# Patient Record
Sex: Female | Born: 1935 | Race: White | Hispanic: No | Smoking: Never smoker
Health system: Southern US, Community
[De-identification: ages and names within clinical notes are randomized; demographics above are authoritative.]

## PROBLEM LIST (undated history)

## (undated) DIAGNOSIS — H269 Unspecified cataract: Secondary | ICD-10-CM

## (undated) DIAGNOSIS — I10 Essential (primary) hypertension: Secondary | ICD-10-CM

## (undated) DIAGNOSIS — T7840XA Allergy, unspecified, initial encounter: Secondary | ICD-10-CM

## (undated) DIAGNOSIS — I2510 Atherosclerotic heart disease of native coronary artery without angina pectoris: Secondary | ICD-10-CM

## (undated) DIAGNOSIS — M810 Age-related osteoporosis without current pathological fracture: Secondary | ICD-10-CM

## (undated) DIAGNOSIS — R011 Cardiac murmur, unspecified: Secondary | ICD-10-CM

## (undated) DIAGNOSIS — C801 Malignant (primary) neoplasm, unspecified: Secondary | ICD-10-CM

## (undated) DIAGNOSIS — M1990 Unspecified osteoarthritis, unspecified site: Secondary | ICD-10-CM

## (undated) DIAGNOSIS — I219 Acute myocardial infarction, unspecified: Secondary | ICD-10-CM

## (undated) HISTORY — DX: Unspecified cataract: H26.9

## (undated) HISTORY — PX: SMALL INTESTINE SURGERY: SHX150

## (undated) HISTORY — DX: Cardiac murmur, unspecified: R01.1

## (undated) HISTORY — DX: Allergy, unspecified, initial encounter: T78.40XA

## (undated) HISTORY — DX: Acute myocardial infarction, unspecified: I21.9

## (undated) HISTORY — PX: FRACTURE SURGERY: SHX138

## (undated) HISTORY — DX: Malignant (primary) neoplasm, unspecified: C80.1

## (undated) HISTORY — DX: Unspecified osteoarthritis, unspecified site: M19.90

## (undated) HISTORY — DX: Age-related osteoporosis without current pathological fracture: M81.0

## (undated) HISTORY — PX: COLON SURGERY: SHX602

---

## 1997-07-02 HISTORY — PX: ANKLE FRACTURE SURGERY: SHX122

## 2010-12-19 DIAGNOSIS — N814 Uterovaginal prolapse, unspecified: Secondary | ICD-10-CM | POA: Insufficient documentation

## 2010-12-19 DIAGNOSIS — D126 Benign neoplasm of colon, unspecified: Secondary | ICD-10-CM | POA: Insufficient documentation

## 2010-12-19 DIAGNOSIS — M159 Polyosteoarthritis, unspecified: Secondary | ICD-10-CM | POA: Insufficient documentation

## 2013-02-04 DIAGNOSIS — M810 Age-related osteoporosis without current pathological fracture: Secondary | ICD-10-CM | POA: Insufficient documentation

## 2017-10-13 ENCOUNTER — Encounter: Payer: Self-pay | Admitting: Emergency Medicine

## 2017-10-13 ENCOUNTER — Other Ambulatory Visit: Payer: Self-pay

## 2017-10-13 ENCOUNTER — Emergency Department
Admission: EM | Admit: 2017-10-13 | Discharge: 2017-10-13 | Disposition: A | Discharge status: Home / Self Care | Payer: Medicare Other | Attending: Emergency Medicine | Admitting: Emergency Medicine

## 2017-10-13 ENCOUNTER — Emergency Department: Payer: Medicare Other

## 2017-10-13 DIAGNOSIS — W208XXA Other cause of strike by thrown, projected or falling object, initial encounter: Secondary | ICD-10-CM | POA: Insufficient documentation

## 2017-10-13 DIAGNOSIS — Y92513 Shop (commercial) as the place of occurrence of the external cause: Secondary | ICD-10-CM | POA: Diagnosis not present

## 2017-10-13 DIAGNOSIS — S81812A Laceration without foreign body, left lower leg, initial encounter: Secondary | ICD-10-CM | POA: Diagnosis not present

## 2017-10-13 DIAGNOSIS — Y9389 Activity, other specified: Secondary | ICD-10-CM | POA: Diagnosis not present

## 2017-10-13 DIAGNOSIS — Z23 Encounter for immunization: Secondary | ICD-10-CM | POA: Insufficient documentation

## 2017-10-13 DIAGNOSIS — Y999 Unspecified external cause status: Secondary | ICD-10-CM | POA: Insufficient documentation

## 2017-10-13 MED ORDER — LIDOCAINE HCL (PF) 1 % IJ SOLN
INTRAMUSCULAR | Status: AC
  Administered 2017-10-13: 5 mL
  Filled 2017-10-13: qty 10

## 2017-10-13 MED ORDER — TETANUS-DIPHTH-ACELL PERTUSSIS 5-2.5-18.5 LF-MCG/0.5 IM SUSP
0.5000 mL | Freq: Once | INTRAMUSCULAR | Status: AC
  Administered 2017-10-13: 0.5 mL via INTRAMUSCULAR
  Filled 2017-10-13: qty 0.5

## 2017-10-13 NOTE — ED Notes (Signed)
CMS intact to left distal extremity, pt has hx of left ankle break with hardware implanted for repair, pt reports left ankle is typically swollen s/p fracture repair

## 2017-10-13 NOTE — ED Triage Notes (Signed)
Patient presents to Emergency Department via [facility] from [employer] with complaints of laceration to left lower medial aspect of shin.  Per pt: as pt was retrieving a printer from a upper shelf the box fell onto pt causing the laceration, pt denies fall, strike to head or LOC.  Pt reports taking dailey low dose ASA.  Pt is alert and oriented x 4, denies pain and NAD.

## 2017-10-13 NOTE — Discharge Instructions (Signed)
Do not get the sutured area wet for 24 hours. After 24 hours, shower/bathe as usual and pat the area dry. Change the bandage 2 times per day and apply antibiotic ointment. Leave open to air when at no risk of getting the area dirty, but cover at night before bed.   

## 2017-10-13 NOTE — ED Notes (Signed)
Pt with left shin bandaged and has 4x4s with ACE wrap applied, bleeding controlled

## 2017-10-13 NOTE — ED Provider Notes (Signed)
[facility] Emergency Department Provider Note  ____________________________________________  Time seen: Approximately 10:02 PM  I have reviewed the triage vital signs and the nursing notes.   HISTORY  Chief Complaint Extremity Laceration   HPI Alexis Perez is a 84 y.o. female who presents to the emergency department for treatment and evaluation of a laceration to the left pretibial area that she sustained while attempting to get a printer off the shelf at [employer]. She states it slipped out of her hand and slid down her leg, causing the laceration. She has a history of ankle fracture with ORIF and ankle is more swollen than usual after the injury. She is able to bear weight.   History reviewed. No pertinent past medical history.  There are no active problems to display for this patient.   Past Surgical History:  Procedure Laterality Date  . ANKLE FRACTURE SURGERY Left 1999    Prior to Admission medications   Not on File    Allergies Fentanyl and Sodium pentobarbital [pentobarbital]  History reviewed. No pertinent family history.  Social History Social History   Tobacco Use  . Smoking status: Never Smoker  . Smokeless tobacco: Never Used  Substance Use Topics  . Alcohol use: Never    Frequency: Never  . Drug use: Never    Review of Systems  Constitutional: Negative for fever. Respiratory: Negative for cough or shortness of breath.  Musculoskeletal: Negative for myalgias Skin: Positive for laceration. Neurological: Negative for numbness or paresthesias. ____________________________________________   PHYSICAL EXAM:  VITAL SIGNS: ED Triage Vitals  Enc Vitals Group     BP 10/13/17 1950 (!) 173/87     Pulse Rate 10/13/17 1950 81     Resp 10/13/17 1950 18     Temp 10/13/17 1950 (!) 97.5 F (36.4 C)     Temp Source 10/13/17 1950 Oral     SpO2 10/13/17 1950 99 %     Weight 10/13/17 1951 134 lb (60.8 kg)     Height 10/13/17  1951 5\' 2"  (1.575 m)     Head Circumference --      Peak Flow --      Pain Score 10/13/17 1951 0     Pain Loc --      Pain Edu? --      Excl. in GC? --      Constitutional: Well appearing. Eyes: Conjunctivae are clear without discharge or drainage. Nose: No rhinorrhea noted. Mouth/Throat: Airway is patent.  Neck: No stridor. Unrestricted range of motion observed.  Cardiovascular: Capillary refill is <3 seconds.  Respiratory: Respirations are even and unlabored.. Musculoskeletal: Unrestricted range of motion observed. Neurologic: Awake, alert, and oriented x 4.  Skin:  8cm laceration to the pretibial area of the left lower leg.  ____________________________________________   LABS (all labs ordered are listed, but only abnormal results are displayed)  Labs Reviewed - No data to display ____________________________________________  EKG  Not indicated. ____________________________________________  RADIOLOGY  Image of the left ankle shows postsurgical changes of the distal fibula, otherwise no acute bony abnormality.  There is some soft tissue swelling as well per radiology. ____________________________________________   PROCEDURES  .Marland Kitchen.Laceration Repair Date/Time: 10/13/2017 10:07 PM Performed by: Chinita Pesterriplett, Vera Furniss B, FNP Authorized by: Chinita Pesterriplett, Macari Zalesky B, FNP   Consent:    Consent obtained:  Verbal   Consent given by:  Patient   Risks discussed:  Infection, pain, retained foreign body, poor cosmetic result and poor wound healing Anesthesia (see MAR for exact dosages):  Anesthesia method:  Local infiltration   Local anesthetic:  Lidocaine 1% w/o epi Laceration details:    Location:  Leg   Leg location:  L lower leg   Length (cm):  8 Repair type:    Repair type:  Complex Pre-procedure details:    Preparation:  Patient was prepped and draped in usual sterile fashion Exploration:    Hemostasis achieved with:  Direct pressure   Wound exploration: entire depth of  wound probed and visualized     Contaminated: no   Treatment:    Area cleansed with:  Saline and Betadine   Amount of cleaning:  Extensive   Irrigation solution:  Sterile saline   Irrigation volume:    Irrigation method:  Syringe   Visualized foreign bodies/material removed: no     Debridement:  Minimal Subcutaneous repair:    Suture size:  4-0   Suture technique:  Simple interrupted   Number of sutures:  6 Skin repair:    Repair method:  Sutures   Suture size:  4-0   Suture technique:  Simple interrupted   Number of sutures:  14 Approximation:    Approximation:  Close Post-procedure details:    Dressing:  Sterile dressing and bulky dressing   Patient tolerance of procedure:  Tolerated well, no immediate complications Comments:     Light compression applied to the sutured area via [place]   ____________________________________________   INITIAL IMPRESSION / ASSESSMENT AND PLAN / ED COURSE  Alexis Perez is a 84 y.o. female who presents to the emergency department for treatment and evaluation after sustaining a laceration to the left pretibial area just prior to arrival.  Images of the left ankle were negative for acute bony abnormality.  Multilayer repair performed with good cosmetic results. No active bleeding post procedure, however area bleeds easily with movement or pressure. A light compression dressing applied. Granddaughter and patient were given specific wound care instructions. Sutures to be removed in about 12 days by primary care provider. She was advised to return to the ER for symptoms of concern if unable to see her PCP.  Medications  Tdap (BOOSTRIX) injection 0.5 mL (has no administration in time range)  lidocaine (PF) (XYLOCAINE) 1 % injection (has no administration in time range)     Pertinent labs & imaging results that were available during my care of the patient were reviewed by me and considered in my medical decision making (see chart for  details). ____________________________________________   FINAL CLINICAL IMPRESSION(S) / ED DIAGNOSES  Final diagnoses:  Laceration of left lower extremity, initial encounter    ED Discharge Orders    None       Note:  This document was prepared using Dragon voice recognition software and may include unintentional dictation errors.    Chinita Pester, FNP 10/13/17 2213    Arnaldo Natal, MD 10/13/17 [phone]

## 2018-08-21 ENCOUNTER — Encounter: Payer: Self-pay | Admitting: Emergency Medicine

## 2018-08-21 ENCOUNTER — Other Ambulatory Visit: Payer: Self-pay

## 2018-08-21 ENCOUNTER — Ambulatory Visit
Admission: EM | Admit: 2018-08-21 | Discharge: 2018-08-21 | Disposition: A | Discharge status: Home / Self Care | Payer: Medicare Other | Attending: Emergency Medicine | Admitting: Emergency Medicine

## 2018-08-21 DIAGNOSIS — R319 Hematuria, unspecified: Secondary | ICD-10-CM

## 2018-08-21 DIAGNOSIS — I10 Essential (primary) hypertension: Secondary | ICD-10-CM | POA: Diagnosis not present

## 2018-08-21 DIAGNOSIS — N390 Urinary tract infection, site not specified: Secondary | ICD-10-CM | POA: Diagnosis not present

## 2018-08-21 HISTORY — DX: Essential (primary) hypertension: I10

## 2018-08-21 LAB — URINALYSIS, COMPLETE (UACMP) WITH MICROSCOPIC
GLUCOSE, UA: NEGATIVE mg/dL
Ketones, ur: NEGATIVE mg/dL
Nitrite: POSITIVE — AB
PH: 6 (ref 5.0–8.0)
Protein, ur: >300 mg/dL — AB
SPECIFIC GRAVITY, URINE: 1.03 (ref 1.005–1.030)
Squamous Epithelial / LPF: NONE SEEN (ref 0–5)
WBC, UA: >50 WBC/hpf (ref 0–5)

## 2018-08-21 MED ORDER — CIPROFLOXACIN HCL 500 MG PO TABS: 500.0000 mg | ORAL_TABLET | Freq: Two times a day (BID) | ORAL | 0 refills | Status: AC

## 2018-08-21 NOTE — ED Provider Notes (Signed)
[facility] ____________________________________________  Time seen: Approximately 11:07 AM  I have reviewed the triage vital signs and the nursing notes.   HISTORY  Chief Complaint Urinary Frequency and Urinary Urgency   HPI Alexis Perez is a 85 y.o. female presenting for evaluation of urinary frequency and urinary urgency present for the last several days.  Patient reports that she does not often get urinary tract infections, but states that she did have one in January that was treated.  Reports that she was treated with Keflex 3 times a day for 7 days, and states that her symptoms did resolve, but returned.  Denies known trigger.  Denies any changes.  Continues to eat and drink well.  Denies any incontinence.  Normal bowel habits.  Denies accompanying abdominal pain, back pain, burning with urination or vaginal complaints.  No recent fevers.  Denies chest pain or shortness of breath.  Reports otherwise doing well.  Denies other recent sickness.  Denies aggravating or alleviating factors.  Denies renal insufficiency.  Atha Starks, MD: PCP   Past Medical History:  Diagnosis Date  . Hypertension     There are no active problems to display for this patient.   Past Surgical History:  Procedure Laterality Date  . ANKLE FRACTURE SURGERY Left 1999     No current facility-administered medications for this encounter.   Current Outpatient Medications:  .  amLODipine (NORVASC) 5 MG tablet, Take by mouth., Disp: , Rfl:  .  atenolol (TENORMIN) 100 MG tablet, TAKE 1 TABLET (100 MG TOTAL) BY MOUTH DAILY, Disp: , Rfl:  .  diclofenac sodium (VOLTAREN) 1 % GEL, Apply topically., Disp: , Rfl:  .  losartan (COZAAR) 100 MG tablet, Take by mouth., Disp: , Rfl:  .  Calcium-Vitamin D-Vitamin K 500-100-40 MG-UNT-MCG CHEW, Chew by mouth., Disp: , Rfl:  .  ciprofloxacin (CIPRO) 500 MG tablet, Take 1 tablet (500 mg total) by mouth 2 (two) times daily for 7 days., Disp: 14 tablet, Rfl:  0 .  meloxicam (MOBIC) 7.5 MG tablet, Take by mouth., Disp: , Rfl:   Allergies Fentanyl and Sodium pentobarbital [pentobarbital]  History reviewed. No pertinent family history.  Social History Social History   Tobacco Use  . Smoking status: Never Smoker  . Smokeless tobacco: Never Used  Substance Use Topics  . Alcohol use: Never    Frequency: Never  . Drug use: Never    Review of Systems Constitutional: No fever Cardiovascular: Denies chest pain. Respiratory: Denies shortness of breath. Gastrointestinal: No abdominal pain.  No nausea, no vomiting.  No diarrhea.  Genitourinary: positive for dysuria. Musculoskeletal: Negative for back pain. Skin: Negative for rash.   ____________________________________________   PHYSICAL EXAM:  VITAL SIGNS: ED Triage Vitals  Enc Vitals Group     BP 08/21/18 1027 (!) 144/75     Pulse Rate 08/21/18 1027 63     Resp 08/21/18 1027 18     Temp 08/21/18 1027 98.5 F (36.9 C)     Temp Source 08/21/18 1027 Oral     SpO2 08/21/18 1027 99 %     Weight 08/21/18 1025 135 lb (61.2 kg)     Height 08/21/18 1025 5\' 2"  (1.575 m)     Head Circumference --      Peak Flow --      Pain Score 08/21/18 1024 0     Pain Loc --      Pain Edu? --      Excl. in GC? --  Constitutional: Alert and oriented. Well appearing and in no acute distress. ENT      Head: Normocephalic and atraumatic. Cardiovascular: Normal rate, regular rhythm. Grossly normal heart sounds.  Good peripheral circulation. Respiratory: Normal respiratory effort without tachypnea nor retractions. Breath sounds are clear and equal bilaterally. No wheezes, rales, rhonchi. Gastrointestinal: Soft and nontender. No CVA tenderness. Musculoskeletal:No midline cervical, thoracic or lumbar tenderness to palpation. Neurologic:  Normal speech and language. Speech is normal. No gait instability.  Skin:  Skin is warm, dry and intact. No rash noted. Psychiatric: Mood and affect are normal.  Speech and behavior are normal. Patient exhibits appropriate insight and judgment   ___________________________________________   LABS (all labs ordered are listed, but only abnormal results are displayed)  Labs Reviewed  URINALYSIS, COMPLETE (UACMP) WITH MICROSCOPIC - Abnormal; Notable for the following components:      Result Value   APPearance CLOUDY (*)    Hgb urine dipstick LARGE (*)    Bilirubin Urine SMALL (*)    Protein, ur >300 (*)    Nitrite POSITIVE (*)    Leukocytes,Ua LARGE (*)    Bacteria, UA MANY (*)    All other components within normal limits  URINE CULTURE    Via care everywhere most recent labs in September 2019 reviewed with a creatinine of 0.57.  Creatinine clearance calculated at 61.   PROCEDURES Procedures    INITIAL IMPRESSION / ASSESSMENT AND PLAN / ED COURSE  Pertinent labs & imaging results that were available during my care of the patient were reviewed by me and considered in my medical decision making (see chart for details).  Well-appearing patient.  No acute distress.  Urinalysis reviewed, positive UTI.  Patient recently treated approximately 1 month ago with Keflex.  Creatinine clearance calculated and 61.  Will treat patient with oral Cipro twice daily x7 days.  We will also await urine culture.  Recommend for follow-up with primary care in 7 to 10 days for recheck of urine to ensure clearance as well as discussed sooner evaluation for no improvement or worsening concerns.  Fluids, supportive care. Discussed indication, risks and benefits of medications with patient.  Discussed follow up with Primary care physician this week. Discussed follow up and return parameters including no resolution or any worsening concerns. Patient verbalized understanding and agreed to plan.   ____________________________________________   FINAL CLINICAL IMPRESSION(S) / ED DIAGNOSES  Final diagnoses:  Urinary tract infection with hematuria, site unspecified      ED Discharge Orders         Ordered    ciprofloxacin (CIPRO) 500 MG tablet  2 times daily     08/21/18 1144           Note: This dictation was prepared with Dragon dictation along with smaller phrase technology. Any transcriptional errors that result from this process are unintentional.     Renford Dills, NP 08/21/18 1221

## 2018-08-21 NOTE — Discharge Instructions (Addendum)
Take medication as prescribed. Rest. Drink plenty of fluids.   Follow up with your primary care physician in one week. Sooner if not better.    Return to Urgent care for new or worsening concerns.

## 2018-08-21 NOTE — ED Triage Notes (Signed)
Patient c/o urinary frequency and urgency that started yesterday.  

## 2018-08-23 LAB — URINE CULTURE: Culture: >=100000 — AB

## 2018-08-25 ENCOUNTER — Telehealth (HOSPITAL_COMMUNITY): Payer: Self-pay | Admitting: Emergency Medicine

## 2018-08-25 NOTE — Telephone Encounter (Signed)
Urine culture was positive for Escherichia coli and was given cipro  at urgent care visit. Pt contacted and made aware, educated on completing antibiotic and to follow up if symptoms are persistent. Attempted call an

## 2021-06-24 DIAGNOSIS — E785 Hyperlipidemia, unspecified: Secondary | ICD-10-CM | POA: Insufficient documentation

## 2021-09-01 ENCOUNTER — Encounter: Payer: Medicare PPO | Attending: Internal Medicine | Admitting: *Deleted

## 2021-09-01 ENCOUNTER — Other Ambulatory Visit: Payer: Self-pay

## 2021-09-01 ENCOUNTER — Encounter: Payer: Self-pay | Admitting: *Deleted

## 2021-09-01 DIAGNOSIS — I214 Non-ST elevation (NSTEMI) myocardial infarction: Secondary | ICD-10-CM | POA: Insufficient documentation

## 2021-09-01 NOTE — Progress Notes (Signed)
Virtual orientation call completed today. shehas an appointment on Date: 09/11/2021  for EP eval and gym Orientation.  Documentation of diagnosis can be found in [facility]  Date: 08/09/2021 and 06/23/2021 .  ? ?

## 2021-09-11 ENCOUNTER — Other Ambulatory Visit: Payer: Self-pay

## 2021-09-11 VITALS — Ht 62.0 in | Wt 133.6 lb

## 2021-09-11 DIAGNOSIS — I214 Non-ST elevation (NSTEMI) myocardial infarction: Secondary | ICD-10-CM | POA: Diagnosis present

## 2021-09-11 NOTE — Patient Instructions (Addendum)
Patient Instructions ? ?Patient Details  ?Name: Alexis Perez ?MRN: [id number] ?Date of Birth: 10/16/1923 ?Referring Provider:  Christain SacramentoMoskowitz, Adam J, MD ? ?Below are your personal goals for exercise, nutrition, and risk factors. Our goal is to help you stay on track towards obtaining and maintaining these goals. We will be discussing your progress on these goals with you throughout the program. ? ?Initial Exercise Prescription: ? Initial Exercise Prescription - 09/11/21 1600   ? ?  ? Date of Initial Exercise RX and Referring Provider  ? Date 09/11/21   ? Referring Provider Cecile HearingMoskowitz   ?  ? Oxygen  ? Maintain Oxygen Saturation 88% or higher   ?  ? Treadmill  ? MPH 0.8   ? Grade 0   ? Minutes 15   ? METs 1.4   ?  ? REL-XR  ? Level 1   ? Speed 50   ? Minutes 15   ? METs 1.5   ?  ? T5 Nustep  ? Level 1   ? SPM 80   ? Minutes 15   ? METs 1.5   ?  ? Track  ? Laps 10   ? Minutes 15   ? METs 1.5   ?  ? Prescription Details  ? Frequency (times per week) 3   ? Duration Progress to 30 minutes of continuous aerobic without signs/symptoms of physical distress   ?  ? Intensity  ? THRR 40-80% of Max Heartrate 101-117   ? Ratings of Perceived Exertion 11-13   ? Perceived Dyspnea 0-4   ?  ? Resistance Training  ? Training Prescription Yes   ? Weight 3 lb   ? Reps 10-15   ? ?  ?  ? ?  ? ? ?Exercise Goals: ?Frequency: Be able to perform aerobic exercise two to three times per week in program working toward 2-5 days per week of home exercise. ? ?Intensity: Work with a perceived exertion of 11 (fairly light) - 15 (hard) while following your exercise prescription.  We will make changes to your prescription with you as you progress through the program. ?  ?Duration: Be able to do 30 to 45 minutes of continuous aerobic exercise in addition to a 5 minute warm-up and a 5 minute cool-down routine. ?  ?Nutrition Goals: ?Your personal nutrition goals will be established when you do your nutrition analysis with the dietician. ? ?The following are  general nutrition guidelines to follow: ?Cholesterol < 200mg /day ?Sodium < 1500mg /day ?Fiber: Women over 50 yrs - 21 grams per day ? ?Personal Goals: ? Personal Goals and Risk Factors at Admission - 09/11/21 1632   ? ?  ? Core Components/Risk Factors/Patient Goals on Admission  ?  Weight Management Yes   ? Intervention Weight Management: Develop a combined nutrition and exercise program designed to reach desired caloric intake, while maintaining appropriate intake of nutrient and fiber, sodium and fats, and appropriate energy expenditure required for the weight goal.;Weight Management: Provide education and appropriate resources to help participant work on and attain dietary goals.   ? Admit Weight 133 lb (60.3 kg)   ? Goal Weight: Long Term 128 lb (58.1 kg)   ? Expected Outcomes Short Term: Continue to assess and modify interventions until short term weight is achieved;Long Term: Adherence to nutrition and physical activity/exercise program aimed toward attainment of established weight goal;Weight Maintenance: Understanding of the daily nutrition guidelines, which includes 25-35% calories from fat, 7% or less cal from saturated fats, less than 200mg  cholesterol,  less than 1.5gm of sodium, & 5 or more servings of fruits and vegetables daily   ? Hypertension Yes   ? Intervention Provide education on lifestyle modifcations including regular physical activity/exercise, weight management, moderate sodium restriction and increased consumption of fresh fruit, vegetables, and low fat dairy, alcohol moderation, and smoking cessation.;Monitor prescription use compliance.   ? Expected Outcomes Short Term: Continued assessment and intervention until BP is < 140/69mm HG in hypertensive participants. < 130/80mm HG in hypertensive participants with diabetes, heart failure or chronic kidney disease.;Long Term: Maintenance of blood pressure at goal levels.   ? Lipids Yes   ? Intervention Provide education and support for  participant on nutrition & aerobic/resistive exercise along with prescribed medications to achieve LDL 70mg , HDL >40mg .   ? Expected Outcomes Short Term: Participant states understanding of desired cholesterol values and is compliant with medications prescribed. Participant is following exercise prescription and nutrition guidelines.;Long Term: Cholesterol controlled with medications as prescribed, with individualized exercise RX and with personalized nutrition plan. Value goals: LDL < 70mg , HDL > 40 mg.   ? ?  ?  ? ?  ? ? ?Tobacco Use Initial Evaluation: ?Social History  ? ?Tobacco Use  ?Smoking Status Never  ?Smokeless Tobacco Never  ? ? ?Exercise Goals and Review: ? Exercise Goals   ? ? Row Name 09/11/21 1634  ?  ?  ?  ?  ?  ? Exercise Goals  ? Increase Physical Activity Yes      ? Intervention Provide advice, education, support and counseling about physical activity/exercise needs.;Develop an individualized exercise prescription for aerobic and resistive training based on initial evaluation findings, risk stratification, comorbidities and participant's personal goals.      ? Expected Outcomes Short Term: Attend rehab on a regular basis to increase amount of physical activity.;Long Term: Add in home exercise to make exercise part of routine and to increase amount of physical activity.;Long Term: Exercising regularly at least 3-5 days a week.      ? Increase Strength and Stamina Yes      ? Intervention Provide advice, education, support and counseling about physical activity/exercise needs.;Develop an individualized exercise prescription for aerobic and resistive training based on initial evaluation findings, risk stratification, comorbidities and participant's personal goals.      ? Expected Outcomes Short Term: Increase workloads from initial exercise prescription for resistance, speed, and METs.;Short Term: Perform resistance training exercises routinely during rehab and add in resistance training at home;Long  Term: Improve cardiorespiratory fitness, muscular endurance and strength as measured by increased METs and functional capacity (09/13/21)      ? Able to understand and use rate of perceived exertion (RPE) scale Yes      ? Intervention Provide education and explanation on how to use RPE scale      ? Expected Outcomes Short Term: Able to use RPE daily in rehab to express subjective intensity level;Long Term:  Able to use RPE to guide intensity level when exercising independently      ? Able to understand and use Dyspnea scale Yes      ? Intervention Provide education and explanation on how to use Dyspnea scale      ? Expected Outcomes Short Term: Able to use Dyspnea scale daily in rehab to express subjective sense of shortness of breath during exertion;Long Term: Able to use Dyspnea scale to guide intensity level when exercising independently      ? Knowledge and understanding of Target Heart Rate Range (THRR) Yes      ?  Intervention Provide education and explanation of THRR including how the numbers were predicted and where they are located for reference      ? Expected Outcomes Short Term: Able to state/look up THRR;Short Term: Able to use daily as guideline for intensity in rehab;Long Term: Able to use THRR to govern intensity when exercising independently      ? Able to check pulse independently Yes      ? Intervention Provide education and demonstration on how to check pulse in carotid and radial arteries.;Review the importance of being able to check your own pulse for safety during independent exercise      ? Expected Outcomes Short Term: Able to explain why pulse checking is important during independent exercise;Long Term: Able to check pulse independently and accurately      ? Understanding of Exercise Prescription Yes      ? Intervention Provide education, explanation, and written materials on patient's individual exercise prescription      ? Expected Outcomes Short Term: Able to explain program exercise  prescription;Long Term: Able to explain home exercise prescription to exercise independently      ? ?  ?  ? ?  ? ? ?Copy of goals given to participant. ?

## 2021-09-11 NOTE — Progress Notes (Signed)
Cardiac Individual Treatment Plan  Patient Details  Name: Alexis Perez MRN: [id number] Date of Birth: 1923/09/30 Referring Provider:   Flowsheet Row Cardiac Rehab from 09/11/2021 in [facility] Cardiac and Pulmonary Rehab  Referring Provider [name]       Initial Encounter Date:  Flowsheet Row Cardiac Rehab from 09/11/2021 in [facility] Cardiac and Pulmonary Rehab  Date 09/11/21       Visit Diagnosis: NSTEMI (non-ST elevation myocardial infarction) [facility])  Patient's Home Medications on Admission:  Current Outpatient Medications:    amLODipine (NORVASC) 10 MG tablet, Take by mouth., Disp: , Rfl:    aspirin EC 81 MG tablet, Take 81 mg by mouth daily. Swallow whole., Disp: , Rfl:    Calcium-Vitamin D-Vitamin K 500-100-40 MG-UNT-MCG CHEW, Chew by mouth., Disp: , Rfl:    Cholecalciferol 50 MCG (2000 UT) CAPS, Take by mouth., Disp: , Rfl:    clopidogrel (PLAVIX) 75 MG tablet, Take by mouth., Disp: , Rfl:    diclofenac Sodium (VOLTAREN) 1 % GEL, Apply topically., Disp: , Rfl:    ezetimibe (ZETIA) 10 MG tablet, Take by mouth., Disp: , Rfl:    isosorbide mononitrate (IMDUR) 30 MG 24 hr tablet, Take by mouth., Disp: , Rfl:    losartan (COZAAR) 100 MG tablet, Take by mouth. (Patient not taking: Reported on 09/01/2021), Disp: , Rfl:    meloxicam (MOBIC) 7.5 MG tablet, Take by mouth. (Patient not taking: Reported on 09/01/2021), Disp: , Rfl:    metoprolol succinate (TOPROL-XL) 50 MG 24 hr tablet, Take by mouth., Disp: , Rfl:    Multiple Vitamins-Minerals (PRESERVISION AREDS 2) CAPS, daily., Disp: , Rfl:    nitroGLYCERIN (NITROSTAT) 0.4 MG SL tablet, Place under the tongue., Disp: , Rfl:    Polyethyl Glycol-Propyl Glycol 0.4-0.3 % SOLN, Administer 1 drop to both eyes in the morning., Disp: , Rfl:   Past Medical History: Past Medical History:  Diagnosis Date   Hypertension     Tobacco Use: Social History   Tobacco Use  Smoking Status Never  Smokeless Tobacco Never    Labs: Recent Review Flowsheet  Data   There is no flowsheet data to display.      Exercise Target Goals: Exercise Program Goal: Individual exercise prescription set using results from initial 6 min walk test and THRR while considering  patients activity barriers and safety.   Exercise Prescription Goal: Initial exercise prescription builds to 30-45 minutes a day of aerobic activity, 2-3 days per week.  Home exercise guidelines will be given to patient during program as part of exercise prescription that the participant will acknowledge.   Education: Aerobic Exercise: - Group verbal and visual presentation on the components of exercise prescription. Introduces F.I.T.T principle from ACSM for exercise prescriptions.  Reviews F.I.T.T. principles of aerobic exercise including progression. Written material given at graduation.   Education: Resistance Exercise: - Group verbal and visual presentation on the components of exercise prescription. Introduces F.I.T.T principle from ACSM for exercise prescriptions  Reviews F.I.T.T. principles of resistance exercise including progression. Written material given at graduation.    Education: Exercise & Equipment Safety: - Individual verbal instruction and demonstration of equipment use and safety with use of the equipment. Flowsheet Row Cardiac Rehab from 09/11/2021 in [facility] Cardiac and Pulmonary Rehab  Date 09/11/21  Educator AS  Instruction Review Code 1- Verbalizes Understanding       Education: Exercise Physiology & General Exercise Guidelines: - Group verbal and written instruction with models to review the exercise physiology of the cardiovascular system and  associated critical values. Provides general exercise guidelines with specific guidelines to those with heart or lung disease.    Education: Flexibility, Balance, Mind/Body Relaxation: - Group verbal and visual presentation with interactive activity on the components of exercise prescription. Introduces F.I.T.T  principle from ACSM for exercise prescriptions. Reviews F.I.T.T. principles of flexibility and balance exercise training including progression. Also discusses the mind body connection.  Reviews various relaxation techniques to help reduce and manage stress (i.e. Deep breathing, progressive muscle relaxation, and visualization). Balance handout provided to take home. Written material given at graduation.   Activity Barriers & Risk Stratification:  Activity Barriers & Cardiac Risk Stratification - 09/01/21 1014       Activity Barriers & Cardiac Risk Stratification   Activity Barriers Assistive Device;Arthritis;Chest Pain/Angina    Cardiac Risk Stratification Moderate             6 Minute Walk:  6 Minute Walk     Row Name 09/11/21 1624         6 Minute Walk   Phase Initial     Distance 445 feet     Walk Time 5.75 minutes     # of Rest Breaks 1     MPH 0.9     METS 1.5     RPE 11     Perceived Dyspnea  0     VO2 Peak 0.1     Symptoms No     Resting HR 85 bpm     Resting BP 110/56     Resting Oxygen Saturation  99 %     Exercise Oxygen Saturation  during 6 min walk 99 %     Max Ex. HR 95 bpm     Max Ex. BP 128/64     2 Minute Post BP 112/64              Oxygen Initial Assessment:   Oxygen Re-Evaluation:   Oxygen Discharge (Final Oxygen Re-Evaluation):   Initial Exercise Prescription:  Initial Exercise Prescription - 09/11/21 1600       Date of Initial Exercise RX and Referring Provider   Date 09/11/21    Referring Provider Moskowitz      Oxygen   Maintain Oxygen Saturation 88% or higher      Treadmill   MPH 0.8    Grade 0    Minutes 15    METs 1.4      REL-XR   Level 1    Speed 50    Minutes 15    METs 1.5      T5 Nustep   Level 1    SPM 80    Minutes 15    METs 1.5      Track   Laps 10    Minutes 15    METs 1.5      Prescription Details   Frequency (times per week) 3    Duration Progress to 30 minutes of continuous aerobic  without signs/symptoms of physical distress      Intensity   THRR 40-80% of Max Heartrate 101-117    Ratings of Perceived Exertion 11-13    Perceived Dyspnea 0-4      Resistance Training   Training Prescription Yes    Weight 3 lb    Reps 10-15             Perform Capillary Blood Glucose checks as needed.  Exercise Prescription Changes:   Exercise Prescription Changes     Row Name 09/11/21 1600  Response to Exercise   Blood Pressure (Admit) 110/56       Blood Pressure (Exercise) 128/64       Blood Pressure (Exit) 112/64       Heart Rate (Admit) 85 bpm       Heart Rate (Exercise) 95 bpm       Heart Rate (Exit) 89 bpm       Oxygen Saturation (Admit) 99 %       Oxygen Saturation (Exercise) 99 %       Rating of Perceived Exertion (Exercise) 11       Perceived Dyspnea (Exercise) 0       Symptoms none                Exercise Comments:   Exercise Goals and Review:   Exercise Goals     Row Name 09/11/21 1634             Exercise Goals   Increase Physical Activity Yes       Intervention Provide advice, education, support and counseling about physical activity/exercise needs.;Develop an individualized exercise prescription for aerobic and resistive training based on initial evaluation findings, risk stratification, comorbidities and participant's personal goals.       Expected Outcomes Short Term: Attend rehab on a regular basis to increase amount of physical activity.;Long Term: Add in home exercise to make exercise part of routine and to increase amount of physical activity.;Long Term: Exercising regularly at least 3-5 days a week.       Increase Strength and Stamina Yes       Intervention Provide advice, education, support and counseling about physical activity/exercise needs.;Develop an individualized exercise prescription for aerobic and resistive training based on initial evaluation findings, risk stratification, comorbidities and participant's  personal goals.       Expected Outcomes Short Term: Increase workloads from initial exercise prescription for resistance, speed, and METs.;Short Term: Perform resistance training exercises routinely during rehab and add in resistance training at home;Long Term: Improve cardiorespiratory fitness, muscular endurance and strength as measured by increased METs and functional capacity ( )       Able to understand and use rate of perceived exertion (RPE) scale Yes       Intervention Provide education and explanation on how to use RPE scale       Expected Outcomes Short Term: Able to use RPE daily in rehab to express subjective intensity level;Long Term:  Able to use RPE to guide intensity level when exercising independently       Able to understand and use Dyspnea scale Yes       Intervention Provide education and explanation on how to use Dyspnea scale       Expected Outcomes Short Term: Able to use Dyspnea scale daily in rehab to express subjective sense of shortness of breath during exertion;Long Term: Able to use Dyspnea scale to guide intensity level when exercising independently       Knowledge and understanding of Target Heart Rate Range (THRR) Yes       Intervention Provide education and explanation of THRR including how the numbers were predicted and where they are located for reference       Expected Outcomes Short Term: Able to state/look up THRR;Short Term: Able to use daily as guideline for intensity in rehab;Long Term: Able to use THRR to govern intensity when exercising independently       Able to check pulse independently Yes       Intervention Provide  education and demonstration on how to check pulse in carotid and radial arteries.;Review the importance of being able to check your own pulse for safety during independent exercise       Expected Outcomes Short Term: Able to explain why pulse checking is important during independent exercise;Long Term: Able to check pulse independently and  accurately       Understanding of Exercise Prescription Yes       Intervention Provide education, explanation, and written materials on patient's individual exercise prescription       Expected Outcomes Short Term: Able to explain program exercise prescription;Long Term: Able to explain home exercise prescription to exercise independently                Exercise Goals Re-Evaluation :   Discharge Exercise Prescription (Final Exercise Prescription Changes):  Exercise Prescription Changes - 09/11/21 1600       Response to Exercise   Blood Pressure (Admit) 110/56    Blood Pressure (Exercise) 128/64    Blood Pressure (Exit) 112/64    Heart Rate (Admit) 85 bpm    Heart Rate (Exercise) 95 bpm    Heart Rate (Exit) 89 bpm    Oxygen Saturation (Admit) 99 %    Oxygen Saturation (Exercise) 99 %    Rating of Perceived Exertion (Exercise) 11    Perceived Dyspnea (Exercise) 0    Symptoms none             Nutrition:  Target Goals: Understanding of nutrition guidelines, daily intake of sodium 1500mg , cholesterol 200mg , calories 30% from fat and 7% or less from saturated fats, daily to have 5 or more servings of fruits and vegetables.  Education: All About Nutrition: -Group instruction provided by verbal, written material, interactive activities, discussions, models, and posters to present general guidelines for heart healthy nutrition including fat, fiber, MyPlate, the role of sodium in heart healthy nutrition, utilization of the nutrition label, and utilization of this knowledge for meal planning. Follow up email sent as well. Written material given at graduation.   Biometrics:  Pre Biometrics - 09/11/21 1632       Pre Biometrics   Height  (1.575 m)    Weight 133 lb 9.6 oz (60.6 kg)    BMI (Calculated) 24.43    Single Leg Stand 0 seconds              Nutrition Therapy Plan and Nutrition Goals:   Nutrition Assessments:  MEDIFICTS Score Key: ?70 Need to make  dietary changes  40-70 Heart Healthy Diet ? 40 Therapeutic Level Cholesterol Diet  Flowsheet Row Cardiac Rehab from 09/11/2021 in [facility] Cardiac and Pulmonary Rehab  Picture Your Plate Total Score on Admission 82      Picture Your Plate Scores: <16 Unhealthy dietary pattern with much room for improvement. 41-50 Dietary pattern unlikely to meet recommendations for good health and room for improvement. 51-60 More healthful dietary pattern, with some room for improvement.  >60 Healthy dietary pattern, although there may be some specific behaviors that could be improved.    Nutrition Goals Re-Evaluation:   Nutrition Goals Discharge (Final Nutrition Goals Re-Evaluation):   Psychosocial: Target Goals: Acknowledge presence or absence of significant depression and/or stress, maximize coping skills, provide positive support system. Participant is able to verbalize types and ability to use techniques and skills needed for reducing stress and depression.   Education: Stress, Anxiety, and Depression - Group verbal and visual presentation to define topics covered.  Reviews how body is impacted  by stress, anxiety, and depression.  Also discusses healthy ways to reduce stress and to treat/manage anxiety and depression.  Written material given at graduation.   Education: Sleep Hygiene -Provides group verbal and written instruction about how sleep can affect your health.  Define sleep hygiene, discuss sleep cycles and impact of sleep habits. Review good sleep hygiene tips.    Initial Review & Psychosocial Screening:  Initial Psych Review & Screening - 09/01/21 1015       Initial Review   Current issues with None Identified      Family Dynamics   Good Support System? Yes   6 daughters and a son, 3 daughters and a grandson lives with her.     Barriers   Psychosocial barriers to participate in program There are no identifiable barriers or psychosocial needs.;The patient should benefit from  training in stress management and relaxation.      Screening Interventions   Interventions Encouraged to exercise    Expected Outcomes Short Term goal: Utilizing psychosocial counselor, staff and physician to assist with identification of specific Stressors or current issues interfering with healing process. Setting desired goal for each stressor or current issue identified.;Long Term Goal: Stressors or current issues are controlled or eliminated.;Short Term goal: Identification and review with participant of any Quality of Life or Depression concerns found by scoring the questionnaire.;Long Term goal: The participant improves quality of Life and PHQ9 Scores as seen by post scores and/or verbalization of changes             Quality of Life Scores:   Quality of Life - 09/11/21 1622       Quality of Life   Select Quality of Life      Quality of Life Scores   Health/Function Pre 22.23 %    Socioeconomic Pre 24.69 %    Psych/Spiritual Pre 28.21 %    Family Pre 22.9 %    GLOBAL Pre 24.09 %            Scores of 19 and below usually indicate a poorer quality of life in these areas.  A difference of  2-3 points is a clinically meaningful difference.  A difference of 2-3 points in the total score of the Quality of Life Index has been associated with significant improvement in overall quality of life, self-image, physical symptoms, and general health in studies assessing change in quality of life.  PHQ-9: Recent Review Flowsheet Data     Depression screen [facility] 2/9 09/11/2021   Decreased Interest 0   Down, Depressed, Hopeless 0   PHQ - 2 Score 0   Altered sleeping 0   Tired, decreased energy 2   Change in appetite 0   Feeling bad or failure about yourself  0   Trouble concentrating 0   Moving slowly or fidgety/restless 0   Suicidal thoughts 0   PHQ-9 Score 2   Difficult doing work/chores Somewhat difficult      Interpretation of Total Score  Total Score Depression Severity:   1-4 = Minimal depression, 5-9 = Mild depression, 10-14 = Moderate depression, 15-19 = Moderately severe depression, 20-27 = Severe depression   Psychosocial Evaluation and Intervention:  Psychosocial Evaluation - 09/01/21 1023       Psychosocial Evaluation & Interventions   Interventions Encouraged to exercise with the program and follow exercise prescription    Comments Alexis Perez has no barriers to attending the program. She lives with 3 daughters and a grandson. She has 3 more daughters  and a son that are nearby.  They are all there for her as needed. She is looking forward to attending the program.  She has no depression,stress nor anxiety concerns.    Expected Outcomes STG Alexis Perez will attend akk scheduled sessions and progress with her exercise without anginal symptoms.   LTG Alexis Perez will continue with the progression after disccharge    Continue Psychosocial Services  Follow up required by staff             Psychosocial Re-Evaluation:   Psychosocial Discharge (Final Psychosocial Re-Evaluation):   Vocational Rehabilitation: Provide vocational rehab assistance to qualifying candidates.   Vocational Rehab Evaluation & Intervention:   Education: Education Goals: Education classes will be provided on a variety of topics geared toward better understanding of heart health and risk factor modification. Participant will state understanding/return demonstration of topics presented as noted by education test scores.  Learning Barriers/Preferences:   General Cardiac Education Topics:  AED/CPR: - Group verbal and written instruction with the use of models to demonstrate the basic use of the AED with the basic ABC's of resuscitation.   Anatomy and Cardiac Procedures: - Group verbal and visual presentation and models provide information about basic cardiac anatomy and function. Reviews the testing methods done to diagnose heart disease and the outcomes of the test results. Describes the  treatment choices: Medical Management, Angioplasty, or Coronary Bypass Surgery for treating various heart conditions including Myocardial Infarction, Angina, Valve Disease, and Cardiac Arrhythmias.  Written material given at graduation.   Medication Safety: - Group verbal and visual instruction to review commonly prescribed medications for heart and lung disease. Reviews the medication, class of the drug, and side effects. Includes the steps to properly store meds and maintain the prescription regimen.  Written material given at graduation.   Intimacy: - Group verbal instruction through game format to discuss how heart and lung disease can affect sexual intimacy. Written material given at graduation..   Know Your Numbers and Heart Failure: - Group verbal and visual instruction to discuss disease risk factors for cardiac and pulmonary disease and treatment options.  Reviews associated critical values for Overweight/Obesity, Hypertension, Cholesterol, and Diabetes.  Discusses basics of heart failure: signs/symptoms and treatments.  Introduces Heart Failure Zone chart for action plan for heart failure.  Written material given at graduation. Flowsheet Row Cardiac Rehab from 09/11/2021 in [facility] Cardiac and Pulmonary Rehab  Education need identified 09/11/21       Infection Prevention: - Provides verbal and written material to individual with discussion of infection control including proper hand washing and proper equipment cleaning during exercise session. Flowsheet Row Cardiac Rehab from 09/11/2021 in [facility] Cardiac and Pulmonary Rehab  Date 09/11/21  Educator AS  Instruction Review Code 1- Verbalizes Understanding       Falls Prevention: - Provides verbal and written material to individual with discussion of falls prevention and safety. Flowsheet Row Cardiac Rehab from 09/11/2021 in [facility] Cardiac and Pulmonary Rehab  Date 09/11/21  Educator AS  Instruction Review Code 1- Verbalizes  Understanding       Other: -Provides group and verbal instruction on various topics (see comments)   Knowledge Questionnaire Score:  Knowledge Questionnaire Score - 09/11/21 1622       Knowledge Questionnaire Score   Pre Score 23/26             Core Components/Risk Factors/Patient Goals at Admission:  Personal Goals and Risk Factors at Admission - 09/11/21 1632       Core  Components/Risk Factors/Patient Goals on Admission    Weight Management Yes    Intervention Weight Management: Develop a combined nutrition and exercise program designed to reach desired caloric intake, while maintaining appropriate intake of nutrient and fiber, sodium and fats, and appropriate energy expenditure required for the weight goal.;Weight Management: Provide education and appropriate resources to help participant work on and attain dietary goals.    Admit Weight 133 lb (60.3 kg)    Goal Weight: Long Term 128 lb (58.1 kg)    Expected Outcomes Short Term: Continue to assess and modify interventions until short term weight is achieved;Long Term: Adherence to nutrition and physical activity/exercise program aimed toward attainment of established weight goal;Weight Maintenance: Understanding of the daily nutrition guidelines, which includes 25-35% calories from fat, 7% or less cal from saturated fats, less than 200mg  cholesterol, less than 1.5gm of sodium, & 5 or more servings of fruits and vegetables daily    Hypertension Yes    Intervention Provide education on lifestyle modifcations including regular physical activity/exercise, weight management, moderate sodium restriction and increased consumption of fresh fruit, vegetables, and low fat dairy, alcohol moderation, and smoking cessation.;Monitor prescription use compliance.    Expected Outcomes Short Term: Continued assessment and intervention until BP is < 140/76mm HG in hypertensive participants. < 130/77mm HG in hypertensive participants with diabetes,  heart failure or chronic kidney disease.;Long Term: Maintenance of blood pressure at goal levels.    Lipids Yes    Intervention Provide education and support for participant on nutrition & aerobic/resistive exercise along with prescribed medications to achieve LDL 70mg , HDL >40mg .    Expected Outcomes Short Term: Participant states understanding of desired cholesterol values and is compliant with medications prescribed. Participant is following exercise prescription and nutrition guidelines.;Long Term: Cholesterol controlled with medications as prescribed, with individualized exercise RX and with personalized nutrition plan. Value goals: LDL < 70mg , HDL > 40 mg.             Education:Diabetes - Individual verbal and written instruction to review signs/symptoms of diabetes, desired ranges of glucose level fasting, after meals and with exercise. Acknowledge that pre and post exercise glucose checks will be done for 3 sessions at entry of program.   Core Components/Risk Factors/Patient Goals Review:    Core Components/Risk Factors/Patient Goals at Discharge (Final Review):    ITP Comments:  ITP Comments     Row Name 09/01/21 1032           ITP Comments Virtual orientation call completed today. shehas an appointment on Date: 09/11/2021  for EP eval and gym Orientation.  Documentation of diagnosis can be found in [facility]  Date: 08/09/2021 and 06/23/2021 .                Comments: initial ITP

## 2021-09-13 ENCOUNTER — Other Ambulatory Visit: Payer: Self-pay

## 2021-09-13 DIAGNOSIS — I214 Non-ST elevation (NSTEMI) myocardial infarction: Secondary | ICD-10-CM | POA: Diagnosis not present

## 2021-09-13 NOTE — Progress Notes (Signed)
Daily Session Note ? ?Patient Details  ?Name: Alexis Perez ?MRN: [id number] ?Date of Birth: 1923-07-10 ?Referring Provider:   ?Flowsheet Row Cardiac Rehab from 09/11/2021 in [facility] Cardiac and Pulmonary Rehab  ?Referring Provider Alva Garnet  ? ?  ? ? ?Encounter Date: 09/13/2021 ? ?Check In: ? Session Check In - 09/13/21 1028   ? ?  ? Check-In  ? Supervising physician immediately available to respond to emergencies See telemetry face sheet for immediately available ER MD   ? Location [facility]-Cardiac & Pulmonary Rehab   ? Staff Present Birdie Sons, MPA, RN;Jessica Luan Pulling, MA, RCEP, CCRP, CCET;Joseph [place], [place]   ? Virtual Visit No   ? Medication changes reported     No   ? Fall or balance concerns reported    No   ? Warm-up and Cool-down Performed on first and last piece of equipment   ? Resistance Training Performed Yes   ? VAD Patient? No   ? PAD/SET Patient? No   ?  ? Pain Assessment  ? Currently in Pain? No/denies   ? ?  ?  ? ?  ? ? ? ? ? ?Social History  ? ?Tobacco Use  ?Smoking Status Never  ?Smokeless Tobacco Never  ? ? ?Goals Met:  ?Independence with exercise equipment ?Exercise tolerated well ?No report of concerns or symptoms today ?Strength training completed today ? ?Goals Unmet:  ?Not Applicable ? ?Comments: First full day of exercise!  Patient was oriented to gym and equipment including functions, settings, policies, and procedures.  Patient's individual exercise prescription and treatment plan were reviewed.  All starting workloads were established based on the results of the 6 minute walk test done at initial orientation visit.  The plan for exercise progression was also introduced and progression will be customized based on patient's performance and goals. ? ? ? ?Dr. Emily Filbert is Medical Director for [place].  ?Dr. Ottie Glazier is Medical Director for [facility] Pulmonary Rehabilitation. ?

## 2021-09-15 ENCOUNTER — Encounter: Payer: Medicare PPO | Admitting: *Deleted

## 2021-09-15 ENCOUNTER — Other Ambulatory Visit: Payer: Self-pay

## 2021-09-15 DIAGNOSIS — I214 Non-ST elevation (NSTEMI) myocardial infarction: Secondary | ICD-10-CM

## 2021-09-15 NOTE — Progress Notes (Signed)
Daily Session Note ? ?Patient Details  ?Name: Alexis Perez ?MRN: [id number] ?Date of Birth: 1923/12/17 ?Referring Provider:   ?Flowsheet Row Cardiac Rehab from 09/11/2021 in [facility] Cardiac and Pulmonary Rehab  ?Referring Provider Alva Garnet  ? ?  ? ? ?Encounter Date: 09/15/2021 ? ?Check In: ? Session Check In - 09/15/21 1102   ? ?  ? Check-In  ? Supervising physician immediately available to respond to emergencies See telemetry face sheet for immediately available ER MD   ? Location [facility]-Cardiac & Pulmonary Rehab   ? Staff Present Renita Papa, RN BSN;Joseph [place], RCP,RRT,BSRT;Jessica [place], [place], [place], [place], CCET   ? Virtual Visit No   ? Medication changes reported     No   ? Warm-up and Cool-down Performed on first and last piece of equipment   ? Resistance Training Performed Yes   ? VAD Patient? No   ? PAD/SET Patient? No   ?  ? Pain Assessment  ? Currently in Pain? No/denies   ? ?  ?  ? ?  ? ? ? ? ? ?Social History  ? ?Tobacco Use  ?Smoking Status Never  ?Smokeless Tobacco Never  ? ? ?Goals Met:  ?Independence with exercise equipment ?Exercise tolerated well ?No report of concerns or symptoms today ?Strength training completed today ? ?Goals Unmet:  ?Not Applicable ? ?Comments: Pt able to follow exercise prescription today without complaint.  Will continue to monitor for progression. ? ? ? ?Dr. Emily Filbert is Medical Director for [place].  ?Dr. Ottie Glazier is Medical Director for [facility] Pulmonary Rehabilitation. ?

## 2021-09-19 ENCOUNTER — Other Ambulatory Visit: Payer: Self-pay

## 2021-09-19 DIAGNOSIS — I214 Non-ST elevation (NSTEMI) myocardial infarction: Secondary | ICD-10-CM

## 2021-09-19 NOTE — Progress Notes (Signed)
Daily Session Note ? ?Patient Details  ?Name: Alexis Perez ?MRN: [id number] ?Date of Birth: [birth year] ?Referring Provider:   ?Flowsheet Row Cardiac Rehab from 09/11/2021 in [facility] Cardiac and Pulmonary Rehab  ?Referring Provider Alva Garnet  ? ?  ? ? ?Encounter Date: 09/19/2021 ? ?Check In: ? Session Check In - 09/19/21 1054   ? ?  ? Check-In  ? Supervising physician immediately available to respond to emergencies See telemetry face sheet for immediately available ER MD   ? Location [facility]-Cardiac & Pulmonary Rehab   ? Staff Present Birdie Sons, MPA, RN;Melissa [place], RDN, LDN;Jessica [place], MA, RCEP, CCRP, CCET;Amanda Sommer, BA, ACSM CEP, Exercise Physiologist   ? Virtual Visit No   ? Medication changes reported     No   ? Fall or balance concerns reported    No   ? Warm-up and Cool-down Performed on first and last piece of equipment   ? Resistance Training Performed Yes   ? VAD Patient? No   ? PAD/SET Patient? No   ?  ? Pain Assessment  ? Currently in Pain? No/denies   ? ?  ?  ? ?  ? ? ? ? ? ?Social History  ? ?Tobacco Use  ?Smoking Status Never  ?Smokeless Tobacco Never  ? ? ?Goals Met:  ?Independence with exercise equipment ?Exercise tolerated well ?No report of concerns or symptoms today ?Strength training completed today ? ?Goals Unmet:  ?Not Applicable ? ?Comments: Pt able to follow exercise prescription today without complaint.  Will continue to monitor for progression. ? ? ? ?Dr. Emily Filbert is Medical Director for [place].  ?Dr. Ottie Glazier is Medical Director for [facility] Pulmonary Rehabilitation. ?

## 2021-09-20 ENCOUNTER — Encounter: Payer: Self-pay | Admitting: *Deleted

## 2021-09-20 DIAGNOSIS — I214 Non-ST elevation (NSTEMI) myocardial infarction: Secondary | ICD-10-CM

## 2021-09-20 NOTE — Progress Notes (Signed)
Cardiac Individual Treatment Plan ? ?Patient Details  ?Name: Alexis Perez ?MRN: [id number] ?Date of Birth: 09/14/1923 ?Referring Provider:   ?Flowsheet Row [facility]  ?Referring Provider Cecile HearingMoskowitz  ? ?  ? ? ?Initial Encounter Date:  ?Flowsheet [facility]  ?Date 09/11/21  ? ?  ? ? ?Visit Diagnosis: NSTEMI (non-ST elevation myocardial infarction) (HCC) ? ?Patient's Home Medications on Admission: ? ?Current Outpatient Medications:  ?  amLODipine (NORVASC) 10 MG tablet, Take by mouth., Disp: , Rfl:  ?  aspirin EC 81 MG tablet, Take 81 mg by mouth daily. Swallow whole., Disp: , Rfl:  ?  Calcium-Vitamin D-Vitamin K 500-100-40 MG-UNT-MCG CHEW, Chew by mouth., Disp: , Rfl:  ?  Cholecalciferol 50 MCG (2000 UT) CAPS, Take by mouth., Disp: , Rfl:  ?  clopidogrel (PLAVIX) 75 MG tablet, Take by mouth., Disp: , Rfl:  ?  diclofenac Sodium (VOLTAREN) 1 % GEL, Apply topically., Disp: , Rfl:  ?  ezetimibe (ZETIA) 10 MG tablet, Take by mouth., Disp: , Rfl:  ?  isosorbide mononitrate (IMDUR) 30 MG 24 hr tablet, Take by mouth., Disp: , Rfl:  ?  losartan (COZAAR) 100 MG tablet, Take by mouth. (Patient not taking: Reported on 09/01/2021), Disp: , Rfl:  ?  meloxicam (MOBIC) 7.5 MG tablet, Take by mouth. (Patient not taking: Reported on 09/01/2021), Disp: , Rfl:  ?  metoprolol succinate (TOPROL-XL) 50 MG 24 hr tablet, Take by mouth., Disp: , Rfl:  ?  Multiple Vitamins-Minerals (PRESERVISION AREDS 2) CAPS, daily., Disp: , Rfl:  ?  nitroGLYCERIN (NITROSTAT) 0.4 MG SL tablet, Place under the tongue., Disp: , Rfl:  ?  Polyethyl Glycol-Propyl Glycol 0.4-0.3 % SOLN, Administer 1 drop to both eyes in the morning., Disp: , Rfl:  ? ?Past Medical History: ?Past Medical History:  ?Diagnosis Date  ? Hypertension   ? ? ?Tobacco Use: ?Social History  ? ?Tobacco Use  ?Smoking Status Never  ?Smokeless Tobacco Never  ? ? ?Labs: ?Review Flowsheet   ? ?     ? View : No data to display.  ?  ?  ?  ?  ?  ? ? ? ?Exercise Target Goals: ?Exercise Program Goal: ?Individual exercise prescription set using results from initial 6 min walk test and THRR while considering  patient?s activity barriers and safety.  ? ?Exercise Prescription Goal: ?Initial exercise prescription builds to 30-45 minutes a day of aerobic activity, 2-3 days per week.  Home exercise guidelines will be given to patient during program as part of exercise prescription that the participant will acknowledge. ? ? ?Education: Aerobic Exercise: ?- Group verbal and visual presentation on the components of exercise prescription. Introduces F.I.T.T principle from ACSM for exercise prescriptions.  Reviews F.I.T.T. principles of aerobic exercise including progression. Written material given at graduation. ? ? ?Education: Resistance Exercise: ?- Group verbal and visual presentation on the components of exercise prescription. Introduces F.I.T.T principle from ACSM for exercise prescriptions  Reviews F.I.T.T. principles of resistance exercise including progression. Written material given at graduation. ? ?  ?Education: Exercise & Equipment Safety: ?- Individual verbal instruction and demonstration of equipment use and safety with use of the equipment. ?Flow[facility]  ?Date 09/11/21  ?Educator AS  ?Instruction Review Code 1- Verbalizes Understanding  ? ?  ? ? ?Education: Exercise Physiology & General Exercise Guidelines: ?- Group verbal and written instruction  with models to review the exercise physiology of the cardiovascular system and associated critical values. Provides general exercise guidelines with specific guidelines to those with heart or lung disease.  ? ? ?Education: Flexibility, Balance, Mind/Body Relaxation: ?- Group verbal and visual presentation with interactive activity on the components of exercise prescription. Introduces F.I.T.T  principle from ACSM for exercise prescriptions. Reviews F.I.T.T. principles of flexibility and balance exercise training including progression. Also discusses the mind body connection.  Reviews various relaxation techniques to help reduce and manage stress (i.e. Deep breathing, progressive muscle relaxation, and visualization). Balance handout provided to take home. Written material given at graduation. ? ? ?Activity Barriers & Risk Stratification: ? Activity Barriers & Cardiac Risk Stratification - 09/01/21 1014   ? ?  ? Activity Barriers & Cardiac Risk Stratification  ? Activity Barriers Assistive Device;Arthritis;Chest Pain/Angina   ? Cardiac Risk Stratification Moderate   ? ?  ?  ? ?  ? ? ?6 Minute Walk: ? 6 Minute Walk   ? ? Row Name 09/11/21 1624  ?  ?  ?  ? 6 Minute Walk  ? Phase Initial    ? Distance 445 feet    ? Walk Time 5.75 minutes    ? # of Rest Breaks 1    ? MPH 0.9    ? METS 1.5    ? RPE 11    ? Perceived Dyspnea  0    ? VO2 Peak 0.1    ? Symptoms No    ? Resting HR 85 bpm    ? Resting BP 110/56    ? Resting Oxygen Saturation  99 %    ? Exercise Oxygen Saturation  during 6 min walk 99 %    ? Max Ex. HR 95 bpm    ? Max Ex. BP 128/64    ? 2 Minute Post BP 112/64    ? ?  ?  ? ?  ? ? ?Oxygen Initial Assessment: ? ? ?Oxygen Re-Evaluation: ? ? ?Oxygen Discharge (Final Oxygen Re-Evaluation): ? ? ?Initial Exercise Prescription: ? Initial Exercise Prescription - 09/11/21 1600   ? ?  ? Date of Initial Exercise RX and Referring Provider  ? Date 09/11/21   ? Referring Provider [name]   ?  ? Oxygen  ? Maintain Oxygen Saturation 88% or higher   ?  ? Treadmill  ? MPH 0.8   ? Grade 0   ? Minutes 15   ? METs 1.4   ?  ? REL-XR  ? Level 1   ? Speed 50   ? Minutes 15   ? METs 1.5   ?  ? T5 Nustep  ? Level 1   ? SPM 80   ? Minutes 15   ? METs 1.5   ?  ? Track  ? Laps 10   ? Minutes 15   ? METs 1.5   ?  ? Prescription Details  ? Frequency (times per week) 3   ? Duration Progress to 30 minutes of continuous aerobic  without signs/symptoms of physical distress   ?  ? Intensity  ? THRR 40-80% of Max Heartrate 101-117   ? Ratings of Perceived Exertion 11-13   ? Perceived Dyspnea 0-4   ?  ? Resistance Training  ? Training Prescription Yes   ? Weight 3 lb   ? Reps 10-15   ? ?  ?  ? ?  ? ? ?Perform Capillary Blood Glucose checks as needed. ? ?Exercise Prescription Changes: ? ?  Exercise Prescription Changes   ? ? Row Name 09/11/21 1600  ?  ?  ?  ?  ?  ? Response to Exercise  ? Blood Pressure (Admit) 110/56      ? Blood Pressure (Exercise) 128/64      ? Blood Pressure (Exit) 112/64      ? Heart Rate (Admit) 85 bpm      ? Heart Rate (Exercise) 95 bpm      ? Heart Rate (Exit) 89 bpm      ? Oxygen Saturation (Admit) 99 %      ? Oxygen Saturation (Exercise) 99 %      ? Rating of Perceived Exertion (Exercise) 11      ? Perceived Dyspnea (Exercise) 0      ? Symptoms none      ? ?  ?  ? ?  ? ? ?Exercise Comments: ? ? Exercise Comments   ? ? Row Name 09/13/21 1028  ?  ?  ?  ?  ? Exercise Comments First full day of exercise!  Patient was oriented to gym and equipment including functions, settings, policies, and procedures.  Patient's individual exercise prescription and treatment plan were reviewed.  All starting workloads were established based on the results of the 6 minute walk test done at initial orientation visit.  The plan for exercise progression was also introduced and progression will be customized based on patient's performance and goals.      ? ?  ?  ? ?  ? ? ?Exercise Goals and Review: ? ? Exercise Goals   ? ? Row Name 09/11/21 1634  ?  ?  ?  ?  ?  ? Exercise Goals  ? Increase Physical Activity Yes      ? Intervention Provide advice, education, support and counseling about physical activity/exercise needs.;Develop an individualized exercise prescription for aerobic and resistive training based on initial evaluation findings, risk stratification, comorbidities and participant's personal goals.      ? Expected Outcomes Short Term:  Attend rehab on a regular basis to increase amount of physical activity.;Long Term: Add in home exercise to make exercise part of routine and to increase amount of physical activity.;Long Term: Exercising regularl

## 2021-09-21 ENCOUNTER — Other Ambulatory Visit: Payer: Self-pay

## 2021-09-21 DIAGNOSIS — I214 Non-ST elevation (NSTEMI) myocardial infarction: Secondary | ICD-10-CM | POA: Diagnosis not present

## 2021-09-21 NOTE — Progress Notes (Signed)
Daily Session Note ? ?Patient Details  ?Name: Alexis Perez ?MRN: [id number] ?Date of Birth: [birth year] ?Referring Provider:   ?Flowsheet Row Cardiac Rehab from 09/11/2021 in [facility] Cardiac and Pulmonary Rehab  ?Referring Provider Alva Garnet  ? ?  ? ? ?Encounter Date: 09/21/2021 ? ?Check In: ? Session Check In - 09/21/21 1040   ? ?  ? Check-In  ? Supervising physician immediately available to respond to emergencies See telemetry face sheet for immediately available ER MD   ? Location [facility]-Cardiac & Pulmonary Rehab   ? Staff Present Birdie Sons, MPA, RN;Melissa [place], RDN, LDN;Meredith Sherryll Burger, RN Margurite Auerbach, MS, ASCM CEP, Exercise Physiologist   ? Virtual Visit No   ? Medication changes reported     No   ? Fall or balance concerns reported    No   ? Warm-up and Cool-down Performed on first and last piece of equipment   ? Resistance Training Performed Yes   ? VAD Patient? No   ? PAD/SET Patient? No   ?  ? Pain Assessment  ? Currently in Pain? No/denies   ? ?  ?  ? ?  ? ? ? ? ? ?Social History  ? ?Tobacco Use  ?Smoking Status Never  ?Smokeless Tobacco Never  ? ? ?Goals Met:  ?Independence with exercise equipment ?Exercise tolerated well ?No report of concerns or symptoms today ?Strength training completed today ? ?Goals Unmet:  ?Not Applicable ? ?Comments: Pt able to follow exercise prescription today without complaint.  Will continue to monitor for progression. ? ? ? ?Dr. Emily Filbert is Medical Director for [place].  ?Dr. Ottie Glazier is Medical Director for [facility] Pulmonary Rehabilitation. ?

## 2021-09-22 ENCOUNTER — Other Ambulatory Visit: Payer: Self-pay

## 2021-09-22 ENCOUNTER — Encounter: Payer: Medicare PPO | Admitting: *Deleted

## 2021-09-22 DIAGNOSIS — I214 Non-ST elevation (NSTEMI) myocardial infarction: Secondary | ICD-10-CM | POA: Diagnosis not present

## 2021-09-22 NOTE — Progress Notes (Signed)
Daily Session Note ? ?Patient Details  ?Name: Alexis Perez ?MRN: [id number] ?Date of Birth: 1924-02-13 ?Referring Provider:   ?Flowsheet Row Cardiac Rehab from 09/11/2021 in [facility] Cardiac and Pulmonary Rehab  ?Referring Provider Alva Garnet  ? ?  ? ? ?Encounter Date: 09/22/2021 ? ?Check In: ? Session Check In - 09/22/21 1103   ? ?  ? Check-In  ? Supervising physician immediately available to respond to emergencies See telemetry face sheet for immediately available ER MD   ? Location [facility]-Cardiac & Pulmonary Rehab   ? Staff Present Renita Papa, RN BSN;Joseph [place], RCP,RRT,BSRT;Jessica [place], [place], [place], [place], CCET   ? Virtual Visit No   ? Medication changes reported     No   ? Fall or balance concerns reported    No   ? Warm-up and Cool-down Performed on first and last piece of equipment   ? Resistance Training Performed Yes   ? VAD Patient? No   ? PAD/SET Patient? No   ?  ? Pain Assessment  ? Currently in Pain? No/denies   ? ?  ?  ? ?  ? ? ? ? ? ?Social History  ? ?Tobacco Use  ?Smoking Status Never  ?Smokeless Tobacco Never  ? ? ?Goals Met:  ?Independence with exercise equipment ?Exercise tolerated well ?No report of concerns or symptoms today ?Strength training completed today ? ?Goals Unmet:  ?Not Applicable ? ?Comments: Pt able to follow exercise prescription today without complaint.  Will continue to monitor for progression. ? ? ? ?Dr. Emily Filbert is Medical Director for [place].  ?Dr. Ottie Glazier is Medical Director for [facility] Pulmonary Rehabilitation. ?

## 2021-09-26 ENCOUNTER — Other Ambulatory Visit: Payer: Self-pay

## 2021-09-26 DIAGNOSIS — I214 Non-ST elevation (NSTEMI) myocardial infarction: Secondary | ICD-10-CM

## 2021-09-26 NOTE — Progress Notes (Signed)
Daily Session Note ? ?Patient Details  ?Name: Alexis Perez ?MRN: [id number] ?Date of Birth: 1923-09-04 ?Referring Provider:   ?Flowsheet Row Cardiac Rehab from 09/11/2021 in [facility] Cardiac and Pulmonary Rehab  ?Referring Provider Alva Garnet  ? ?  ? ? ?Encounter Date: 09/26/2021 ? ?Check In: ? Session Check In - 09/26/21 1110   ? ?  ? Check-In  ? Supervising physician immediately available to respond to emergencies See telemetry face sheet for immediately available ER MD   ? Location [facility]-Cardiac & Pulmonary Rehab   ? Staff Present Birdie Sons, MPA, RN;Jessica [place], MA, RCEP, CCRP, CCET;Amanda Sommer, BA, ACSM CEP, Exercise Physiologist;Melissa [place], RDN, LDN   ? Virtual Visit No   ? Medication changes reported     No   ? Fall or balance concerns reported    No   ? Warm-up and Cool-down Performed on first and last piece of equipment   ? Resistance Training Performed Yes   ? VAD Patient? No   ? PAD/SET Patient? No   ?  ? Pain Assessment  ? Currently in Pain? No/denies   ? ?  ?  ? ?  ? ? ? ? ? ?Social History  ? ?Tobacco Use  ?Smoking Status Never  ?Smokeless Tobacco Never  ? ? ?Goals Met:  ?Independence with exercise equipment ?Exercise tolerated well ?No report of concerns or symptoms today ?Strength training completed today ? ?Goals Unmet:  ?Not Applicable ? ?Comments: Pt able to follow exercise prescription today without complaint.  Will continue to monitor for progression. ? ? ? ?Dr. Emily Filbert is Medical Director for [place].  ?Dr. Ottie Glazier is Medical Director for [facility] Pulmonary Rehabilitation. ?

## 2021-09-28 DIAGNOSIS — I214 Non-ST elevation (NSTEMI) myocardial infarction: Secondary | ICD-10-CM | POA: Diagnosis not present

## 2021-09-28 NOTE — Progress Notes (Signed)
Daily Session Note ? ?Patient Details  ?Name: Alexis Perez ?MRN: [id number] ?Date of Birth: 07-23-1923 ?Referring Provider:   ?Flowsheet Row Cardiac Rehab from 09/11/2021 in [facility] Cardiac and Pulmonary Rehab  ?Referring Provider Alva Garnet  ? ?  ? ? ?Encounter Date: 09/28/2021 ? ?Check In: ? Session Check In - 09/28/21 1111   ? ?  ? Check-In  ? Supervising physician immediately available to respond to emergencies See telemetry face sheet for immediately available ER MD   ? Location [facility]-Cardiac & Pulmonary Rehab   ? Staff Present Birdie Sons, MPA, RN;Joseph [place], RCP,RRT,BSRT;Meredith Sherryll Burger, RN BSN;Melissa [place], RDN, LDN   ? Virtual Visit No   ? Medication changes reported     No   ? Fall or balance concerns reported    No   ? Warm-up and Cool-down Performed on first and last piece of equipment   ? Resistance Training Performed Yes   ? VAD Patient? No   ? PAD/SET Patient? No   ?  ? Pain Assessment  ? Currently in Pain? No/denies   ? ?  ?  ? ?  ? ? ? ? ? ?Social History  ? ?Tobacco Use  ?Smoking Status Never  ?Smokeless Tobacco Never  ? ? ?Goals Met:  ?Independence with exercise equipment ?Exercise tolerated well ?No report of concerns or symptoms today ?Strength training completed today ? ?Goals Unmet:  ?Not Applicable ? ?Comments: Pt able to follow exercise prescription today without complaint.  Will continue to monitor for progression. ? ? ? ?Dr. Emily Filbert is Medical Director for [place].  ?Dr. Ottie Glazier is Medical Director for [facility] Pulmonary Rehabilitation. ?

## 2021-09-29 ENCOUNTER — Encounter: Payer: Medicare PPO | Admitting: *Deleted

## 2021-09-29 DIAGNOSIS — I214 Non-ST elevation (NSTEMI) myocardial infarction: Secondary | ICD-10-CM

## 2021-09-29 NOTE — Progress Notes (Signed)
Daily Session Note ? ?Patient Details  ?Name: Alexis Perez ?MRN: [id number] ?Date of Birth: 06/18/1924 ?Referring Provider:   ?Flowsheet Row Cardiac Rehab from 09/11/2021 in [facility] Cardiac and Pulmonary Rehab  ?Referring Provider Alva Garnet  ? ?  ? ? ?Encounter Date: 09/29/2021 ? ?Check In: ? Session Check In - 09/29/21 1108   ? ?  ? Check-In  ? Supervising physician immediately available to respond to emergencies See telemetry face sheet for immediately available ER MD   ? Location [facility]-Cardiac & Pulmonary Rehab   ? Staff Present Renita Papa, RN BSN;Joseph [place], RCP,RRT,BSRT;Melissa [place], [place], LDN   ? Virtual Visit No   ? Medication changes reported     No   ? Fall or balance concerns reported    No   ? Warm-up and Cool-down Performed on first and last piece of equipment   ? Resistance Training Performed Yes   ? VAD Patient? No   ? PAD/SET Patient? No   ?  ? Pain Assessment  ? Currently in Pain? No/denies   ? ?  ?  ? ?  ? ? ? ? ? ?Social History  ? ?Tobacco Use  ?Smoking Status Never  ?Smokeless Tobacco Never  ? ? ?Goals Met:  ?Independence with exercise equipment ?Exercise tolerated well ?No report of concerns or symptoms today ?Strength training completed today ? ?Goals Unmet:  ?Not Applicable ? ?Comments: Pt able to follow exercise prescription today without complaint.  Will continue to monitor for progression. ? ? ? ?Dr. Emily Filbert is Medical Director for [place].  ?Dr. Ottie Glazier is Medical Director for [facility] Pulmonary Rehabilitation. ?

## 2021-10-03 ENCOUNTER — Telehealth: Payer: Self-pay

## 2021-10-03 ENCOUNTER — Encounter: Payer: Self-pay | Admitting: Emergency Medicine

## 2021-10-03 ENCOUNTER — Emergency Department: Payer: Medicare PPO

## 2021-10-03 ENCOUNTER — Inpatient Hospital Stay
Admission: EM | Admit: 2021-10-03 | Discharge: 2021-10-05 | DRG: 282 | Disposition: A | Discharge status: Other Acute Inpatient Hospital | Payer: Medicare PPO | Attending: Internal Medicine | Admitting: Internal Medicine

## 2021-10-03 ENCOUNTER — Other Ambulatory Visit: Payer: Self-pay

## 2021-10-03 DIAGNOSIS — I214 Non-ST elevation (NSTEMI) myocardial infarction: Principal | ICD-10-CM | POA: Diagnosis present

## 2021-10-03 DIAGNOSIS — I440 Atrioventricular block, first degree: Secondary | ICD-10-CM | POA: Diagnosis present

## 2021-10-03 DIAGNOSIS — Z880 Allergy status to penicillin: Secondary | ICD-10-CM | POA: Diagnosis not present

## 2021-10-03 DIAGNOSIS — I25118 Atherosclerotic heart disease of native coronary artery with other forms of angina pectoris: Secondary | ICD-10-CM | POA: Diagnosis present

## 2021-10-03 DIAGNOSIS — Z8249 Family history of ischemic heart disease and other diseases of the circulatory system: Secondary | ICD-10-CM

## 2021-10-03 DIAGNOSIS — M4185 Other forms of scoliosis, thoracolumbar region: Secondary | ICD-10-CM | POA: Diagnosis present

## 2021-10-03 DIAGNOSIS — I2510 Atherosclerotic heart disease of native coronary artery without angina pectoris: Secondary | ICD-10-CM

## 2021-10-03 DIAGNOSIS — Z79899 Other long term (current) drug therapy: Secondary | ICD-10-CM | POA: Diagnosis not present

## 2021-10-03 DIAGNOSIS — I10 Essential (primary) hypertension: Secondary | ICD-10-CM | POA: Diagnosis present

## 2021-10-03 DIAGNOSIS — Z823 Family history of stroke: Secondary | ICD-10-CM

## 2021-10-03 DIAGNOSIS — Z791 Long term (current) use of non-steroidal anti-inflammatories (NSAID): Secondary | ICD-10-CM

## 2021-10-03 DIAGNOSIS — R8281 Pyuria: Secondary | ICD-10-CM

## 2021-10-03 DIAGNOSIS — I259 Chronic ischemic heart disease, unspecified: Secondary | ICD-10-CM | POA: Diagnosis not present

## 2021-10-03 DIAGNOSIS — Z955 Presence of coronary angioplasty implant and graft: Secondary | ICD-10-CM

## 2021-10-03 DIAGNOSIS — N3000 Acute cystitis without hematuria: Secondary | ICD-10-CM | POA: Diagnosis not present

## 2021-10-03 DIAGNOSIS — E785 Hyperlipidemia, unspecified: Secondary | ICD-10-CM | POA: Diagnosis present

## 2021-10-03 DIAGNOSIS — I25110 Atherosclerotic heart disease of native coronary artery with unstable angina pectoris: Secondary | ICD-10-CM | POA: Diagnosis not present

## 2021-10-03 DIAGNOSIS — I252 Old myocardial infarction: Secondary | ICD-10-CM

## 2021-10-03 DIAGNOSIS — E876 Hypokalemia: Secondary | ICD-10-CM | POA: Diagnosis present

## 2021-10-03 DIAGNOSIS — Z833 Family history of diabetes mellitus: Secondary | ICD-10-CM | POA: Diagnosis not present

## 2021-10-03 DIAGNOSIS — Z881 Allergy status to other antibiotic agents status: Secondary | ICD-10-CM

## 2021-10-03 DIAGNOSIS — Z7902 Long term (current) use of antithrombotics/antiplatelets: Secondary | ICD-10-CM | POA: Diagnosis not present

## 2021-10-03 DIAGNOSIS — Z882 Allergy status to sulfonamides status: Secondary | ICD-10-CM | POA: Diagnosis not present

## 2021-10-03 DIAGNOSIS — Z66 Do not resuscitate: Secondary | ICD-10-CM | POA: Diagnosis present

## 2021-10-03 DIAGNOSIS — Z888 Allergy status to other drugs, medicaments and biological substances status: Secondary | ICD-10-CM | POA: Diagnosis not present

## 2021-10-03 DIAGNOSIS — Z7982 Long term (current) use of aspirin: Secondary | ICD-10-CM | POA: Diagnosis not present

## 2021-10-03 HISTORY — DX: Pyuria: R82.81

## 2021-10-03 LAB — CBC WITH DIFFERENTIAL/PLATELET
Abs Immature Granulocytes: 0.05 10*3/uL (ref 0.00–0.07)
Basophils Absolute: 0 10*3/uL (ref 0.0–0.1)
Basophils Relative: 0 %
Eosinophils Absolute: 0.2 10*3/uL (ref 0.0–0.5)
Eosinophils Relative: 1 %
HCT: 41.1 % (ref 36.0–46.0)
Hemoglobin: 13.3 g/dL (ref 12.0–15.0)
Immature Granulocytes: 1 %
Lymphocytes Relative: 5 %
Lymphs Abs: 0.5 10*3/uL — ABNORMAL LOW (ref 0.7–4.0)
MCH: 30.6 pg (ref 26.0–34.0)
MCHC: 32.4 g/dL (ref 30.0–36.0)
MCV: 94.5 fL (ref 80.0–100.0)
Monocytes Absolute: 0.5 10*3/uL (ref 0.1–1.0)
Monocytes Relative: 5 %
Neutro Abs: 9.6 10*3/uL — ABNORMAL HIGH (ref 1.7–7.7)
Neutrophils Relative %: 88 %
Platelets: 226 10*3/uL (ref 150–400)
RBC: 4.35 MIL/uL (ref 3.87–5.11)
RDW: 13.5 % (ref 11.5–15.5)
WBC: 10.9 10*3/uL — ABNORMAL HIGH (ref 4.0–10.5)
nRBC: 0 % (ref 0.0–0.2)

## 2021-10-03 LAB — COMPREHENSIVE METABOLIC PANEL
ALT: 20 U/L (ref 0–44)
AST: 25 U/L (ref 15–41)
Albumin: 4.4 g/dL (ref 3.5–5.0)
Alkaline Phosphatase: 83 U/L (ref 38–126)
Anion gap: 12 (ref 5–15)
BUN: 22 mg/dL (ref 8–23)
CO2: 25 mmol/L (ref 22–32)
Calcium: 9.5 mg/dL (ref 8.9–10.3)
Chloride: 97 mmol/L — ABNORMAL LOW (ref 98–111)
Creatinine, Ser: 0.67 mg/dL (ref 0.44–1.00)
GFR, Estimated: >60 mL/min (ref >60)
Glucose, Bld: 157 mg/dL — ABNORMAL HIGH (ref 70–99)
Potassium: 4 mmol/L (ref 3.5–5.1)
Sodium: 134 mmol/L — ABNORMAL LOW (ref 135–145)
Total Bilirubin: 0.9 mg/dL (ref 0.3–1.2)
Total Protein: 8.2 g/dL — ABNORMAL HIGH (ref 6.5–8.1)

## 2021-10-03 LAB — TROPONIN I (HIGH SENSITIVITY)
Troponin I (High Sensitivity): 24 ng/L — ABNORMAL HIGH (ref <18)
Troponin I (High Sensitivity): 1380 ng/L (ref <18)
Troponin I (High Sensitivity): 5681 ng/L (ref <18)
Troponin I (High Sensitivity): 5560 ng/L (ref <18)

## 2021-10-03 LAB — URINALYSIS, ROUTINE W REFLEX MICROSCOPIC
Bacteria, UA: NONE SEEN
Bilirubin Urine: NEGATIVE
Glucose, UA: NEGATIVE mg/dL
Hgb urine dipstick: NEGATIVE
Ketones, ur: 20 mg/dL — AB
Nitrite: NEGATIVE
Protein, ur: 30 mg/dL — AB
Specific Gravity, Urine: 1.014 (ref 1.005–1.030)
pH: 5 (ref 5.0–8.0)

## 2021-10-03 LAB — LACTIC ACID, PLASMA: Lactic Acid, Venous: 1.1 mmol/L (ref 0.5–1.9)

## 2021-10-03 LAB — HEPARIN LEVEL (UNFRACTIONATED)
Heparin Unfractionated: 0.15 IU/mL — ABNORMAL LOW (ref 0.30–0.70)
Heparin Unfractionated: 0.39 IU/mL (ref 0.30–0.70)

## 2021-10-03 LAB — LIPASE, BLOOD: Lipase: 27 U/L (ref 11–51)

## 2021-10-03 LAB — PROTIME-INR
INR: 1 (ref 0.8–1.2)
Prothrombin Time: 13.5 seconds (ref 11.4–15.2)

## 2021-10-03 LAB — APTT: aPTT: 31 seconds (ref 24–36)

## 2021-10-03 MED ORDER — MAGNESIUM HYDROXIDE 400 MG/5ML PO SUSP
30.0000 mL | Freq: Every day | ORAL | Status: DC | PRN
Start: 2021-10-03 — End: 2021-10-05

## 2021-10-03 MED ORDER — SODIUM CHLORIDE 0.9 % IV SOLN: INTRAVENOUS | Status: DC

## 2021-10-03 MED ORDER — HEPARIN BOLUS VIA INFUSION
3500.0000 [IU] | Freq: Once | INTRAVENOUS | Status: AC
  Administered 2021-10-03: 3500 [IU] via INTRAVENOUS
  Filled 2021-10-03: qty 3500

## 2021-10-03 MED ORDER — NITROGLYCERIN 0.4 MG SL SUBL
0.4000 mg | SUBLINGUAL_TABLET | SUBLINGUAL | Status: DC | PRN
  Administered 2021-10-04 (×3): 0.4 mg via SUBLINGUAL
  Filled 2021-10-03 (×2): qty 1

## 2021-10-03 MED ORDER — CLOPIDOGREL BISULFATE 75 MG PO TABS
75.0000 mg | ORAL_TABLET | Freq: Every day | ORAL | Status: DC
  Administered 2021-10-03 – 2021-10-05 (×3): 75 mg via ORAL
  Filled 2021-10-03 (×3): qty 1

## 2021-10-03 MED ORDER — ALPRAZOLAM 0.25 MG PO TABS
0.2500 mg | ORAL_TABLET | Freq: Two times a day (BID) | ORAL | Status: DC | PRN
Start: 2021-10-03 — End: 2021-10-05

## 2021-10-03 MED ORDER — SODIUM CHLORIDE 0.9 % IV SOLN
1.0000 g | INTRAVENOUS | Status: DC
  Administered 2021-10-04 – 2021-10-05 (×2): 1 g via INTRAVENOUS
  Filled 2021-10-03 (×2): qty 10

## 2021-10-03 MED ORDER — ASPIRIN 81 MG PO CHEW
324.0000 mg | CHEWABLE_TABLET | Freq: Once | ORAL | Status: AC
  Administered 2021-10-03: 324 mg via ORAL
  Filled 2021-10-03: qty 4

## 2021-10-03 MED ORDER — POLYVINYL ALCOHOL 1.4 % OP SOLN
1.0000 [drp] | Freq: Every day | OPHTHALMIC | Status: DC
  Filled 2021-10-03: qty 15

## 2021-10-03 MED ORDER — METOPROLOL SUCCINATE ER 50 MG PO TB24
50.0000 mg | ORAL_TABLET | Freq: Every day | ORAL | Status: DC
  Administered 2021-10-03 – 2021-10-04 (×2): 50 mg via ORAL
  Filled 2021-10-03 (×2): qty 1

## 2021-10-03 MED ORDER — OCUVITE-LUTEIN PO CAPS
ORAL_CAPSULE | Freq: Every day | ORAL | Status: DC
  Administered 2021-10-04 – 2021-10-05 (×2): 1 via ORAL
  Filled 2021-10-03 (×3): qty 2

## 2021-10-03 MED ORDER — NITROGLYCERIN 0.4 MG SL SUBL: 0.4000 mg | SUBLINGUAL_TABLET | SUBLINGUAL | Status: DC | PRN

## 2021-10-03 MED ORDER — HEPARIN BOLUS VIA INFUSION
1700.0000 [IU] | Freq: Once | INTRAVENOUS | Status: AC
  Administered 2021-10-03: 1700 [IU] via INTRAVENOUS
  Filled 2021-10-03: qty 1700

## 2021-10-03 MED ORDER — NITROGLYCERIN 2 % TD OINT
0.5000 [in_us] | TOPICAL_OINTMENT | Freq: Once | TRANSDERMAL | Status: AC
  Administered 2021-10-03: 0.5 [in_us] via TOPICAL
  Filled 2021-10-03: qty 1

## 2021-10-03 MED ORDER — ISOSORBIDE MONONITRATE ER 30 MG PO TB24
30.0000 mg | ORAL_TABLET | Freq: Every day | ORAL | Status: DC
  Administered 2021-10-03 – 2021-10-05 (×3): 30 mg via ORAL
  Filled 2021-10-03 (×3): qty 1

## 2021-10-03 MED ORDER — ASPIRIN EC 81 MG PO TBEC
81.0000 mg | DELAYED_RELEASE_TABLET | Freq: Every day | ORAL | Status: DC
  Administered 2021-10-04 – 2021-10-05 (×2): 81 mg via ORAL
  Filled 2021-10-03 (×2): qty 1

## 2021-10-03 MED ORDER — ONDANSETRON HCL 4 MG/2ML IJ SOLN: 4.0000 mg | Freq: Four times a day (QID) | INTRAMUSCULAR | Status: DC | PRN

## 2021-10-03 MED ORDER — VITAMIN D 25 MCG (1000 UNIT) PO TABS
2000.0000 [IU] | ORAL_TABLET | Freq: Every day | ORAL | Status: DC
  Administered 2021-10-03 – 2021-10-04 (×2): 2000 [IU] via ORAL
  Filled 2021-10-03 (×2): qty 2

## 2021-10-03 MED ORDER — MORPHINE SULFATE (PF) 2 MG/ML IV SOLN: 2.0000 mg | INTRAVENOUS | Status: DC | PRN

## 2021-10-03 MED ORDER — HEPARIN (PORCINE) 25000 UT/250ML-% IV SOLN
950.0000 [IU]/h | INTRAVENOUS | Status: DC
  Administered 2021-10-03: 750 [IU]/h via INTRAVENOUS
  Administered 2021-10-04 – 2021-10-05 (×2): 950 [IU]/h via INTRAVENOUS
  Filled 2021-10-03 (×3): qty 250

## 2021-10-03 MED ORDER — HEPARIN SODIUM (PORCINE) 5000 UNIT/ML IJ SOLN
60.0000 [IU]/kg | Freq: Once | INTRAMUSCULAR | Status: DC
Start: 2021-10-03 — End: 2021-10-03

## 2021-10-03 MED ORDER — SODIUM CHLORIDE 0.9 % IV SOLN
1.0000 g | Freq: Once | INTRAVENOUS | Status: AC
  Administered 2021-10-03: 1 g via INTRAVENOUS
  Filled 2021-10-03: qty 10

## 2021-10-03 MED ORDER — ASPIRIN 81 MG PO CHEW: 324.0000 mg | CHEWABLE_TABLET | ORAL | Status: DC

## 2021-10-03 MED ORDER — ACETAMINOPHEN 325 MG PO TABS: 650.0000 mg | ORAL_TABLET | ORAL | Status: DC | PRN

## 2021-10-03 MED ORDER — ATORVASTATIN CALCIUM 80 MG PO TABS
80.0000 mg | ORAL_TABLET | Freq: Every day | ORAL | Status: DC
  Administered 2021-10-03: 80 mg via ORAL
  Filled 2021-10-03: qty 1
  Filled 2021-10-03: qty 4

## 2021-10-03 MED ORDER — AMLODIPINE BESYLATE 10 MG PO TABS
10.0000 mg | ORAL_TABLET | Freq: Every day | ORAL | Status: DC
  Administered 2021-10-03 – 2021-10-05 (×3): 10 mg via ORAL
  Filled 2021-10-03: qty 1
  Filled 2021-10-03: qty 2
  Filled 2021-10-03: qty 1

## 2021-10-03 MED ORDER — TRAZODONE HCL 50 MG PO TABS
25.0000 mg | ORAL_TABLET | Freq: Every evening | ORAL | Status: DC | PRN
Start: 2021-10-03 — End: 2021-10-05

## 2021-10-03 MED ORDER — ASPIRIN EC 81 MG PO TBEC: 81.0000 mg | DELAYED_RELEASE_TABLET | Freq: Every day | ORAL | Status: DC

## 2021-10-03 MED ORDER — ASPIRIN 300 MG RE SUPP: 300.0000 mg | RECTAL | Status: DC

## 2021-10-03 MED ORDER — HEPARIN (PORCINE) 25000 UT/250ML-% IV SOLN: 14.0000 [IU]/kg/h | INTRAVENOUS | Status: DC

## 2021-10-03 NOTE — Progress Notes (Signed)
ANTICOAGULATION CONSULT NOTE ? ?Pharmacy Consult for heparin infusion ?Indication: ACS/STEMI ? ?Allergies  ?Allergen Reactions  ? Fentanyl Nausea And Vomiting  ? Sodium Pentobarbital [Pentobarbital] Anaphylaxis  ?  When pt was sedated for surgery  ? Hydrochlorothiazide Other (See Comments)  ?  hyponatremia  ? Ciprofloxacin   ?  Blurry vision, sleepy and make pt sick  ? Codeine Other (See Comments)  ?  Other reaction(s): UNKNOWN  ? Penicillin G Rash  ? Sulfa Antibiotics Rash  ? ? ?Patient Measurements: ?Height: 5' (152.4 cm) ?Weight: 59.9 kg (132 lb) ?IBW/kg (Calculated) : 45.5 ?Heparin Dosing Weight: 57.8 kg ? ?Vital Signs: ?Temp: 97.8 ?F (36.6 ?C) (04/04 0025) ?Temp Source: Oral (04/04 0025) ?BP: 142/68 (04/04 0330) ?Pulse Rate: 77 (04/04 0330) ? ?Labs: ?Recent Labs  ?  10/03/21 ?0028 10/03/21 ?0256  ?HGB 13.3  --   ?HCT 41.1  --   ?PLT 226  --   ?CREATININE 0.67  --   ?TROPONINIHS 24* 1,380*  ? ? ?Estimated Creatinine Clearance: 34.1 mL/min (by C-G formula based on SCr of 0.67 mg/dL). ? ? ?Medical History: ?Past Medical History:  ?Diagnosis Date  ? Hypertension   ? ? ?Assessment: ?Pt is 88 yo female presenting to ED w/ L-sided CP, found with elevated troponin I, trending up. ? ?Goal of Therapy:  ?Heparin level 0.3-0.7 units/ml ?Monitor platelets by anticoagulation protocol: Yes ?  ?Plan:  ?Bolus 3500 units x 1 ?Start heparin infusion at 750 units/hr ?Check HL in 8 hrs after start of infusion ?CBC daily while on heparin ? ?Otelia Sergeant, PharmD, MBA ?10/03/2021 ?4:05 AM ? ? ? ?

## 2021-10-03 NOTE — Assessment & Plan Note (Addendum)
Urine culture had insignificant growth, UTI ruled out.  Antibiotics discontinued. ?

## 2021-10-03 NOTE — Consult Note (Signed)
? ?[facility] Cardiology Consultation Note  ?Patient ID: Alexis Perez, MRN: [id number], DOB/AGE: 06-Dec-1923 88 y.o. ?Admit date: 10/03/2021   Date of Consult: 10/03/2021 ?Primary Physician: Glean Salvo, MD ?Primary Cardiologist: [facility] cardiology ? ?Chief Complaint:  ?Chief Complaint  ?Patient presents with  ? Chest Pain  ? ?Reason for Consult:  Chest pain ? ?HPI: 88 y.o. female with known coronary artery disease hypertension hyperlipidemia on appropriate previous myocardial medications who has had a recent cardiac catheterization due to significant progression of anginal symptoms.  During this cardiac catheterization the patient had a 100% occlusion of her right coronary artery with good collaterals from the left and septal.  There was diffuse coronary atherosclerosis of her left main into her left anterior descending artery likely ischemic in nature.  She has been placed on appropriate medications for any anginal symptoms but had some significant symptoms of chest discomfort nausea weakness and shortness of breath throughout last night for over an hour or 2.  She was seen in the emergency room at that time with an EKG showing normal sinus rhythm with first-degree AV block and anterolateral ST depressions consistent myocardial ischemia.  The patient since has had an elevation of troponin 1380/5681/5560 consistent with a non-ST elevation myocardial infarction.  Patient has been placed on heparin and nitrates and has had resolution of her chest pain at this time.  We have had long discussions of the possibility of other treatment including medication management as well as further concerns of intervention if necessary.  Due to her advanced age the patient will continue medication management at this time ? ?Past Medical History:  ?Diagnosis Date  ? Hypertension   ?   ? ?Surgical History:  ?Past Surgical History:  ?Procedure Laterality Date  ? ANKLE FRACTURE SURGERY Left 1999  ?  ? ?Home Meds: ?Prior to Admission  medications   ?Medication Sig Start Date End Date Taking? Authorizing Provider  ?amLODipine (NORVASC) 10 MG tablet Take by mouth. 06/25/21  Yes [provider]  ?aspirin EC 81 MG tablet Take 81 mg by mouth daily. Swallow whole.   Yes [provider]  ?Calcium-Vitamin D-Vitamin K [id number] MG-UNT-MCG CHEW Chew by mouth.   Yes [provider]  ?Cholecalciferol 50 MCG (2000 UT) CAPS Take by mouth.   Yes [provider]  ?clopidogrel (PLAVIX) 75 MG tablet Take by mouth. 07/22/21  Yes [provider]  ?ezetimibe (ZETIA) 10 MG tablet Take by mouth. 07/22/21  Yes [provider]  ?isosorbide mononitrate (IMDUR) 30 MG 24 hr tablet Take by mouth. 08/20/21  Yes [provider]  ?metoprolol succinate (TOPROL-XL) 50 MG 24 hr tablet Take by mouth. 08/09/21  Yes [provider]  ?Multiple Vitamins-Minerals (PRESERVISION AREDS 2) CAPS daily. 03/19/19  Yes [provider]  ?nitrofurantoin, macrocrystal-monohydrate, (MACROBID) 100 MG capsule Take 100 mg by mouth 2 (two) times daily. 10/01/21  Yes [provider]  ?nitroGLYCERIN (NITROSTAT) 0.4 MG SL tablet Place under the tongue. 07/07/21  Yes [provider]  ?Polyethyl Glycol-Propyl Glycol 0.4-0.3 % SOLN Administer 1 drop to both eyes in the morning.   Yes [provider]  ?diclofenac Sodium (VOLTAREN) 1 % GEL Apply topically. 04/17/21 04/17/22  [provider]  ?meloxicam (MOBIC) 7.5 MG tablet Take by mouth. ?Patient not taking: Reported on 09/01/2021    [provider]  ?olmesartan (BENICAR) 40 MG tablet Take 1 tablet by mouth daily. ?Patient not taking: Reported on 10/03/2021 09/21/21   [provider]  ? ? ?Inpatient  Medications:  ? amLODipine  10 mg Oral Daily  ? [START ON 10/04/2021] aspirin EC  81 mg Oral Daily  ? atorvastatin  80 mg Oral Daily  ? cholecalciferol  2,000 Units Oral Daily  ? clopidogrel  75 mg Oral Daily  ? heparin  1,700 Units Intravenous  Once  ? isosorbide mononitrate  30 mg Oral Daily  ? metoprolol succinate  50 mg Oral Daily  ? multivitamin-lutein   Oral Daily  ? polyvinyl alcohol  1 drop Both Eyes Daily  ? ? heparin 750 Units/hr (10/03/21 0505)  ? ? ?Allergies:  ?Allergies  ?Allergen Reactions  ? Fentanyl Nausea And Vomiting  ? Sodium Pentobarbital [Pentobarbital] Anaphylaxis  ?  When pt was sedated for surgery  ? Hydrochlorothiazide Other (See Comments)  ?  hyponatremia  ? Ciprofloxacin   ?  Blurry vision, sleepy and make pt sick  ? Codeine Other (See Comments)  ?  Other reaction(s): UNKNOWN  ? Penicillin G Rash  ? Sulfa Antibiotics Rash  ? ? ?Social History  ? ?Socioeconomic History  ? Marital status: Widowed  ?  Spouse name: Not on file  ? Number of children: Not on file  ? Years of education: Not on file  ? Highest education level: Not on file  ?Occupational History  ? Not on file  ?Tobacco Use  ? Smoking status: Never  ? Smokeless tobacco: Never  ?Vaping Use  ? Vaping Use: Never used  ?Substance and Sexual Activity  ? Alcohol use: Never  ? Drug use: Never  ? Sexual activity: Not on file  ?Other Topics Concern  ? Not on file  ?Social History Narrative  ? Not on file  ? ?Social Determinants of Health  ? ?Financial Resource Strain: Not on file  ?Food Insecurity: Not on file  ?Transportation Needs: Not on file  ?Physical Activity: Not on file  ?Stress: Not on file  ?Social Connections: Not on file  ?Intimate Partner Violence: Not on file  ?  ? ?No family history on file.  ? ?Review of Systems ?Positive for chest pain shortness of breath ?Negative for: ?General:  chills, fever, night sweats or weight changes.  ?Cardiovascular: PND orthopnea syncope dizziness  ?Dermatological skin lesions rashes ?Respiratory: Cough congestion ?Urologic: Frequent urination urination at night and hematuria ?Abdominal: negative for nausea, vomiting, diarrhea, bright red blood per rectum, melena, or hematemesis ?Neurologic: negative for visual changes, and/or hearing  changes  ?All other systems reviewed and are otherwise negative except as noted above. ? ?Labs: ?No results for input(s): CKTOTAL, CKMB, TROPONINI in the last 72 hours. ?Lab Results  ?Component Value Date  ? WBC 10.9 (H) 10/03/2021  ? HGB 13.3 10/03/2021  ? HCT 41.1 10/03/2021  ? MCV 94.5 10/03/2021  ? PLT 226 10/03/2021  ?  ?Recent Labs  ?Lab 10/03/21 ?0028  ?NA 134*  ?K 4.0  ?CL 97*  ?CO2 25  ?BUN 22  ?CREATININE 0.67  ?CALCIUM 9.5  ?PROT 8.2*  ?BILITOT 0.9  ?ALKPHOS 83  ?ALT 20  ?AST 25  ?GLUCOSE 157*  ? ?No results found for: CHOL, HDL, LDLCALC, TRIG ?No results found for: DDIMER ? ?Radiology/Studies:  ?DG Chest 1 View ? ?Result Date: 10/03/2021 ?CLINICAL DATA:  Chest pain EXAM: CHEST  1 VIEW COMPARISON:  None. FINDINGS: Heart size and pulmonary vascularity are normal. Probable emphysematous changes in the upper lungs. Diffuse interstitial pattern to the lungs, mostly in the periphery. This likely indicates pulmonary fibrosis. No airspace disease or consolidation. No pleural effusions.  No pneumothorax. Calcification of the aorta. Degenerative changes in the spine. Thoracolumbar scoliosis convex towards the left. IMPRESSION: Diffuse interstitial pattern to the lungs likely representing chronic interstitial lung disease. Electronically Signed   By: Lucienne Capers M.D.   On: 10/03/2021 00:59   ? ?EKG: Normal sinus rhythm with first-degree AV block with anterolateral ST depression ? ?Weights: ?Danley Danker Weights  ? 10/03/21 0027  ?Weight: 59.9 kg  ? ? ? ?Physical Exam: ?Blood pressure (!) 170/72, pulse 73, temperature 97.8 ?F (36.6 ?C), temperature source Oral, resp. rate 20, height 5' (1.524 m), weight 59.9 kg, SpO2 98 %. Body mass index is 25.78 kg/m?. ?General: Well developed, well nourished, in no acute distress. ?Head eyes ears nose throat: Normocephalic, atraumatic, sclera non-icteric, no xanthomas, nares are without discharge. No apparent thyromegaly and/or mass  ?Lungs: Normal respiratory effort.  no wheezes, no  rales, no rhonchi.  ?Heart: RRR with normal S1 S2. no murmur gallop, no rub, PMI is normal size and placement, carotid upstroke normal without bruit, jugular venous pressure is normal ?Abdomen: Soft, non-tender

## 2021-10-03 NOTE — Progress Notes (Signed)
ANTICOAGULATION CONSULT NOTE ? ?Pharmacy Consult for heparin infusion ?Indication: ACS/STEMI ? ?Allergies  ?Allergen Reactions  ? Fentanyl Nausea And Vomiting  ? Sodium Pentobarbital [Pentobarbital] Anaphylaxis  ?  When pt was sedated for surgery  ? Hydrochlorothiazide Other (See Comments)  ?  hyponatremia  ? Ciprofloxacin   ?  Blurry vision, sleepy and make pt sick  ? Codeine Other (See Comments)  ?  Other reaction(s): UNKNOWN  ? Penicillin G Rash  ? Sulfa Antibiotics Rash  ? ? ?Patient Measurements: ?Height: 5' (152.4 cm) ?Weight: 59.9 kg (132 lb) ?IBW/kg (Calculated) : 45.5 ?Heparin Dosing Weight: 57.8 kg ? ?Vital Signs: ?Temp: 97.9 ?F (36.6 ?C) (04/04 2008) ?Temp Source: Oral (04/04 1829) ?BP: 134/59 (04/04 2008) ?Pulse Rate: 79 (04/04 2008) ? ?Labs: ?Recent Labs  ?  10/03/21 ?0028 10/03/21 ?0256 10/03/21 ?9381 10/03/21 ?0175 10/03/21 ?1200 10/03/21 ?1203 10/03/21 ?2154  ?HGB 13.3  --   --   --   --   --   --   ?HCT 41.1  --   --   --   --   --   --   ?PLT 226  --   --   --   --   --   --   ?APTT  --   --  31  --   --   --   --   ?LABPROT  --   --  13.5  --   --   --   --   ?INR  --   --  1.0  --   --   --   --   ?HEPARINUNFRC  --   --   --   --  0.15*  --  0.39  ?CREATININE 0.67  --   --   --   --   --   --   ?TROPONINIHS 24* 1,380*  --  5,681*  --  5,560*  --   ? ?Heparin Dosing Weight: 57.8 kg ? ?Estimated Creatinine Clearance: 34.1 mL/min (by C-G formula based on SCr of 0.67 mg/dL). ? ? ?Medical History: ?Past Medical History:  ?Diagnosis Date  ? Hypertension   ? ? ?Assessment: ?Pt is 88 yo female w/ h/o HTN, HLD, CAD presenting to ED w/ L-sided CP, found with elevated troponin I, trending up. Pharmacy consulted for mgmt of heparin gtt ISO NSTEMI. ? ?Date Time  HL Rate/Comment ?4/04 1200 0.15 Subtherapeutic; 750>950 un/hr ?4/04 2154 0.39 Therapeutic x1; 950 un/hr  ? ?Baseline Labs: ?aPTT - 31s ?INR - 1.0 ?Hgb - 13.3 ?Plts - 226 ?Trop - 10>2585 ? ?Goal of Therapy:  ?Heparin level 0.3-0.7 units/ml ?Monitor  platelets by anticoagulation protocol: Yes ?  ?Plan: Planning heparin gtt x48hrs per cards (started 4/04 0400>>___). Continue ASA/Plavix. ?Heparin level is therapeutic x1. Continue heparin infusion at 950 units/hr.  ?Recheck heparin level in 8hrs to confirm rate; then CTM heparin level daily once consecutively therapeutic ?CBC daily while on heparin ? ?Martyn Malay, PharmD ?Clinical Pharmacist  ?10/03/2021 10:32 PM  ? ? ? ? ?

## 2021-10-03 NOTE — Progress Notes (Deleted)
ANTICOAGULATION CONSULT NOTE ? ?Pharmacy Consult for heparin infusion ?Indication: ACS/STEMI ? ?Allergies  ?Allergen Reactions  ? Fentanyl Nausea And Vomiting  ? Sodium Pentobarbital [Pentobarbital] Anaphylaxis  ?  When pt was sedated for surgery  ? Hydrochlorothiazide Other (See Comments)  ?  hyponatremia  ? Ciprofloxacin   ?  Blurry vision, sleepy and make pt sick  ? Codeine Other (See Comments)  ?  Other reaction(s): UNKNOWN  ? Penicillin G Rash  ? Sulfa Antibiotics Rash  ? ? ?Patient Measurements: ?Height: 5' (152.4 cm) ?Weight: 59.9 kg (132 lb) ?IBW/kg (Calculated) : 45.5 ?Heparin Dosing Weight: 57.8 kg ? ?Vital Signs: ?Temp: 97.9 ?F (36.6 ?C) (04/04 2008) ?Temp Source: Oral (04/04 1829) ?BP: 134/59 (04/04 2008) ?Pulse Rate: 79 (04/04 2008) ? ?Labs: ?Recent Labs  ?  10/03/21 ?0028 10/03/21 ?0256 10/03/21 ?3546 10/03/21 ?5681 10/03/21 ?1200 10/03/21 ?1203 10/03/21 ?2154  ?HGB 13.3  --   --   --   --   --   --   ?HCT 41.1  --   --   --   --   --   --   ?PLT 226  --   --   --   --   --   --   ?APTT  --   --  31  --   --   --   --   ?LABPROT  --   --  13.5  --   --   --   --   ?INR  --   --  1.0  --   --   --   --   ?HEPARINUNFRC  --   --   --   --  0.15*  --  0.39  ?CREATININE 0.67  --   --   --   --   --   --   ?TROPONINIHS 24* 1,380*  --  5,681*  --  5,560*  --   ? ? ? ?Estimated Creatinine Clearance: 34.1 mL/min (by C-G formula based on SCr of 0.67 mg/dL). ? ? ?Medical History: ?Past Medical History:  ?Diagnosis Date  ? Hypertension   ? ? ?Assessment: ?Pt is 88 yo female presenting to ED w/ L-sided CP, found with elevated troponin I, trending up. ? ?4/4 1200 HL 0.15, subtherapeutic.  ?4/4 2154 HL 0.39, therapeutic x 1 ? ?Goal of Therapy:  ?Heparin level 0.3-0.7 units/ml ?Monitor platelets by anticoagulation protocol: Yes ?  ?Plan:  ?Will continue heparin infusion at 950 units/hr. Recheck HL in w/ AM labs to confirm. ?CBC daily while on heparin ? ?Otelia Sergeant, PharmD, MBA ?10/03/2021 ?10:43 PM ? ? ? ? ? ?

## 2021-10-03 NOTE — Plan of Care (Signed)

## 2021-10-03 NOTE — Telephone Encounter (Signed)
Patient's family member called to let cardiac rehab know that patient is admitted to the hospital and is not sure what is going on. Told her to call us and keep [place] updated. Appointments will be cancelled for the next week. ?

## 2021-10-03 NOTE — Assessment & Plan Note (Addendum)
Currently non-STEMI, but improving ?

## 2021-10-03 NOTE — Progress Notes (Signed)
?  Progress Note ? ? ?PatientJanessa Perez [id number] DOB: 1924/06/22 DOA: 10/03/2021     0 ?DOS: the patient was seen and examined on 10/03/2021 ?  ?Brief hospital course: ?Alexis Perez is a 88 y.o. Caucasian female with medical history significant for essential hypertension and known CAD who presented to the emergency room with acute onset of left-sided chest pain.  Pt was found to have NSTEMI, heparin gtt started.   ? ?Assessment and Plan: ?* NSTEMI (non-ST elevated myocardial infarction) (HCC) ?--trop 24 on presentation, peaked at 5681. ?--Pt has had a recent cardiac catheterization due to significant progression of anginal symptoms.  During this cardiac catheterization the patient had a 100% occlusion of her right coronary artery with good collaterals from the left and septal.  There was diffuse coronary atherosclerosis of her left main into her left anterior descending artery likely ischemic in nature.  ?--cardiology consulted, with Dr. Gwen Pounds ?Plan: ?--cont heparin gtt for 48 hours ?--cont ASA and plavix ?--cont statin ?--cont Imdur ?--per cardio, Due to her advanced age the patient will continue medication management at this time ? ?Pyuria ?-asymptomatic.  Received ceftriaxone x1 on admission ?Plan: ?--no need to tx with abx ? ?Coronary artery disease ?--see management under NSTEMI ? ?Essential hypertension ?--cont amlodipine, Imdur and Toprol ? ? ? ? ?  ? ?Subjective:  ?Pt reported chest pain resolved.  Denied dysuria or abdominal pain. ? ? ?Physical Exam: ? ?Constitutional: NAD, drowsy, but arousable, oriented ?HEENT: conjunctivae and lids normal, EOMI ?CV: No cyanosis.   ?RESP: normal respiratory effort, on RA ?Extremities: edema in BLE, L>R (chronic) ?SKIN: warm, dry ?Neuro: II - XII grossly intact.   ?Psych: Normal mood and affect.   ? ? ?Data Reviewed: ? ?Family Communication: granddaughter updated at bedside today ? ?Disposition: ?Status is: Inpatient ? ? Planned Discharge Destination: Home likely  tomorrow after finishing heparin gtt and cardio clears ? ? ? ? ?Time spent: 50 minutes ? ?Author: ?Darlin Priestly, MD ?10/03/2021 7:44 PM ? ?For on call review [email or web address].  ?

## 2021-10-03 NOTE — ED Provider Notes (Signed)
? ?[facility] ?Provider Note ? ? ? Event Date/Time  ? First MD Initiated Contact with Patient 10/03/21 0250   ?  (approximate) ? ? ?History  ? ?Chest Pain ? ?History obtained via patient and her daughter ? ?HPI ? ?Alexis Perez is a 88 y.o. female brought to the ED via EMS from home with a chief complaint of chest pain.  Patient reports left-sided chest pressure which began approximately 8 PM.  Took aspirin and nitroglycerin prior to arrival with relief of symptoms.  History of CAD with 70% blockage not amenable to stenting.  Additionally, patient started on Macrobid yesterday for UTI.  Endorses chills.  Denies fever, diaphoresis, cough, shortness of breath, abdominal pain, nausea, vomiting or dizziness. ?  ? ? ?Past Medical History  ? ?Past Medical History:  ?Diagnosis Date  ? Hypertension   ? ? ? ?Active Problem List  ?There are no problems to display for this patient. ? ? ? ?Past Surgical History  ? ?Past Surgical History:  ?Procedure Laterality Date  ? ANKLE FRACTURE SURGERY Left 1999  ? ? ? ?Home Medications  ? ?Prior to Admission medications   ?Medication Sig Start Date End Date Taking? Authorizing Provider  ?amLODipine (NORVASC) 10 MG tablet Take by mouth. 06/25/21   [provider]  ?aspirin EC 81 MG tablet Take 81 mg by mouth daily. Swallow whole.    [provider]  ?Calcium-Vitamin D-Vitamin K [id number] MG-UNT-MCG CHEW Chew by mouth.    [provider]  ?Cholecalciferol 50 MCG (2000 UT) CAPS Take by mouth.    [provider]  ?clopidogrel (PLAVIX) 75 MG tablet Take by mouth. 07/22/21   [provider]  ?diclofenac Sodium (VOLTAREN) 1 % GEL Apply topically. 04/17/21 04/17/22  [provider]  ?ezetimibe (ZETIA) 10 MG tablet Take by mouth. 07/22/21   [provider]  ?isosorbide mononitrate (IMDUR) 30 MG 24 hr tablet Take by mouth. 08/20/21   [provider]  ?losartan (COZAAR) 100 MG tablet Take by mouth. ?Patient  not taking: Reported on 09/01/2021 05/22/18   [provider]  ?meloxicam (MOBIC) 7.5 MG tablet Take by mouth. ?Patient not taking: Reported on 09/01/2021    [provider]  ?metoprolol succinate (TOPROL-XL) 50 MG 24 hr tablet Take by mouth. 08/09/21   [provider]  ?Multiple Vitamins-Minerals (PRESERVISION AREDS 2) CAPS daily. 03/19/19   [provider]  ?nitrofurantoin, macrocrystal-monohydrate, (MACROBID) 100 MG capsule Take 100 mg by mouth 2 (two) times daily. 10/01/21   [provider]  ?nitroGLYCERIN (NITROSTAT) 0.4 MG SL tablet Place under the tongue. 07/07/21   [provider]  ?olmesartan (BENICAR) 40 MG tablet Take 1 tablet by mouth daily. 09/21/21   [provider]  ?Polyethyl Glycol-Propyl Glycol 0.4-0.3 % SOLN Administer 1 drop to both eyes in the morning.    [provider]  ? ? ? ?Allergies  ?Fentanyl, Sodium pentobarbital [pentobarbital], Hydrochlorothiazide, Ciprofloxacin, Codeine, Penicillin g, and Sulfa antibiotics ? ? ?Family History  ?No family history on file. ? ? ?Physical Exam  ?Triage Vital Signs: ?ED Triage Vitals  ?Enc Vitals Group  ?   BP 10/03/21 0025 (!) 155/64  ?   Pulse Rate 10/03/21 0025 95  ?   Resp 10/03/21 0025 18  ?   Temp 10/03/21 0025 97.8 ?F (36.6 ?C)  ?   Temp Source 10/03/21 0025 Oral  ?   SpO2 10/03/21 0019 96 %  ?   Weight 10/03/21 0027 132 lb (59.9  kg)  ?   Height 10/03/21 0027 5' (1.524 m)  ?   Head Circumference --   ?   Peak Flow --   ?   Pain Score 10/03/21 0025 0  ?   Pain Loc --   ?   Pain Edu? --   ?   Excl. in [place]? --   ? ? ?Updated Vital Signs: ?BP (!) 142/68   Pulse 77   Temp 97.8 ?F (36.6 ?C) (Oral)   Resp 16   Ht 5' (1.524 m)   Wt 59.9 kg   SpO2 94%   BMI 25.78 kg/m?  ? ? ?General: Awake, no distress.  Appears younger than stated age. ?CV:  RRR.  Good peripheral perfusion.  ?Resp:  Normal effort.  CTA B. ?Abd:  Nontender.  No distention.  ?Other:  No calf pain or swelling. ? ? ?ED Results /  Procedures / Treatments  ?Labs ?(all labs ordered are listed, but only abnormal results are displayed) ?Labs Reviewed  ?CBC WITH DIFFERENTIAL/PLATELET - Abnormal; Notable for the following components:  ?    Result Value  ? WBC 10.9 (*)   ? Neutro Abs 9.6 (*)   ? Lymphs Abs 0.5 (*)   ? All other components within normal limits  ?COMPREHENSIVE METABOLIC PANEL - Abnormal; Notable for the following components:  ? Sodium 134 (*)   ? Chloride 97 (*)   ? Glucose, Bld 157 (*)   ? Total Protein 8.2 (*)   ? All other components within normal limits  ?URINALYSIS, ROUTINE W REFLEX MICROSCOPIC - Abnormal; Notable for the following components:  ? Color, Urine YELLOW (*)   ? APPearance HAZY (*)   ? Ketones, ur 20 (*)   ? Protein, ur 30 (*)   ? Leukocytes,Ua LARGE (*)   ? All other components within normal limits  ?TROPONIN I (HIGH SENSITIVITY) - Abnormal; Notable for the following components:  ? Troponin I (High Sensitivity) 24 (*)   ? All other components within normal limits  ?TROPONIN I (HIGH SENSITIVITY) - Abnormal; Notable for the following components:  ? Troponin I (High Sensitivity) 1,380 (*)   ? All other components within normal limits  ?CULTURE, BLOOD (ROUTINE X 2)  ?CULTURE, BLOOD (ROUTINE X 2)  ?URINE CULTURE  ?LIPASE, BLOOD  ?LACTIC ACID, PLASMA  ?APTT  ?PROTIME-INR  ? ? ? ?EKG ? ?ED ECG REPORT ?I, Paulette Blanch, the attending physician, personally viewed and interpreted this ECG. ? ? Date: 10/03/2021 ? EKG Time: 0032 ? Rate: 101 ? Rhythm: sinus tachycardia ? Axis: Normal ? Intervals:none ? ST&T Change: ST depression inferior lateral leads ?No visual EKG for comparison; EKG at [facility] dated January 2023 does not mention depressed STs ? ? ?RADIOLOGY ?I have independently visualized and interpreted patient's chest x-ray as well as noted the radiology interpretation: ? ?Chest x-ray: Chronic interstitial lung disease ? ?Official radiology report(s): ?DG Chest 1 View ? ?Result Date: 10/03/2021 ?CLINICAL DATA:  Chest pain EXAM:  CHEST  1 VIEW COMPARISON:  None. FINDINGS: Heart size and pulmonary vascularity are normal. Probable emphysematous changes in the upper lungs. Diffuse interstitial pattern to the lungs, mostly in the periphery. This likely indicates pulmonary fibrosis. No airspace disease or consolidation. No pleural effusions. No pneumothorax. Calcification of the aorta. Degenerative changes in the spine. Thoracolumbar scoliosis convex towards the left. IMPRESSION: Diffuse interstitial pattern to the lungs likely representing chronic interstitial lung disease. Electronically Signed   By: Lucienne Capers M.D.   On: 10/03/2021 00:59   ? ? ?  PROCEDURES: ? ?Critical Care performed: Yes, see critical care procedure note(s) ? ?CRITICAL CARE ?Performed by: Paulette Blanch ? ? ?Total critical care time: 45 minutes ? ?Critical care time was exclusive of separately billable procedures and treating other patients. ? ?Critical care was necessary to treat or prevent imminent or life-threatening deterioration. ? ?Critical care was time spent personally by me on the following activities: development of treatment plan with patient and/or surrogate as well as nursing, discussions with consultants, evaluation of patient's response to treatment, examination of patient, obtaining history from patient or surrogate, ordering and performing treatments and interventions, ordering and review of laboratory studies, ordering and review of radiographic studies, pulse oximetry and re-evaluation of patient's condition.  ? ?.1-3 Lead EKG Interpretation ?Performed by: Paulette Blanch, MD ?Authorized by: Paulette Blanch, MD  ? ?  Interpretation: normal   ?  ECG rate:  77 ?  ECG rate assessment: normal   ?  Rhythm: sinus rhythm   ?  Ectopy: none   ?  Conduction: normal   ?Comments:  ?   Patient placed on cardiac monitor to evaluate for arrhythmias ? ? ?MEDICATIONS ORDERED IN ED: ?Medications  ?aspirin chewable tablet 324 mg (has no administration in time range)   ?nitroGLYCERIN (NITROGLYN) 2 % ointment 0.5 inch (has no administration in time range)  ?heparin bolus via infusion 3,500 Units (has no administration in time range)  ?heparin ADULT infusion 100 units/mL (25000 units/287mL

## 2021-10-03 NOTE — Progress Notes (Signed)
ANTICOAGULATION CONSULT NOTE ? ?Pharmacy Consult for heparin infusion ?Indication: ACS/STEMI ? ?Allergies  ?Allergen Reactions  ? Fentanyl Nausea And Vomiting  ? Sodium Pentobarbital [Pentobarbital] Anaphylaxis  ?  When pt was sedated for surgery  ? Hydrochlorothiazide Other (See Comments)  ?  hyponatremia  ? Ciprofloxacin   ?  Blurry vision, sleepy and make pt sick  ? Codeine Other (See Comments)  ?  Other reaction(s): UNKNOWN  ? Penicillin G Rash  ? Sulfa Antibiotics Rash  ? ? ?Patient Measurements: ?Height: 5' (152.4 cm) ?Weight: 59.9 kg (132 lb) ?IBW/kg (Calculated) : 45.5 ?Heparin Dosing Weight: 57.8 kg ? ?Vital Signs: ?BP: 170/72 (04/04 1200) ?Pulse Rate: 73 (04/04 1200) ? ?Labs: ?Recent Labs  ?  10/03/21 ?0028 10/03/21 ?0256 10/03/21 ?[id number] 10/03/21 ?[id number] 10/03/21 ?1200 10/03/21 ?1203  ?HGB 13.3  --   --   --   --   --   ?HCT 41.1  --   --   --   --   --   ?PLT 226  --   --   --   --   --   ?APTT  --   --  31  --   --   --   ?LABPROT  --   --  13.5  --   --   --   ?INR  --   --  1.0  --   --   --   ?HEPARINUNFRC  --   --   --   --  0.15*  --   ?CREATININE 0.67  --   --   --   --   --   ?TROPONINIHS 24* 1,380*  --  5,681*  --  5,560*  ? ? ? ?Estimated Creatinine Clearance: 34.1 mL/min (by C-G formula based on SCr of 0.67 mg/dL). ? ? ?Medical History: ?Past Medical History:  ?Diagnosis Date  ? Hypertension   ? ? ?Assessment: ?Pt is 88 yo female presenting to ED w/ L-sided CP, found with elevated troponin I, trending up. ? ?4/4 1200 HL 0.15, subtherapeutic.  ? ?Goal of Therapy:  ?Heparin level 0.3-0.7 units/ml ?Monitor platelets by anticoagulation protocol: Yes ?  ?Plan:  ?Heparin level is subtherapeutic. Will give 1700 units bolus and increase heparin infusion to 950 units/hr. Check HL in 8 hrs after rate change. CBC daily while on heparin ? ?Sherilyn Banker, PharmD ?Clinical Pharmacist  ?10/03/2021 1:21 PM  ? ? ? ? ?

## 2021-10-03 NOTE — ED Triage Notes (Addendum)
EMS brings pt in from home for c/o CP; took ASA and NTG PTA with relief of pain; to triage via w/c with no distress noted; pt denies any c/o at present ?

## 2021-10-03 NOTE — Assessment & Plan Note (Addendum)
Patient has been treated with IV heparin, aspirin Plavix and statin.  Patient has completed 48 hours of heparin, will discontinue at this point.  Patient has been arranged to be transferred to [facility] for heart cath per her cardiologist.  We will transfer when bed is available. ?

## 2021-10-03 NOTE — Assessment & Plan Note (Addendum)
Continue Toprol, hold off calcium channel blocker for now. ?

## 2021-10-03 NOTE — Hospital Course (Addendum)
Alexis Perez is a 88 y.o. Caucasian female with medical history significant for essential hypertension and known CAD who presented to the emergency room with acute onset of left-sided chest pain.   ?Her peak troponin was more than 6000, EKG had no ST elevation. ?She was diagnosed with non-STEMI, seen by cardiology.  Continued on dual antiplatelet treatment.  Also started on heparin and continue on statin. ?Patient had additional chest pain overnight, patient cardiologist has called from [facility] and advised patient to be transferred to [facility] for heart cath.  As result, patient will be transferred when baed is available ?

## 2021-10-03 NOTE — H&P (Addendum)
?  ?  ?[facility] ? ? ?PATIENT NAME: Alexis Perez   ? ?MR#:  [id number] ? ?DATE OF BIRTH:  08-08-1923 ? ?DATE OF ADMISSION:  10/03/2021 ? ?PRIMARY CARE PHYSICIAN: Atha Starks, MD  ? ?Patient is coming from: Home  ? ?REQUESTING/REFERRING PHYSICIAN: Chiquita Loth , MD ? ?CHIEF COMPLAINT:  ? ?Chief Complaint  ?Patient presents with  ? Chest Pain  ? ? ?HISTORY OF PRESENT ILLNESS:  ?Alexis Perez is a 88 y.o. Caucasian female with medical history significant for essential hypertension, who presented to the emergency room with acute onset of left-sided chest pain felt this tightness and associated with nausea, mild vomiting and diaphoresis without dyspnea or palpitations.  She admitted to radiation to her left arm and wrist as well as her back.  She admitted to mild chills in the ER without fever.  No cough or wheezing or hemoptysis.  She denied any dysuria, oliguria or hematuria, urgency or frequency or flank pain.  She has been recently treated with Macrodantin for UTI.  No headache or dizziness or blurred vision.  No presyncope or syncope. ? ?ED Course: When she came to the ER BP was 155/64 with otherwise normal vital signs.  Labs revealed a high sensitive troponin I of 24 and later 1380 within 2.5 hours.  CMP was remarkable for sodium 134 and chloride 97.  Blood glucose of 157.  CBC showed WBC of 10.9 with neutrophilia.  UA was still positive with 21-50 WBCs with no bacteria large leukocytes and negative nitrites and 20 ketones.  Blood cultures and urine cultures were drawn. ?EKG as reviewed by me : showed sinus tachycardia with a rate of 101 with first-degree AV block and Q waves anteroseptally as well as inferolateral mild ST segment depression with T wave inversion. ?Imaging: Chest x-ray showed diffuse interstitial pattern likely representing chronic interstitial lung disease ? ?The patient took sublingual nitroglycerin at home and here received 4 baby aspirin, a gram of IV Rocephin, IV heparin bolus and drip as well  as 1 inch of Nitropaste.  She was fairly comfortable during my interview.  She will be admitted to a cardiac telemetry bed for further evaluation and management. ?PAST MEDICAL HISTORY:  ? ?Past Medical History:  ?Diagnosis Date  ? Hypertension   ? ? ?PAST SURGICAL HISTORY:  ? ?Past Surgical History:  ?Procedure Laterality Date  ? ANKLE FRACTURE SURGERY Left 1999  ? ? ?SOCIAL HISTORY:  ? ?Social History  ? ?Tobacco Use  ? Smoking status: Never  ? Smokeless tobacco: Never  ?Substance Use Topics  ? Alcohol use: Never  ? ? ?FAMILY HISTORY:  ? Positive for diabetes mellitus, hypertension and coronary artery disease as well as CVA. ? ?DRUG ALLERGIES:  ? ?Allergies  ?Allergen Reactions  ? Fentanyl Nausea And Vomiting  ? Sodium Pentobarbital [Pentobarbital] Anaphylaxis  ?  When pt was sedated for surgery  ? Hydrochlorothiazide Other (See Comments)  ?  hyponatremia  ? Ciprofloxacin   ?  Blurry vision, sleepy and make pt sick  ? Codeine Other (See Comments)  ?  Other reaction(s): UNKNOWN  ? Penicillin G Rash  ? Sulfa Antibiotics Rash  ? ? ?REVIEW OF SYSTEMS:  ? ?ROS ?As per history of present illness. All pertinent systems were reviewed above. Constitutional, HEENT, cardiovascular, respiratory, GI, GU, musculoskeletal, neuro, psychiatric, endocrine, integumentary and hematologic systems were reviewed and are otherwise negative/unremarkable except for positive findings mentioned above in the HPI. ? ? ?MEDICATIONS AT HOME:  ? ?Prior to Admission medications   ?  Medication Sig Start Date End Date Taking? Authorizing Provider  ?amLODipine (NORVASC) 10 MG tablet Take by mouth. 06/25/21  Yes [provider]  ?aspirin EC 81 MG tablet Take 81 mg by mouth daily. Swallow whole.   Yes [provider]  ?Calcium-Vitamin D-Vitamin K 500-100-40 MG-UNT-MCG CHEW Chew by mouth.   Yes [provider]  ?Cholecalciferol 50 MCG (2000 UT) CAPS Take by mouth.   Yes [provider]  ?clopidogrel (PLAVIX) 75 MG  tablet Take by mouth. 07/22/21  Yes [provider]  ?ezetimibe (ZETIA) 10 MG tablet Take by mouth. 07/22/21  Yes [provider]  ?isosorbide mononitrate (IMDUR) 30 MG 24 hr tablet Take by mouth. 08/20/21  Yes [provider]  ?metoprolol succinate (TOPROL-XL) 50 MG 24 hr tablet Take by mouth. 08/09/21  Yes [provider]  ?Multiple Vitamins-Minerals (PRESERVISION AREDS 2) CAPS daily. 03/19/19  Yes [provider]  ?nitrofurantoin, macrocrystal-monohydrate, (MACROBID) 100 MG capsule Take 100 mg by mouth 2 (two) times daily. 10/01/21  Yes [provider]  ?nitroGLYCERIN (NITROSTAT) 0.4 MG SL tablet Place under the tongue. 07/07/21  Yes [provider]  ?Polyethyl Glycol-Propyl Glycol 0.4-0.3 % SOLN Administer 1 drop to both eyes in the morning.   Yes [provider]  ?diclofenac Sodium (VOLTAREN) 1 % GEL Apply topically. 04/17/21 04/17/22  [provider]  ?losartan (COZAAR) 100 MG tablet Take by mouth. ?Patient not taking: Reported on 09/01/2021 05/22/18   [provider]  ?meloxicam (MOBIC) 7.5 MG tablet Take by mouth. ?Patient not taking: Reported on 09/01/2021    [provider]  ?olmesartan (BENICAR) 40 MG tablet Take 1 tablet by mouth daily. ?Patient not taking: Reported on 10/03/2021 09/21/21   [provider]  ? ?  ? ?VITAL SIGNS:  ?Blood pressure (!) 165/76, pulse 75, temperature 97.8 ?F (36.6 ?C), temperature source Oral, resp. rate 17, height 5' (1.524 m), weight 59.9 kg, SpO2 97 %. ? ?PHYSICAL EXAMINATION:  ?Physical Exam ? ?GENERAL:  88 y.o.-year-old Caucasian female patient lying in the bed with no acute distress.  ?EYES: Pupils equal, round, reactive to light and accommodation. No scleral icterus. Extraocular muscles intact.  ?HEENT: Head atraumatic, normocephalic. Oropharynx and nasopharynx clear.  ?NECK:  Supple, no jugular venous distention. No thyroid enlargement, no tenderness.  ?LUNGS: Normal breath  sounds bilaterally, no wheezing, rales,rhonchi or crepitation. No use of accessory muscles of respiration.  ?CARDIOVASCULAR: Regular rate and rhythm, S1, S2 normal.  3/6 systolic ejection murmur at the left lower sternal border with no rubs, or gallops.  ?ABDOMEN: Soft, nondistended, nontender. Bowel sounds present. No organomegaly or mass.  ?EXTREMITIES: 1+ bilateral lower extremity pitting edema, with no cyanosis, or clubbing.  ?NEUROLOGIC: Cranial nerves II through XII are intact. Muscle strength 5/5 in all extremities. Sensation intact. Gait not checked.  ?PSYCHIATRIC: The patient is alert and oriented x 3.  Normal affect and good eye contact. ?SKIN: No obvious rash, lesion, or ulcer.  ? ?LABORATORY PANEL:  ? ?CBC ?Recent Labs  ?Lab 10/03/21 ?0028  ?WBC 10.9*  ?HGB 13.3  ?HCT 41.1  ?PLT 226  ? ?------------------------------------------------------------------------------------------------------------------ ? ?Chemistries  ?Recent Labs  ?Lab 10/03/21 ?0028  ?NA 134*  ?K 4.0  ?CL 97*  ?CO2 25  ?GLUCOSE 157*  ?BUN 22  ?CREATININE 0.67  ?CALCIUM 9.5  ?AST 25  ?ALT 20  ?ALKPHOS 83  ?BILITOT 0.9  ? ?------------------------------------------------------------------------------------------------------------------ ? ?Cardiac Enzymes ?No results for input(s): TROPONINI in the last 168 hours. ?------------------------------------------------------------------------------------------------------------------ ? ?RADIOLOGY:  ?  DG Chest 1 View ? ?Result Date: 10/03/2021 ?CLINICAL DATA:  Chest pain EXAM: CHEST  1 VIEW COMPARISON:  None. FINDINGS: Heart size and pulmonary vascularity are normal. Probable emphysematous changes in the upper lungs. Diffuse interstitial pattern to the lungs, mostly in the periphery. This likely indicates pulmonary fibrosis. No airspace disease or consolidation. No pleural effusions. No pneumothorax. Calcification of the aorta. Degenerative changes in the spine. Thoracolumbar scoliosis convex towards  the left. IMPRESSION: Diffuse interstitial pattern to the lungs likely representing chronic interstitial lung disease. Electronically Signed   By: Burman Nieves M.D.   On: 10/03/2021 00:59   ? ? ? ?IMPRESSION

## 2021-10-04 ENCOUNTER — Other Ambulatory Visit: Payer: Self-pay

## 2021-10-04 ENCOUNTER — Inpatient Hospital Stay
Admit: 2021-10-04 | Discharge: 2021-10-04 | Disposition: A | Discharge status: Home / Self Care | Payer: Medicare PPO | Attending: Family Medicine | Admitting: Family Medicine

## 2021-10-04 DIAGNOSIS — R8281 Pyuria: Secondary | ICD-10-CM | POA: Diagnosis not present

## 2021-10-04 DIAGNOSIS — I259 Chronic ischemic heart disease, unspecified: Secondary | ICD-10-CM | POA: Diagnosis not present

## 2021-10-04 LAB — ECHOCARDIOGRAM COMPLETE
AR max vel: 1.34 cm2
AV Area VTI: 1.45 cm2
AV Area mean vel: 1.3 cm2
AV Mean grad: 11 mmHg
AV Peak grad: 19.1 mmHg
Ao pk vel: 2.19 m/s
Area-P 1/2: 3.91 cm2
Height: 60 in
MV VTI: 1.78 cm2
S' Lateral: 2.15 cm
Weight: 2112 oz

## 2021-10-04 LAB — LIPID PANEL
Cholesterol: 159 mg/dL (ref 0–200)
HDL: 58 mg/dL (ref >40)
LDL Cholesterol: 92 mg/dL (ref 0–99)
Total CHOL/HDL Ratio: 2.7 RATIO
Triglycerides: 44 mg/dL (ref <150)
VLDL: 9 mg/dL (ref 0–40)

## 2021-10-04 LAB — CBC
HCT: 36 % (ref 36.0–46.0)
Hemoglobin: 11.7 g/dL — ABNORMAL LOW (ref 12.0–15.0)
MCH: 30.1 pg (ref 26.0–34.0)
MCHC: 32.5 g/dL (ref 30.0–36.0)
MCV: 92.5 fL (ref 80.0–100.0)
Platelets: 189 10*3/uL (ref 150–400)
RBC: 3.89 MIL/uL (ref 3.87–5.11)
RDW: 13.7 % (ref 11.5–15.5)
WBC: 4.9 10*3/uL (ref 4.0–10.5)
nRBC: 0 % (ref 0.0–0.2)

## 2021-10-04 LAB — BASIC METABOLIC PANEL
Anion gap: 6 (ref 5–15)
BUN: 22 mg/dL (ref 8–23)
CO2: 29 mmol/L (ref 22–32)
Calcium: 8.7 mg/dL — ABNORMAL LOW (ref 8.9–10.3)
Chloride: 103 mmol/L (ref 98–111)
Creatinine, Ser: 0.57 mg/dL (ref 0.44–1.00)
GFR, Estimated: >60 mL/min (ref >60)
Glucose, Bld: 140 mg/dL — ABNORMAL HIGH (ref 70–99)
Potassium: 3.3 mmol/L — ABNORMAL LOW (ref 3.5–5.1)
Sodium: 138 mmol/L (ref 135–145)

## 2021-10-04 LAB — URINE CULTURE: Culture: <10000 — AB

## 2021-10-04 LAB — MAGNESIUM: Magnesium: 1.9 mg/dL (ref 1.7–2.4)

## 2021-10-04 LAB — HEPARIN LEVEL (UNFRACTIONATED): Heparin Unfractionated: 0.38 IU/mL (ref 0.30–0.70)

## 2021-10-04 MED ORDER — ROSUVASTATIN CALCIUM 10 MG PO TABS
10.0000 mg | ORAL_TABLET | Freq: Every day | ORAL | Status: AC
Start: 2021-10-04 — End: ?

## 2021-10-04 MED ORDER — POTASSIUM CHLORIDE 20 MEQ PO PACK
40.0000 meq | PACK | ORAL | Status: AC
  Administered 2021-10-04 (×2): 40 meq via ORAL
  Filled 2021-10-04 (×2): qty 2

## 2021-10-04 MED ORDER — EZETIMIBE 10 MG PO TABS
10.0000 mg | ORAL_TABLET | Freq: Every day | ORAL | Status: DC
  Administered 2021-10-05: 10 mg via ORAL
  Filled 2021-10-04: qty 1

## 2021-10-04 MED ORDER — ROSUVASTATIN CALCIUM 10 MG PO TABS
10.0000 mg | ORAL_TABLET | Freq: Every day | ORAL | Status: DC
  Administered 2021-10-04: 10 mg via ORAL
  Filled 2021-10-04: qty 1

## 2021-10-04 MED ORDER — HEPARIN (PORCINE) 25000 UT/250ML-% IV SOLN: 950.0000 [IU]/h | INTRAVENOUS | Status: DC

## 2021-10-04 MED ORDER — METOPROLOL SUCCINATE ER 100 MG PO TB24
100.0000 mg | ORAL_TABLET | Freq: Every day | ORAL | Status: DC
  Administered 2021-10-04 – 2021-10-05 (×2): 100 mg via ORAL
  Filled 2021-10-04 (×2): qty 1

## 2021-10-04 NOTE — Progress Notes (Signed)
*  PRELIMINARY RESULTS* ?Echocardiogram ?2D Echocardiogram has been performed. ? ?Alexis Perez Tashawn Greff ?10/04/2021, 10:24 AM ?

## 2021-10-04 NOTE — Progress Notes (Addendum)
Pt complaints of 8/10 CP resolved with 1 nitro sublingual. EKG resulted. NP Foust made aware. Will continue to monitor. ?

## 2021-10-04 NOTE — Discharge Summary (Signed)
?Physician Discharge Summary ?  ?Patient: Alexis Perez MRN: [id number] DOB: [birth year]  ?Admit date:     10/03/2021  ?Discharge date: 10/04/21  ?Discharge Physician: Marrion Coy  ? ?PCP: Atha Starks, MD  ? ?Recommendations at discharge:  ? ?Transfer to [facility] for further treatment ? ?Discharge Diagnoses: ?Principal Problem: ?  NSTEMI (non-ST elevated myocardial infarction) (HCC) ?Active Problems: ?  Pyuria ?  Essential hypertension ?  Coronary artery disease ? ?Resolved Problems: ?  * No resolved hospital problems. * ? ?Hospital Course: ?Markeesha Char is a 88 y.o. Caucasian female with medical history significant for essential hypertension and known CAD who presented to the emergency room with acute onset of left-sided chest pain.   ?Her peak troponin was more than 6000, EKG had no ST elevation. ?She was diagnosed with non-STEMI, seen by cardiology.  Continued on dual antiplatelet treatment.  Also started on heparin and continue on statin. ?Patient had additional chest pain overnight, patient cardiologist has called from [facility] and advised patient to be transferred to [facility] for heart cath.  As result, patient will be transferred when baed is available ? ?Assessment and Plan: ?* NSTEMI (non-ST elevated myocardial infarction) (HCC) ?Patient has been treated with IV heparin, aspirin Plavix and statin.  We will continue heparin for additional 24 hours.  Patient cardiologist has recommended transfer to [facility] for heart cath. ? ?Pyuria ?Urine culture had insignificant growth, UTI ruled out.  Discontinue antibiotics. ? ?Coronary artery disease ?As above. ? ?Essential hypertension ?Continue Toprol, hold off calcium channel blocker for now. ? ? ? ? ?  ? ? ?Consultants: Cardiology ?Procedures performed: None  ?Disposition:  Transfer to [facility] ?Diet recommendation:  ?Discharge Diet Orders (From admission, onward)  ? ?  Start     Ordered  ? 10/04/21 0000  Diet - low sodium heart healthy       ? 10/04/21 1637  ? ?  ?  ? ?  ? ?NPO for  cath ?DISCHARGE MEDICATION: ?Allergies as of 10/04/2021   ? ?   Reactions  ? Fentanyl Nausea And Vomiting  ? Sodium Pentobarbital [pentobarbital] Anaphylaxis  ? When pt was sedated for surgery  ? Hydrochlorothiazide Other (See Comments)  ? hyponatremia  ? Ciprofloxacin   ? Blurry vision, sleepy and make pt sick  ? Codeine Other (See Comments)  ? Other reaction(s): UNKNOWN  ? Penicillin G Rash  ? Sulfa Antibiotics Rash  ? ?  ? ?  ?Medication List  ?  ? ?STOP taking these medications   ? ?amLODipine 10 MG tablet ?Commonly known as: NORVASC ?  ?meloxicam 7.5 MG tablet ?Commonly known as: MOBIC ?  ?nitrofurantoin (macrocrystal-monohydrate) 100 MG capsule ?Commonly known as: MACROBID ?  ?olmesartan 40 MG tablet ?Commonly known as: BENICAR ?  ?Polyethyl Glycol-Propyl Glycol 0.4-0.3 % Soln ?  ? ?  ? ?TAKE these medications   ? ?aspirin EC 81 MG tablet ?Take 81 mg by mouth daily. Swallow whole. ?  ?Calcium-Vitamin D-Vitamin K 500-100-40 MG-UNT-MCG Chew ?Chew by mouth. ?  ?Cholecalciferol 50 MCG (2000 UT) Caps ?Take by mouth. ?  ?clopidogrel 75 MG tablet ?Commonly known as: PLAVIX ?Take by mouth. ?  ?diclofenac Sodium 1 % Gel ?Commonly known as: VOLTAREN ?Apply topically. ?  ?ezetimibe 10 MG tablet ?Commonly known as: ZETIA ?Take by mouth. ?  ?heparin 84536 UT/250ML infusion ?Inject 950 Units/hr into the vein continuous. ?  ?isosorbide mononitrate 30 MG 24 hr tablet ?Commonly known as: IMDUR ?Take by mouth. ?  ?metoprolol succinate 50 MG  24 hr tablet ?Commonly known as: TOPROL-XL ?Take by mouth. ?  ?nitroGLYCERIN 0.4 MG SL tablet ?Commonly known as: NITROSTAT ?Place under the tongue. ?  ?PreserVision AREDS 2 Caps ?daily. ?  ?rosuvastatin 10 MG tablet ?Commonly known as: CRESTOR ?Take 1 tablet (10 mg total) by mouth at bedtime. ?  ? ?  ? ? ?Discharge Exam: ?Filed Weights  ? 10/03/21 0027  ?Weight: 59.9 kg  ? ?General exam: Appears calm and comfortable  ?Respiratory system: Clear to auscultation. Respiratory effort  normal. ?Cardiovascular system: S1 & S2 heard, RRR. No JVD, murmurs, rubs, gallops or clicks. No pedal edema. ?Gastrointestinal system: Abdomen is nondistended, soft and nontender. No organomegaly or masses felt. Normal bowel sounds heard. ?Central nervous system: Alert and oriented. No focal neurological deficits. ?Extremities: Symmetric 5 x 5 power. ?Skin: No rashes, lesions or ulcers ?Psychiatry: Judgement and insight appear normal. Mood & affect appropriate.   ? ?Condition at discharge: good ? ?The results of significant diagnostics from this hospitalization (including imaging, microbiology, ancillary and laboratory) are listed below for reference.  ? ?Imaging Studies: ?DG Chest 1 View ? ?Result Date: 10/03/2021 ?CLINICAL DATA:  Chest pain EXAM: CHEST  1 VIEW COMPARISON:  None. FINDINGS: Heart size and pulmonary vascularity are normal. Probable emphysematous changes in the upper lungs. Diffuse interstitial pattern to the lungs, mostly in the periphery. This likely indicates pulmonary fibrosis. No airspace disease or consolidation. No pleural effusions. No pneumothorax. Calcification of the aorta. Degenerative changes in the spine. Thoracolumbar scoliosis convex towards the left. IMPRESSION: Diffuse interstitial pattern to the lungs likely representing chronic interstitial lung disease. Electronically Signed   By: Burman Nieves M.D.   On: 10/03/2021 00:59   ? ?Microbiology: ?Results for orders placed or performed during the hospital encounter of 10/03/21  ?Culture, blood (routine x 2)     Status: None (Preliminary result)  ? Collection Time: 10/03/21  3:09 AM  ? Specimen: BLOOD  ?Result Value Ref Range Status  ? Specimen Description BLOOD LEFT ARM  Final  ? Special Requests   Final  ?  BOTTLES DRAWN AEROBIC AND ANAEROBIC Blood Culture adequate volume  ? Culture   Final  ?  NO GROWTH 1 DAY ?Performed at [facility], [address], [place], [place] [address] ?  ? Report Status PENDING  Incomplete   ?Culture, blood (routine x 2)     Status: None (Preliminary result)  ? Collection Time: 10/03/21  3:11 AM  ? Specimen: BLOOD  ?Result Value Ref Range Status  ? Specimen Description BLOOD RIGHT ARM  Final  ? Special Requests   Final  ?  BOTTLES DRAWN AEROBIC AND ANAEROBIC Blood Culture adequate volume  ? Culture   Final  ?  NO GROWTH 1 DAY ?Performed at [facility], [address], [place], [place] [address] ?  ? Report Status PENDING  Incomplete  ?Urine Culture     Status: Abnormal  ? Collection Time: 10/03/21  3:15 AM  ? Specimen: Urine, Clean Catch  ?Result Value Ref Range Status  ? Specimen Description   Final  ?  URINE, CLEAN CATCH ?Performed at [facility], [address] [place], [place] [address] ?  ? Special Requests   Final  ?  NONE ?Performed at [facility], [address], [place], [place] [address] ?  ? Culture (A)  Final  ?  <10,000 COLONIES/mL INSIGNIFICANT GROWTH ?Performed at [facility], [address], [place], [place] [address] ?  ?  Report Status 10/04/2021 FINAL  Final  ? ? ?Labs: ?CBC: ?Recent Labs  ?Lab 10/03/21 ?0028 10/04/21 ?[id number]  ?WBC 10.9* 4.9  ?NEUTROABS 9.6*  --   ?HGB 13.3 11.7*  ?HCT 41.1 36.0  ?MCV 94.5 92.5  ?PLT 226 189  ? ?Basic Metabolic Panel: ?Recent Labs  ?Lab 10/03/21 ?0028 10/04/21 ?[id number]  ?NA 134* 138  ?K 4.0 3.3*  ?CL 97* 103  ?CO2 25 29  ?GLUCOSE 157* 140*  ?BUN 22 22  ?CREATININE 0.67 0.57  ?CALCIUM 9.5 8.7*  ?MG  --  1.9  ? ?Liver Function Tests: ?Recent Labs  ?Lab 10/03/21 ?0028  ?AST 25  ?ALT 20  ?ALKPHOS 83  ?BILITOT 0.9  ?PROT 8.2*  ?ALBUMIN 4.4  ? ?CBG: ?No results for input(s): GLUCAP in the last 168 hours. ? ?Discharge time spent: greater than 30 minutes. ? ?Signed: ?Marrion Coyekui Itzabella Sorrels, MD ?[facility] ?10/04/2021 ?

## 2021-10-04 NOTE — Plan of Care (Signed)
  Problem: Health Behavior/Discharge Planning: Goal: Ability to manage health-related needs will improve Outcome: Progressing   Problem: Clinical Measurements: Goal: Will remain free from infection Outcome: Progressing   Problem: Clinical Measurements: Goal: Respiratory complications will improve Outcome: Progressing   Problem: Clinical Measurements: Goal: Cardiovascular complication will be avoided Outcome: Progressing   Problem: Activity: Goal: Risk for activity intolerance will decrease Outcome: Progressing   Problem: Pain Managment: Goal: General experience of comfort will improve Outcome: Progressing   Problem: Safety: Goal: Ability to remain free from injury will improve Outcome: Progressing   

## 2021-10-04 NOTE — Progress Notes (Signed)
[facility] Cardiology [facility] Encounter Note ? ?Patient: Alexis Perez / Admit Date: 10/03/2021 / Date of Encounter: 10/04/2021, 3:18 PM ? ? ?Subjective: ?Patient did have some chest discomfort last night into a.m.  This was relieved by nitrates.  The patient has had myocardial infarction with elevated troponin consistent with her previous cardiovascular disease and severe left main to LAD stenosis with occlusion of proximal right coronary artery.  This was previously deemed medical management at this time and will continue.  Patient is tolerating her medications ? ?Review of Systems: ?Positive for: Chest pain ?Negative for: Vision change, hearing change, syncope, dizziness, nausea, vomiting,diarrhea, bloody stool, stomach pain, cough, congestion, diaphoresis, urinary frequency, urinary pain,skin lesions, skin rashes ?Others previously listed ? ?Objective: ?Telemetry: Normal sinus rhythm ?Physical Exam: Blood pressure 138/74, pulse 96, temperature 98.1 ?F (36.7 ?C), resp. rate 18, height 5' (1.524 m), weight 59.9 kg, SpO2 94 %. Body mass index is 25.78 kg/m?. ?General: Well developed, well nourished, in no acute distress. ?Head: Normocephalic, atraumatic, sclera non-icteric, no xanthomas, nares are without discharge. ?Neck: No apparent masses ?Lungs: Normal respirations with no wheezes, no rhonchi, no rales , no crackles  ? Heart: Regular rate and rhythm, normal S1 S2, no murmur, no rub, no gallop, PMI is normal size and placement, carotid upstroke normal without bruit, jugular venous pressure normal ?Abdomen: Soft, non-tender, non-distended with normoactive bowel sounds. No hepatosplenomegaly. Abdominal aorta is normal size without bruit ?Extremities: No edema, no clubbing, no cyanosis, no ulcers,  ?Peripheral: 2+ radial, 2+ femoral, 2+ dorsal pedal pulses ?Neuro: Alert and oriented. Moves all extremities spontaneously. ?Psych:  Responds to questions appropriately with a normal affect. ? ? ?Intake/Output Summary  (Last 24 hours) at 10/04/2021 1518 ?Last data filed at 10/04/2021 0000 ?Gross per 24 hour  ?Intake 446.2 ml  ?Output --  ?Net 446.2 ml  ? ? ?Inpatient Medications:  ? amLODipine  10 mg Oral Daily  ? aspirin EC  81 mg Oral Daily  ? cholecalciferol  2,000 Units Oral Daily  ? clopidogrel  75 mg Oral Daily  ? [START ON 10/05/2021] ezetimibe  10 mg Oral Daily  ? isosorbide mononitrate  30 mg Oral Daily  ? metoprolol succinate  50 mg Oral Daily  ? multivitamin-lutein   Oral Daily  ? polyvinyl alcohol  1 drop Both Eyes Daily  ? rosuvastatin  10 mg Oral QHS  ? ?Infusions:  ? cefTRIAXone (ROCEPHIN)  IV 1 g (10/04/21 0547)  ? heparin 950 Units/hr (10/04/21 0056)  ? ? ?Labs: ?Recent Labs  ?  10/03/21 ?0028 10/04/21 ?9417  ?NA 134* 138  ?K 4.0 3.3*  ?CL 97* 103  ?CO2 25 29  ?GLUCOSE 157* 140*  ?BUN 22 22  ?CREATININE 0.67 0.57  ?CALCIUM 9.5 8.7*  ?MG  --  1.9  ? ?Recent Labs  ?  10/03/21 ?0028  ?AST 25  ?ALT 20  ?ALKPHOS 83  ?BILITOT 0.9  ?PROT 8.2*  ?ALBUMIN 4.4  ? ?Recent Labs  ?  10/03/21 ?0028 10/04/21 ?4081  ?WBC 10.9* 4.9  ?NEUTROABS 9.6*  --   ?HGB 13.3 11.7*  ?HCT 41.1 36.0  ?MCV 94.5 92.5  ?PLT 226 189  ? ?No results for input(s): CKTOTAL, CKMB, TROPONINI in the last 72 hours. ?Invalid input(s): POCBNP ?No results for input(s): HGBA1C in the last 72 hours.  ? ?Weights: ?Filed Weights  ? 10/03/21 0027  ?Weight: 59.9 kg  ? ? ? ?Radiology/Studies:  ?DG Chest 1 View ? ?Result Date: 10/03/2021 ?CLINICAL DATA:  Chest  pain EXAM: CHEST  1 VIEW COMPARISON:  None. FINDINGS: Heart size and pulmonary vascularity are normal. Probable emphysematous changes in the upper lungs. Diffuse interstitial pattern to the lungs, mostly in the periphery. This likely indicates pulmonary fibrosis. No airspace disease or consolidation. No pleural effusions. No pneumothorax. Calcification of the aorta. Degenerative changes in the spine. Thoracolumbar scoliosis convex towards the left. IMPRESSION: Diffuse interstitial pattern to the lungs likely  representing chronic interstitial lung disease. Electronically Signed   By: Burman Nieves M.D.   On: 10/03/2021 00:59   ? ? Assessment and Recommendation  ?88 y.o. female with known significant coronary artery disease status post previous cardiac catheterization with occluded right coronary artery with collaterals from the left in addition to severe disease in the left main to LAD needing medical management. ?1.  Continue nitrates including isosorbide at 30 mg and would consider increasing if necessary ?2.  Dual antiplatelet therapy with aspirin and Plavix for further risk reduction cardiovascular event ?3.  High intensity cholesterol therapy ?4.  Amlodipine and beta-blocker for heart rate blood pressure control for further risk reduction of angina and increase metoprolol today for improved symptoms ?5.  Begin cardiac rehabilitation ?6.  No further cardiac diagnostics necessary at this time ? ?Signed, ?Arnoldo Hooker M.D. FACC ? ?

## 2021-10-04 NOTE — Plan of Care (Signed)

## 2021-10-04 NOTE — Progress Notes (Signed)
ANTICOAGULATION CONSULT NOTE ? ?Pharmacy Consult for heparin infusion ?Indication: ACS/STEMI ? ?Allergies  ?Allergen Reactions  ? Fentanyl Nausea And Vomiting  ? Sodium Pentobarbital [Pentobarbital] Anaphylaxis  ?  When pt was sedated for surgery  ? Hydrochlorothiazide Other (See Comments)  ?  hyponatremia  ? Ciprofloxacin   ?  Blurry vision, sleepy and make pt sick  ? Codeine Other (See Comments)  ?  Other reaction(s): UNKNOWN  ? Penicillin G Rash  ? Sulfa Antibiotics Rash  ? ? ?Patient Measurements: ?Height: 5' (152.4 cm) ?Weight: 59.9 kg (132 lb) ?IBW/kg (Calculated) : 45.5 ?Heparin Dosing Weight: 57.8 kg ? ?Vital Signs: ?Temp: 98.1 ?F (36.7 ?C) (04/05 0341) ?Temp Source: Oral (04/05 0057) ?BP: 138/74 (04/05 0438) ?Pulse Rate: 96 (04/05 0438) ? ?Labs: ?Recent Labs  ?  10/03/21 ?0028 10/03/21 ?0256 10/03/21 ?3235 10/03/21 ?5732 10/03/21 ?1200 10/03/21 ?1203 10/03/21 ?2154 10/04/21 ?2025  ?HGB 13.3  --   --   --   --   --   --  11.7*  ?HCT 41.1  --   --   --   --   --   --  36.0  ?PLT 226  --   --   --   --   --   --  189  ?APTT  --   --  31  --   --   --   --   --   ?LABPROT  --   --  13.5  --   --   --   --   --   ?INR  --   --  1.0  --   --   --   --   --   ?HEPARINUNFRC  --   --   --   --  0.15*  --  0.39 0.38  ?CREATININE 0.67  --   --   --   --   --   --  0.57  ?TROPONINIHS 24* 1,380*  --  5,681*  --  5,560*  --   --   ? ?Heparin Dosing Weight: 57.8 kg ? ?Estimated Creatinine Clearance: 34.1 mL/min (by C-G formula based on SCr of 0.57 mg/dL). ? ? ?Medical History: ?Past Medical History:  ?Diagnosis Date  ? Hypertension   ? ? ?Assessment: ?Pt is 88 yo female w/ h/o HTN, HLD, CAD presenting to ED w/ L-sided CP, found with elevated troponin I, trending up. Pharmacy consulted for mgmt of heparin gtt ISO NSTEMI. ? ?Date Time  HL Rate/Comment ?4/04 1200 0.15 Subtherapeutic; 750>950 un/hr ?4/04 2154 0.39 Therapeutic x1; 950 un/hr ?4/05 0552 0.38 Therapeutic x 2  ? ?Baseline Labs: ?aPTT - 31s ?INR - 1.0 ?Hgb -  13.3 ?Plts - 226 ?Trop - 42>7062 ? ?Goal of Therapy:  ?Heparin level 0.3-0.7 units/ml ?Monitor platelets by anticoagulation protocol: Yes ?  ?Plan: Planning heparin gtt x48hrs per cards (started 4/04 0400>>___). Continue ASA/Plavix. ?Heparin level is therapeutic x 2. Continue heparin infusion at 950 units/hr.  ?Recheck HL in AM daily while therapeutic ?CBC daily while on heparin ? ?Otelia Sergeant, PharmD, MBA ?10/04/2021 ?6:47 AM ? ? ? ? ? ?

## 2021-10-05 ENCOUNTER — Encounter: Payer: Self-pay | Admitting: *Deleted

## 2021-10-05 DIAGNOSIS — I214 Non-ST elevation (NSTEMI) myocardial infarction: Secondary | ICD-10-CM

## 2021-10-05 LAB — CBC
HCT: 37 % (ref 36.0–46.0)
Hemoglobin: 12 g/dL (ref 12.0–15.0)
MCH: 29.9 pg (ref 26.0–34.0)
MCHC: 32.4 g/dL (ref 30.0–36.0)
MCV: 92 fL (ref 80.0–100.0)
Platelets: 195 10*3/uL (ref 150–400)
RBC: 4.02 MIL/uL (ref 3.87–5.11)
RDW: 13.6 % (ref 11.5–15.5)
WBC: 5.1 10*3/uL (ref 4.0–10.5)
nRBC: 0 % (ref 0.0–0.2)

## 2021-10-05 LAB — HEPARIN LEVEL (UNFRACTIONATED): Heparin Unfractionated: 0.34 IU/mL (ref 0.30–0.70)

## 2021-10-05 LAB — POTASSIUM: Potassium: 3.6 mmol/L (ref 3.5–5.1)

## 2021-10-05 LAB — MAGNESIUM: Magnesium: 2 mg/dL (ref 1.7–2.4)

## 2021-10-05 MED ORDER — HEPARIN (PORCINE) 25000 UT/250ML-% IV SOLN
950.0000 [IU]/h | INTRAVENOUS | Status: DC
  Administered 2021-10-05: 950 [IU]/h via INTRAVENOUS

## 2021-10-05 NOTE — Progress Notes (Signed)
Cardiac Individual Treatment Plan ? ?Patient Details  ?Name: Alexis Perez ?MRN: [id number] ?Date of Birth: September 03, 1923 ?Referring Provider:   ?Flowsheet Row Cardiac Rehab from 09/11/2021 in [facility] Cardiac and Pulmonary Rehab  ?Referring Provider [name]  ? ?  ? ? ?Initial Encounter Date:  ?Flowsheet Row Cardiac Rehab from 09/11/2021 in [facility] Cardiac and Pulmonary Rehab  ?Date 09/11/21  ? ?  ? ? ?Visit Diagnosis: NSTEMI (non-ST elevation myocardial infarction) (HCC) ? ?Patient's Home Medications on Admission: ?No current facility-administered medications for this visit. ? ?Current Outpatient Medications:  ?  heparin 25053 UT/250ML infusion, Inject 950 Units/hr into the vein continuous., Disp: , Rfl:  ?  rosuvastatin (CRESTOR) 10 MG tablet, Take 1 tablet (10 mg total) by mouth at bedtime., Disp: , Rfl:  ? ?Facility-Administered Medications Ordered in Other Visits:  ?  acetaminophen (TYLENOL) tablet 650 mg, 650 mg, Oral, Q4H PRN, Mansy, Jan A, MD ?  ALPRAZolam Prudy Feeler) tablet 0.25 mg, 0.25 mg, Oral, BID PRN, Mansy, Jan A, MD ?  amLODipine (NORVASC) tablet 10 mg, 10 mg, Oral, Daily, Mansy, Jan A, MD, 10 mg at 10/05/21 0844 ?  aspirin EC tablet 81 mg, 81 mg, Oral, Daily, Mansy, Jan A, MD, 81 mg at 10/05/21 0844 ?  cholecalciferol (VITAMIN D3) tablet 2,000 Units, 2,000 Units, Oral, Daily, Mansy, Jan A, MD, 2,000 Units at 10/04/21 0900 ?  clopidogrel (PLAVIX) tablet 75 mg, 75 mg, Oral, Daily, Mansy, Jan A, MD, 75 mg at 10/05/21 0847 ?  ezetimibe (ZETIA) tablet 10 mg, 10 mg, Oral, Daily, Marrion Coy, MD, 10 mg at 10/05/21 0844 ?  heparin ADULT infusion 100 units/mL (25000 units/269mL), 950 Units/hr, Intravenous, Continuous, Ronnald Ramp, RPH, Last Rate: 9.5 mL/hr at 10/05/21 0850, 950 Units/hr at 10/05/21 0850 ?  isosorbide mononitrate (IMDUR) 24 hr tablet 30 mg, 30 mg, Oral, Daily, Mansy, Jan A, MD, 30 mg at 10/05/21 9767 ?  magnesium hydroxide (MILK OF MAGNESIA) suspension 30 mL, 30 mL, Oral, Daily PRN, Mansy, Jan A, MD ?   metoprolol succinate (TOPROL-XL) 24 hr tablet 100 mg, 100 mg, Oral, Daily, Lamar Blinks, MD, 100 mg at 10/05/21 0844 ?  morphine (PF) 2 MG/ML injection 2 mg, 2 mg, Intravenous, Q2H PRN, Mansy, Vernetta Honey, MD ?  multivitamin-lutein (OCUVITE-LUTEIN) capsule, , Oral, Daily, Mansy, Jan A, MD, 1 capsule at 10/05/21 0845 ?  nitroGLYCERIN (NITROSTAT) SL tablet 0.4 mg, 0.4 mg, Sublingual, Q5 Min x 3 PRN, Mansy, Jan A, MD, 0.4 mg at 10/04/21 0433 ?  ondansetron (ZOFRAN) injection 4 mg, 4 mg, Intravenous, Q6H PRN, Mansy, Jan A, MD ?  polyvinyl alcohol (LIQUIFILM TEARS) 1.4 % ophthalmic solution 1 drop, 1 drop, Both Eyes, Daily, Mansy, Jan A, MD ?  rosuvastatin (CRESTOR) tablet 10 mg, 10 mg, Oral, QHS, Marrion Coy, MD, 10 mg at 10/04/21 2226 ?  traZODone (DESYREL) tablet 25 mg, 25 mg, Oral, QHS PRN, Mansy, Vernetta Honey, MD ? ?Past Medical History: ?Past Medical History:  ?Diagnosis Date  ? Hypertension   ? ? ?Tobacco Use: ?Social History  ? ?Tobacco Use  ?Smoking Status Never  ?Smokeless Tobacco Never  ? ? ?Labs: ?Review Flowsheet   ? ?  ?  Latest Ref Rng & Units 10/04/2021  ?Labs for ITP Cardiac and Pulmonary Rehab  ?Cholestrol 0 - 200 mg/dL 341    ?LDL (calc) 0 - 99 mg/dL 92    ?HDL-C >40 mg/dL 58    ?Trlycerides <150 mg/dL 44    ?  ? ? Multiple values from  one day are sorted in reverse-chronological order  ?  ?  ? ? ? ?Exercise Target Goals: ?Exercise Program Goal: ?Individual exercise prescription set using results from initial 6 min walk test and THRR while considering  patient?s activity barriers and safety.  ? ?Exercise Prescription Goal: ?Initial exercise prescription builds to 30-45 minutes a day of aerobic activity, 2-3 days per week.  Home exercise guidelines will be given to patient during program as part of exercise prescription that the participant will acknowledge. ? ? ?Education: Aerobic Exercise: ?- Group verbal and visual presentation on the components of exercise prescription. Introduces F.I.T.T principle from ACSM  for exercise prescriptions.  Reviews F.I.T.T. principles of aerobic exercise including progression. Written material given at graduation. ? ? ?Education: Resistance Exercise: ?- Group verbal and visual presentation on the components of exercise prescription. Introduces F.I.T.T principle from ACSM for exercise prescriptions  Reviews F.I.T.T. principles of resistance exercise including progression. Written material given at graduation. ? ?  ?Education: Exercise & Equipment Safety: ?- Individual verbal instruction and demonstration of equipment use and safety with use of the equipment. ?Flowsheet Row Cardiac Rehab from 09/28/2021 in [facility] Cardiac and Pulmonary Rehab  ?Date 09/11/21  ?Educator AS  ?Instruction Review Code 1- Verbalizes Understanding  ? ?  ? ? ?Education: Exercise Physiology & General Exercise Guidelines: ?- Group verbal and written instruction with models to review the exercise physiology of the cardiovascular system and associated critical values. Provides general exercise guidelines with specific guidelines to those with heart or lung disease.  ? ? ?Education: Flexibility, Balance, Mind/Body Relaxation: ?- Group verbal and visual presentation with interactive activity on the components of exercise prescription. Introduces F.I.T.T principle from ACSM for exercise prescriptions. Reviews F.I.T.T. principles of flexibility and balance exercise training including progression. Also discusses the mind body connection.  Reviews various relaxation techniques to help reduce and manage stress (i.e. Deep breathing, progressive muscle relaxation, and visualization). Balance handout provided to take home. Written material given at graduation. ?Flowsheet Row Cardiac Rehab from 09/28/2021 in [facility] Cardiac and Pulmonary Rehab  ?Date 09/21/21  ?Educator AS  ?Instruction Review Code 1- Verbalizes Understanding  ? ?  ? ? ?Activity Barriers & Risk Stratification: ? Activity Barriers & Cardiac Risk Stratification - 09/01/21  1014   ? ?  ? Activity Barriers & Cardiac Risk Stratification  ? Activity Barriers Assistive Device;Arthritis;Chest Pain/Angina   ? Cardiac Risk Stratification Moderate   ? ?  ?  ? ?  ? ? ?6 Minute Walk: ? 6 Minute Walk   ? ? Row Name 09/11/21 1624  ?  ?  ?  ? 6 Minute Walk  ? Phase Initial    ? Distance 445 feet    ? Walk Time 5.75 minutes    ? # of Rest Breaks 1    ? MPH 0.9    ? METS 1.5    ? RPE 11    ? Perceived Dyspnea  0    ? VO2 Peak 0.1    ? Symptoms No    ? Resting HR 85 bpm    ? Resting BP 110/56    ? Resting Oxygen Saturation  99 %    ? Exercise Oxygen Saturation  during 6 min walk 99 %    ? Max Ex. HR 95 bpm    ? Max Ex. BP 128/64    ? 2 Minute Post BP 112/64    ? ?  ?  ? ?  ? ? ?Oxygen Initial Assessment: ? ? ?  Oxygen Re-Evaluation: ? ? ?Oxygen Discharge (Final Oxygen Re-Evaluation): ? ? ?Initial Exercise Prescription: ? Initial Exercise Prescription - 09/11/21 1600   ? ?  ? Date of Initial Exercise RX and Referring Provider  ? Date 09/11/21   ? Referring Provider Cecile HearingMoskowitz   ?  ? Oxygen  ? Maintain Oxygen Saturation 88% or higher   ?  ? Treadmill  ? MPH 0.8   ? Grade 0   ? Minutes 15   ? METs 1.4   ?  ? REL-XR  ? Level 1   ? Speed 50   ? Minutes 15   ? METs 1.5   ?  ? T5 Nustep  ? Level 1   ? SPM 80   ? Minutes 15   ? METs 1.5   ?  ? Track  ? Laps 10   ? Minutes 15   ? METs 1.5   ?  ? Prescription Details  ? Frequency (times per week) 3   ? Duration Progress to 30 minutes of continuous aerobic without signs/symptoms of physical distress   ?  ? Intensity  ? THRR 40-80% of Max Heartrate 101-117   ? Ratings of Perceived Exertion 11-13   ? Perceived Dyspnea 0-4   ?  ? Resistance Training  ? Training Prescription Yes   ? Weight 3 lb   ? Reps 10-15   ? ?  ?  ? ?  ? ? ?Perform Capillary Blood Glucose checks as needed. ? ?Exercise Prescription Changes: ? ? Exercise Prescription Changes   ? ? Row Name 09/11/21 1600 09/25/21 1100  ?  ?  ?  ?  ? Response to Exercise  ? Blood Pressure (Admit) 110/56 112/58     ?  Blood Pressure (Exercise) 128/64 116/68     ? Blood Pressure (Exit) 112/64 114/66     ? Heart Rate (Admit) 85 bpm 67 bpm     ? Heart Rate (Exercise) 95 bpm 90 bpm     ? Heart Rate (Exit) 89 bpm 82 bpm

## 2021-10-05 NOTE — Progress Notes (Signed)
[facility] ? ?Patient: Alexis Perez / Admit Date: 10/03/2021 / Date of Encounter: 10/05/2021, 9:24 AM ? ? ?Subjective: ?Patient did have some chest discomfort yesterday which was resolved with nitroglycerin    The patient has had myocardial infarction with elevated troponin consistent with her previous cardiovascular disease and severe left main to LAD stenosis with occlusion of proximal right coronary artery.  This was previously deemed medical management at this time and will continue.  Patient is tolerating her medications.  We increase medication management with increase beta-blocker dosage for better heart rate and blood pressure control. ? ?[facility] for further potential intervention.  They have discussed the possibility of continuation of medication management as per above.  In addition to that there is possibility of PCI and stent placement or other changes that can be made.  We also discussed the possibility of transfer for which the patient is agreeable at this time and [facility]. ? ?Review of Systems: ?Positive for: None ?Negative for: Vision change, hearing change, syncope, dizziness, nausea, vomiting,diarrhea, bloody stool, stomach pain, cough, congestion, diaphoresis, urinary frequency, urinary pain,skin lesions, skin rashes ?Others previously listed ? ?Objective: ?Telemetry: Normal sinus rhythm ?Physical Exam: Blood pressure (!) 152/67, pulse 68, temperature 97.8 ?F (36.6 ?C), resp. rate 20, height 5' (1.524 m), weight 59.9 kg, SpO2 96 %. Body mass index is 25.78 kg/m?. ?General: Well developed, well nourished, in no acute distress. ?Head: Normocephalic, atraumatic, sclera non-icteric, no xanthomas, nares are without discharge. ?Neck: No apparent masses ?Lungs: Normal respirations with no wheezes, no rhonchi, no rales , no crackles  ? Heart: Regular rate and rhythm, normal S1 S2, no murmur, no rub, no gallop, PMI is normal size  and placement, carotid upstroke normal without bruit, jugular venous pressure normal ?Abdomen: Soft, non-tender, non-distended with normoactive bowel sounds. No hepatosplenomegaly. Abdominal aorta is normal size without bruit ?Extremities: No edema, no clubbing, no cyanosis, no ulcers,  ?Peripheral: 2+ radial, 2+ femoral, 2+ dorsal pedal pulses ?Neuro: Alert and oriented. Moves all extremities spontaneously. ?Psych:  Responds to questions appropriately with a normal affect. ? ? ?Intake/Output Summary (Last 24 hours) at 10/05/2021 0924 ?Last data filed at 10/05/2021 0400 ?Gross per 24 hour  ?Intake 204.93 ml  ?Output --  ?Net 204.93 ml  ? ? ? ?Inpatient Medications:  ? amLODipine  10 mg Oral Daily  ? aspirin EC  81 mg Oral Daily  ? cholecalciferol  2,000 Units Oral Daily  ? clopidogrel  75 mg Oral Daily  ? ezetimibe  10 mg Oral Daily  ? isosorbide mononitrate  30 mg Oral Daily  ? metoprolol succinate  100 mg Oral Daily  ? multivitamin-lutein   Oral Daily  ? polyvinyl alcohol  1 drop Both Eyes Daily  ? rosuvastatin  10 mg Oral QHS  ? ?Infusions:  ? heparin 950 Units/hr (10/05/21 0850)  ? ? ?Labs: ?Recent Labs  ?  10/03/21 ?0028 10/04/21 ?[id number] 10/05/21 ?[id number]  ?NA 134* 138  --   ?K 4.0 3.3* 3.6  ?CL 97* 103  --   ?CO2 25 29  --   ?GLUCOSE 157* 140*  --   ?BUN 22 22  --   ?CREATININE 0.67 0.57  --   ?CALCIUM 9.5 8.7*  --   ?MG  --  1.9 2.0  ? ? ?Recent Labs  ?  10/03/21 ?0028  ?AST 25  ?ALT 20  ?ALKPHOS 83  ?BILITOT 0.9  ?PROT 8.2*  ?ALBUMIN 4.4  ? ? ?  Recent Labs  ?  10/03/21 ?0028 10/04/21 ?9379 10/05/21 ?0240  ?WBC 10.9* 4.9 5.1  ?NEUTROABS 9.6*  --   --   ?HGB 13.3 11.7* 12.0  ?HCT 41.1 36.0 37.0  ?MCV 94.5 92.5 92.0  ?PLT 226 189 195  ? ? ?No results for input(s): CKTOTAL, CKMB, TROPONINI in the last 72 hours. ?Invalid input(s): POCBNP ?No results for input(s): HGBA1C in the last 72 hours.  ? ?Weights: ?Filed Weights  ? 10/03/21 0027  ?Weight: 59.9 kg  ? ? ? ?Radiology/Studies:  ?DG Chest 1 View ? ?Result Date:  10/03/2021 ?CLINICAL DATA:  Chest pain EXAM: CHEST  1 VIEW COMPARISON:  None. FINDINGS: Heart size and pulmonary vascularity are normal. Probable emphysematous changes in the upper lungs. Diffuse interstitial pattern to the lungs, mostly in the periphery. This likely indicates pulmonary fibrosis. No airspace disease or consolidation. No pleural effusions. No pneumothorax. Calcification of the aorta. Degenerative changes in the spine. Thoracolumbar scoliosis convex towards the left. IMPRESSION: Diffuse interstitial pattern to the lungs likely representing chronic interstitial lung disease. Electronically Signed   By: Burman Nieves M.D.   On: 10/03/2021 00:59  ? ?ECHOCARDIOGRAM COMPLETE ? ?Result Date: 10/04/2021 ?   ECHOCARDIOGRAM REPORT   Patient Name:   Alexis Perez Date of Exam: 10/04/2021 Medical Rec #:  [id number]     Height:       60.0 in Accession #:    [id number]    Weight:       132.0 lb Date of Birth:  1924/06/18      BSA:          1.564 m? Patient Age:    88 years      BP:           138/74 mmHg Patient Gender: F             HR:           74 bpm. Exam Location:  [facility] Procedure: 2D Echo, Color Doppler and Cardiac Doppler Indications:     I21.4 NSTEMI  History:         Patient has no prior history of Echocardiogram examinations.                  Risk Factors:Hypertension.  Sonographer:     Humphrey Rolls Referring Phys:  [id number] JAN A MANSY Diagnosing Phys: Arnoldo Hooker MD  Sonographer Comments: Suboptimal subcostal window. IMPRESSIONS  1. Left ventricular ejection fraction, by estimation, is 60 to 65%. The left ventricle has normal function. The left ventricle has no regional wall motion abnormalities. There is moderate left ventricular hypertrophy. Left ventricular diastolic parameters are consistent with Grade II diastolic dysfunction (pseudonormalization).  2. Right ventricular systolic function is normal. The right ventricular size is normal.  3. The mitral valve is normal in structure. Mild to moderate mitral  valve regurgitation.  4. The aortic valve is calcified. Aortic valve regurgitation is trivial. Mild aortic valve stenosis. FINDINGS  Left Ventricle: Left ventricular ejection fraction, by estimation, is 60 to 65%. The left ventricle has normal function. The left ventricle has no regional wall motion abnormalities. The left ventricular internal cavity size was normal in size. There is  moderate left ventricular hypertrophy. Left ventricular diastolic parameters are consistent with Grade II diastolic dysfunction (pseudonormalization). Right Ventricle: The right ventricular size is normal. No increase in right ventricular wall thickness. Right ventricular systolic function is normal. Left Atrium: Left atrial size was normal in size. Right Atrium: Right atrial size was  normal in size. Pericardium: There is no evidence of pericardial effusion. Mitral Valve: The mitral valve is normal in structure. Mild to moderate mitral valve regurgitation. MV peak gradient, 10.5 mmHg. The mean mitral valve gradient is 5.0 mmHg. Tricuspid Valve: The tricuspid valve is normal in structure. Tricuspid valve regurgitation is mild. Aortic Valve: The aortic valve is calcified. Aortic valve regurgitation is trivial. Mild aortic stenosis is present. Aortic valve mean gradient measures 11.0 mmHg. Aortic valve peak gradient measures 19.1 mmHg. Aortic valve area, by VTI measures 1.45 cm?. Pulmonic Valve: The pulmonic valve was normal in structure. Pulmonic valve regurgitation is trivial. Aorta: The aortic root and ascending aorta are structurally normal, with no evidence of dilitation. IAS/Shunts: No atrial level shunt detected by color flow Doppler.  LEFT VENTRICLE PLAX 2D LVIDd:         3.32 cm   Diastology LVIDs:         2.15 cm   LV e' medial:    4.13 cm/s LV PW:         1.05 cm   LV E/e' medial:  24.7 LV IVS:        0.98 cm   LV e' lateral:   5.55 cm/s LVOT diam:     2.00 cm   LV E/e' lateral: 18.4 LV SV:         64 LV SV Index:   41 LVOT  Area:     3.14 cm?  RIGHT VENTRICLE RV Basal diam:  3.08 cm LEFT ATRIUM             Index        RIGHT ATRIUM          Index LA diam:        3.30 cm 2.11 cm/m?   RA Area:     6.93 cm? LA Vol (A2C):   41.4 ml 26.46

## 2021-10-05 NOTE — Progress Notes (Signed)
ANTICOAGULATION CONSULT NOTE ? ?Pharmacy Consult for heparin infusion ?Indication: ACS/STEMI ? ?Allergies  ?Allergen Reactions  ? Fentanyl Nausea And Vomiting  ? Sodium Pentobarbital [Pentobarbital] Anaphylaxis  ?  When pt was sedated for surgery  ? Hydrochlorothiazide Other (See Comments)  ?  hyponatremia  ? Ciprofloxacin   ?  Blurry vision, sleepy and make pt sick  ? Codeine Other (See Comments)  ?  Other reaction(s): UNKNOWN  ? Penicillin G Rash  ? Sulfa Antibiotics Rash  ? ? ?Patient Measurements: ?Height: 5' (152.4 cm) ?Weight: 59.9 kg (132 lb) ?IBW/kg (Calculated) : 45.5 ?Heparin Dosing Weight: 57.8 kg ? ?Vital Signs: ?Temp: 97.8 ?F (36.6 ?C) (04/06 2035) ?BP: 146/71 (04/06 0437) ?Pulse Rate: 70 (04/06 0437) ? ?Labs: ?Recent Labs  ?  10/03/21 ?0028 10/03/21 ?0256 10/03/21 ?5974 10/03/21 ?1638 10/03/21 ?1200 10/03/21 ?1203 10/03/21 ?2154 10/04/21 ?4536 10/05/21 ?4680  ?HGB 13.3  --   --   --   --   --   --  11.7* 12.0  ?HCT 41.1  --   --   --   --   --   --  36.0 37.0  ?PLT 226  --   --   --   --   --   --  189 195  ?APTT  --   --  31  --   --   --   --   --   --   ?LABPROT  --   --  13.5  --   --   --   --   --   --   ?INR  --   --  1.0  --   --   --   --   --   --   ?HEPARINUNFRC  --   --   --   --    < >  --  0.39 0.38 0.34  ?CREATININE 0.67  --   --   --   --   --   --  0.57  --   ?TROPONINIHS 24* 1,380*  --  5,681*  --  5,560*  --   --   --   ? < > = values in this interval not displayed.  ? ?Heparin Dosing Weight: 57.8 kg ? ?Estimated Creatinine Clearance: 34.1 mL/min (by C-G formula based on SCr of 0.57 mg/dL). ? ? ?Medical History: ?Past Medical History:  ?Diagnosis Date  ? Hypertension   ? ? ?Assessment: ?Pt is 88 yo female w/ h/o HTN, HLD, CAD presenting to ED w/ L-sided CP, found with elevated troponin I, trending up. Pharmacy consulted for mgmt of heparin gtt ISO NSTEMI. ? ?Date Time  HL Rate/Comment ?4/04 1200 0.15 Subtherapeutic; 750>950 un/hr ?4/04 2154 0.39 Therapeutic x1; 950  un/hr ?4/05 0552 0.38 Therapeutic x 2 ?4/06 0508 0.34 Therapeutic x 3  ? ?Baseline Labs: ?aPTT - 31s ?INR - 1.0 ?Hgb - 13.3 ?Plts - 226 ?Trop - 32>1224 ? ?Goal of Therapy:  ?Heparin level 0.3-0.7 units/ml ?Monitor platelets by anticoagulation protocol: Yes ?  ?Plan: Planning heparin gtt x 72 hrs per cards (started 4/04 0400>>___). Continue ASA/Plavix. ?Heparin level is therapeutic x 3. Continue heparin infusion at 950 units/hr.  ?Recheck HL in AM daily while therapeutic ?CBC daily while on heparin ? ?Otelia Sergeant, PharmD, MBA ?10/05/2021 ?6:36 AM ? ? ? ? ? ?

## 2021-10-05 NOTE — Discharge Summary (Signed)
?PROGRESS NOTE ? ? ? ?Alexis Perez  ZOX:[id number] DOB: 03/26/1924 DOA: 10/03/2021 ?PCP: Atha StarksKylstra, Kim, MD  ? ?If patient is discharged today, please refer to yesterday's discharge summary for details. ? ?Brief Narrative:  ? ?Alexis Perez is a 88 y.o. Caucasian female with medical history significant for essential hypertension and known CAD who presented to the emergency room with acute onset of left-sided chest pain.   ?Her peak troponin was more than 6000, EKG had no ST elevation. ?She was diagnosed with non-STEMI, seen by cardiology.  Continued on dual antiplatelet treatment.  Also started on heparin and continue on statin. ?Patient had additional chest pain overnight, [facility].  As result, patient will be transferred when baed is available ? ?Assessment & Plan: ?  ?Principal Problem: ?  NSTEMI (non-ST elevated myocardial infarction) (HCC) ?Active Problems: ?  Pyuria ?  Essential hypertension ?  Coronary artery disease ? ?Assessment and Plan: ?* NSTEMI (non-ST elevated myocardial infarction) (HCC) ?Patient has been treated with IV heparin, aspirin Plavix and statin.  Patient has completed 48 hours of heparin, will discontinue at this point.  [facility] her cardiologist.  We will transfer when bed is available. ? ?Pyuria ?Urine culture had insignificant growth, UTI ruled out.  Antibiotics discontinued. ? ?Coronary artery disease ?Currently non-STEMI, but improving ? ?Essential hypertension ?Continue Toprol, hold off calcium channel blocker for now. ? ? ?Hypokalemia. ?Potassium repleted, level normalized. ? ? ? ? ? ? ?Status is: Inpatient ?Remains inpatient appropriate because: Severity of disease, pending transfer. ?  ? ?I/O last 3 completed shifts: ?In: 492.3 [P.O.:360; I.V.:132.3] ?Out: -  ?Total I/O ?In: 240 [P.O.:240] ?Out: -  ?  ? ? ?Consultants:   ?Cardiology ? ?Procedures: None ? ?Subjective: ?Patient doing well today, no additional chest pain, no shortness of breath but good appetite without nausea vomiting ? ?Objective: ?Vitals:  ? 10/05/21 0010 10/05/21 0437 10/05/21 0800 10/05/21 1226  ?BP: (!) 143/78 (!) 146/71 (!) 152/67 (!) 132/56  ?Pulse: 76 70 68 75  ?Resp: 18 16 20 20   ?Temp: 98.2 ?F (36.8 ?C) 97.8 ?F (36.6 ?C) 97.8 ?F (36.6 ?C) 97.6 ?F (36.4 ?C)  ?TempSrc:      ?SpO2: 96% 97% 96% 100%  ?Weight:      ?Height:      ? ? ?Intake/Output Summary (Last 24 hours) at 10/05/2021 1317 ?Last data filed at 10/05/2021 1027 ?Gross per 24 hour  ?Intake 444.93 ml  ?Output --  ?Net 444.93 ml  ? ?Filed Weights  ? 10/03/21 0027  ?Weight: 59.9 kg  ? ? ?Examination: ? ?General exam: Appears calm and comfortable  ?Respiratory system: Clear to auscultation. Respiratory effort normal. ?Cardiovascular system: S1 & S2 heard, RRR. No JVD, murmurs, rubs, gallops or clicks. No pedal edema. ?Gastrointestinal system: Abdomen is nondistended, soft and nontender. No organomegaly or masses felt. Normal bowel sounds heard. ?Central nervous system: Alert and oriented. No focal neurological deficits. ?Extremities: Symmetric 5 x 5 power. ?Skin: No rashes, lesions or ulcers ?Psychiatry: Judgement and insight appear normal. Mood & affect appropriate.  ? ? ? ?Data Reviewed: I have personally reviewed following labs and imaging studies ? ?CBC: ?Recent Labs  ?Lab 10/03/21 ?0028 10/04/21 ?[id number] 10/05/21 ?[id number]  ?WBC 10.9* 4.9 5.1  ?NEUTROABS 9.6*  --   --   ?HGB 13.3 11.7* 12.0  ?HCT 41.1 36.0 37.0  ?MCV  94.5 92.5 92.0  ?PLT 226 189 195  ? ?Basic Metabolic Panel: ?Recent Labs  ?Lab 10/03/21 ?0028 10/04/21 ?2876 10/05/21 ?8115  ?NA 134* 138  --   ?K 4.0 3.3* 3.6  ?CL 97* 103  --   ?CO2 25 29  --   ?GLUCOSE 157* 140*  --   ?BUN 22 22  --   ?CREATININE 0.67 0.57  --   ?CALCIUM 9.5 8.7*  --   ?MG  --  1.9 2.0  ? ?GFR: ?Estimated Creatinine Clearance: 34.1 mL/min (by C-G formula based on SCr of 0.57  mg/dL). ?Liver Function Tests: ?Recent Labs  ?Lab 10/03/21 ?0028  ?AST 25  ?ALT 20  ?ALKPHOS 83  ?BILITOT 0.9  ?PROT 8.2*  ?ALBUMIN 4.4  ? ?Recent Labs  ?Lab 10/03/21 ?0028  ?LIPASE 27  ? ?No results for input(s): AMMONIA in the last 168 hours. ?Coagulation Profile: ?Recent Labs  ?Lab 10/03/21 ?0433  ?INR 1.0  ? ?Cardiac Enzymes: ?No results for input(s): CKTOTAL, CKMB, CKMBINDEX, TROPONINI in the last 168 hours. ?BNP (last 3 results) ?No results for input(s): PROBNP in the last 8760 hours. ?HbA1C: ?No results for input(s): HGBA1C in the last 72 hours. ?CBG: ?No results for input(s): GLUCAP in the last 168 hours. ?Lipid Profile: ?Recent Labs  ?  10/04/21 ?7262  ?CHOL 159  ?HDL 58  ?LDLCALC 92  ?TRIG 44  ?CHOLHDL 2.7  ? ?Thyroid Function Tests: ?No results for input(s): TSH, T4TOTAL, FREET4, T3FREE, THYROIDAB in the last 72 hours. ?Anemia Panel: ?No results for input(s): VITAMINB12, FOLATE, FERRITIN, TIBC, IRON, RETICCTPCT in the last 72 hours. ?Sepsis Labs: ?Recent Labs  ?Lab 10/03/21 ?0315  ?LATICACIDVEN 1.1  ? ? ?Recent Results (from the past 240 hour(s))  ?Culture, blood (routine x 2)     Status: None (Preliminary result)  ? Collection Time: 10/03/21  3:09 AM  ? Specimen: BLOOD  ?Result Value Ref Range Status  ? Specimen Description BLOOD LEFT ARM  Final  ? Special Requests   Final  ?  BOTTLES DRAWN AEROBIC AND ANAEROBIC Blood Culture adequate volume  ? Culture   Final  ?  NO GROWTH 2 DAYS ?Performed at [facility], [address], [place], [place] [address] ?  ? Report Status PENDING  Incomplete  ?Culture, blood (routine x 2)     Status: None (Preliminary result)  ? Collection Time: 10/03/21  3:11 AM  ? Specimen: BLOOD  ?Result Value Ref Range Status  ? Specimen Description BLOOD RIGHT ARM  Final  ? Special Requests   Final  ?  BOTTLES DRAWN AEROBIC AND ANAEROBIC Blood Culture adequate volume  ? Culture   Final  ?  NO GROWTH 2 DAYS ?Performed at [facility], [address]  [place], [place] [address] ?  ? Report Status PENDING  Incomplete  ?Urine Culture     Status: Abnormal  ? Collection Time: 10/03/21  3:15 AM  ? Specimen: Urine, Clean Catch  ?Result Value Ref Range Status  ? Specimen Description   Final  ?  URINE, CLEAN CATCH ?Performed at [facility], [address], [place], [place] [address] ?  ? Special Requests   Final  ?  NONE ?Performed at [facility], [address] [place], [place] [address] ?  ? Culture (A)  Final  ?  <10,000 COLONIES/mL INSIGNIFICANT GROWTH ?Performed at [facility], [address], [place], [place] [address] ?  ? Report Status 10/04/2021 FINAL  Final  ?  ? ? ? ? ? ?  Radiology Studies: ?ECHOCARDIOGRAM COMPLETE ? ?Result Date: 10/04/2021 ?   ECHOCARDIOGRAM REPORT   Patient Name:   Alexis Perez Date of Exam: 10/04/2021 Medical Rec #:  [id number]     Height:       60.0 in Accession #:    [id number]    Weight:       132.0 lb Date of Birth:  [birth year]      BSA:          1.564 m? Patient Age:    88 years      BP:           138/74 mmHg Patient Gender: F             HR:           74 bpm. Exam Location:  [facility] Procedure: 2D Echo, Color Doppler and Cardiac Doppler Indications:     I21.4 NSTEMI  History:         Patient has no prior history of Echocardiogram examinations.                  Risk Factors:Hypertension.  Sonographer:     Humphrey Rolls Referring Phys:  [id number] JAN A MANSY Diagnosing Phys: Arnoldo Hooker MD  Sonographer Comments: Suboptimal subcostal window. IMPRESSIONS  1. Left ventricular ejection fraction, by estimation, is 60 to 65%. The left ventricle has normal function. The left ventricle has no regional wall motion abnormalities. There is moderate left ventricular hypertrophy. Left ventricular diastolic parameters are consistent with Grade II diastolic dysfunction (pseudonormalization).  2. Right ventricular systolic function is normal. The right ventricular size is normal.  3. The mitral valve is normal in structure. Mild to moderate  mitral valve regurgitation.  4. The aortic valve is calcified. Aortic valve regurgitation is trivial. Mild aortic valve stenosis. FINDINGS  Left Ventricle: Left ventricular ejection fraction, by estimation, is 60 to 65%. The le

## 2021-10-05 NOTE — Discharge Summary (Signed)
?Physician Discharge Summary ?  ?Patient: Alexis Perez MRN: [id number] DOB: 06/19/1924  ?Admit date:     10/03/2021  ?Discharge date: 10/05/21  ?Discharge Physician: Marrion Coyekui Sheralee Qazi  ? ?PCP: Atha StarksKylstra, Kim, MD  ? ?Recommendations at discharge:  ? ? Follow up with PCP and cardiology [facility] ? ?Discharge Diagnoses: ?Principal Problem: ?  NSTEMI (non-ST elevated myocardial infarction) (HCC) ?Active Problems: ?  Pyuria ?  Essential hypertension ?  Coronary artery disease ? ?Resolved Problems: ?  * No resolved hospital problems. * ? ?Hospital Course: ?Alexis Gammann Roadcap is a 88 y.o. Caucasian female with medical history significant for essential hypertension and known CAD who presented to the emergency room with acute onset of left-sided chest pain.   ?Her peak troponin was more than 6000, EKG had no ST elevation. ?She was diagnosed with non-STEMI, seen by cardiology.  Continued on dual antiplatelet treatment.  Also started on heparin and continue on statin. ?Patient had additional chest pain overnight, [facility]  As result, patient will be transferred when baed is available ? ?Assessment and Plan: ?* NSTEMI (non-ST elevated myocardial infarction) (HCC) ?Patient has been treated with IV heparin, aspirin Plavix and statin.  Patient has completed 48 hours of heparin, will discontinue at this point.  [facility] cardiologist.  We will transfer when bed is available. ? ?Pyuria ?Urine culture had insignificant growth, UTI ruled out.  Antibiotics discontinued. ? ?Coronary artery disease ?Currently non-STEMI, but improving ? ?Essential hypertension ?Continue Toprol, hold off calcium channel blocker for now. ? ? ? ? ?  ? ? ?Consultants: cardiology ?Procedures performed: None  ?Disposition:  [facility] ?Diet recommendation:  ?Discharge Diet Orders (From  admission, onward)  ? ?  Start     Ordered  ? 10/04/21 0000  Diet - low sodium heart healthy       ? 10/04/21 1637  ? ?  ?  ? ?  ? ?NPO pending heart cath ?DISCHARGE MEDICATION: ?Allergies as of 10/05/2021   ? ?   Reactions  ? Fentanyl Nausea And Vomiting  ? Sodium Pentobarbital [pentobarbital] Anaphylaxis  ? When pt was sedated for surgery  ? Hydrochlorothiazide Other (See Comments)  ? hyponatremia  ? Ciprofloxacin   ? Blurry vision, sleepy and make pt sick  ? Codeine Other (See Comments)  ? Other reaction(s): UNKNOWN  ? Penicillin G Rash  ? Sulfa Antibiotics Rash  ? ?  ? ?  ?Medication List  ?  ? ?STOP taking these medications   ? ?amLODipine 10 MG tablet ?Commonly known as: NORVASC ?  ?meloxicam 7.5 MG tablet ?Commonly known as: MOBIC ?  ?nitrofurantoin (macrocrystal-monohydrate) 100 MG capsule ?Commonly known as: MACROBID ?  ?olmesartan 40 MG tablet ?Commonly known as: BENICAR ?  ?Polyethyl Glycol-Propyl Glycol 0.4-0.3 % Soln ?  ? ?  ? ?TAKE these medications   ? ?aspirin EC 81 MG tablet ?Take 81 mg by mouth daily. Swallow whole. ?  ?Calcium-Vitamin D-Vitamin K 500-100-40 MG-UNT-MCG Chew ?Chew by mouth. ?  ?Cholecalciferol 50 MCG (2000 UT) Caps ?Take by mouth. ?  ?clopidogrel 75 MG tablet ?Commonly known as: PLAVIX ?Take by mouth. ?  ?diclofenac Sodium 1 % Gel ?Commonly known as: VOLTAREN ?Apply topically. ?  ?ezetimibe 10 MG tablet ?Commonly known as: ZETIA ?Take by mouth. ?  ?isosorbide mononitrate 30 MG 24 hr tablet ?Commonly  known as: IMDUR ?Take by mouth. ?  ?metoprolol succinate 50 MG 24 hr tablet ?Commonly known as: TOPROL-XL ?Take by mouth. ?  ?nitroGLYCERIN 0.4 MG SL tablet ?Commonly known as: NITROSTAT ?Place under the tongue. ?  ?PreserVision AREDS 2 Caps ?daily. ?  ?rosuvastatin 10 MG tablet ?Commonly known as: CRESTOR ?Take 1 tablet (10 mg total) by mouth at bedtime. ?  ? ?  ? ? ?Discharge Exam: ?Filed Weights  ? 10/03/21 0027  ?Weight: 59.9 kg  ? ?General exam: Appears calm and comfortable   ?Respiratory system: Clear to auscultation. Respiratory effort normal. ?Cardiovascular system: S1 & S2 heard, RRR. No JVD, murmurs, rubs, gallops or clicks. No pedal edema. ?Gastrointestinal system: Abdomen is nondistended, soft and nontender. No organomegaly or masses felt. Normal bowel sounds heard. ?Central nervous system: Alert and oriented. No focal neurological deficits. ?Extremities: Symmetric 5 x 5 power. ?Skin: No rashes, lesions or ulcers ?Psychiatry: Judgement and insight appear normal. Mood & affect appropriate.  ? ? ?Condition at discharge: good ? ?The results of significant diagnostics from this hospitalization (including imaging, microbiology, ancillary and laboratory) are listed below for reference.  ? ?Imaging Studies: ?DG Chest 1 View ? ?Result Date: 10/03/2021 ?CLINICAL DATA:  Chest pain EXAM: CHEST  1 VIEW COMPARISON:  None. FINDINGS: Heart size and pulmonary vascularity are normal. Probable emphysematous changes in the upper lungs. Diffuse interstitial pattern to the lungs, mostly in the periphery. This likely indicates pulmonary fibrosis. No airspace disease or consolidation. No pleural effusions. No pneumothorax. Calcification of the aorta. Degenerative changes in the spine. Thoracolumbar scoliosis convex towards the left. IMPRESSION: Diffuse interstitial pattern to the lungs likely representing chronic interstitial lung disease. Electronically Signed   By: Burman Nieves M.D.   On: 10/03/2021 00:59  ? ?ECHOCARDIOGRAM COMPLETE ? ?Result Date: 10/04/2021 ?   ECHOCARDIOGRAM REPORT   Patient Name:   Alexis Perez Date of Exam: 10/04/2021 Medical Rec #:  [id number]     Height:       60.0 in Accession #:    [id number]    Weight:       132.0 lb Date of Birth:  1924/02/01      BSA:          1.564 m? Patient Age:    88 years      BP:           138/74 mmHg Patient Gender: F             HR:           74 bpm. Exam Location:  [facility] Procedure: 2D Echo, Color Doppler and Cardiac Doppler Indications:     I21.4  NSTEMI  History:         Patient has no prior history of Echocardiogram examinations.                  Risk Factors:Hypertension.  Sonographer:     Humphrey Rolls Referring Phys:  [id number] JAN A MANSY Diagnosing Phys: Arnoldo Hooker MD  Sonographer Comments: Suboptimal subcostal window. IMPRESSIONS  1. Left ventricular ejection fraction, by estimation, is 60 to 65%. The left ventricle has normal function. The left ventricle has no regional wall motion abnormalities. There is moderate left ventricular hypertrophy. Left ventricular diastolic parameters are consistent with Grade II diastolic dysfunction (pseudonormalization).  2. Right ventricular systolic function is normal. The right ventricular size is normal.  3. The mitral valve is normal in structure. Mild to moderate mitral valve regurgitation.  4. The aortic valve  is calcified. Aortic valve regurgitation is trivial. Mild aortic valve stenosis. FINDINGS  Left Ventricle: Left ventricular ejection fraction, by estimation, is 60 to 65%. The left ventricle has normal function. The left ventricle has no regional wall motion abnormalities. The left ventricular internal cavity size was normal in size. There is  moderate left ventricular hypertrophy. Left ventricular diastolic parameters are consistent with Grade II diastolic dysfunction (pseudonormalization). Right Ventricle: The right ventricular size is normal. No increase in right ventricular wall thickness. Right ventricular systolic function is normal. Left Atrium: Left atrial size was normal in size. Right Atrium: Right atrial size was normal in size. Pericardium: There is no evidence of pericardial effusion. Mitral Valve: The mitral valve is normal in structure. Mild to moderate mitral valve regurgitation. MV peak gradient, 10.5 mmHg. The mean mitral valve gradient is 5.0 mmHg. Tricuspid Valve: The tricuspid valve is normal in structure. Tricuspid valve regurgitation is mild. Aortic Valve: The aortic valve is  calcified. Aortic valve regurgitation is trivial. Mild aortic stenosis is present. Aortic valve mean gradient measures 11.0 mmHg. Aortic valve peak gradient measures 19.1 mmHg. Aortic valve area, by VTI measures 1.45 cm?. Pulmoni

## 2021-10-08 LAB — CULTURE, BLOOD (ROUTINE X 2)
Culture: NO GROWTH
Culture: NO GROWTH
Special Requests: ADEQUATE
Special Requests: ADEQUATE

## 2021-10-11 ENCOUNTER — Other Ambulatory Visit: Payer: Self-pay

## 2021-10-11 ENCOUNTER — Emergency Department: Payer: Medicare PPO

## 2021-10-11 ENCOUNTER — Emergency Department
Admission: EM | Admit: 2021-10-11 | Discharge: 2021-10-11 | Disposition: A | Discharge status: Other Acute Inpatient Hospital | Payer: Medicare PPO | Attending: Emergency Medicine | Admitting: Emergency Medicine

## 2021-10-11 DIAGNOSIS — R079 Chest pain, unspecified: Secondary | ICD-10-CM | POA: Diagnosis present

## 2021-10-11 DIAGNOSIS — I200 Unstable angina: Secondary | ICD-10-CM

## 2021-10-11 DIAGNOSIS — Z79899 Other long term (current) drug therapy: Secondary | ICD-10-CM | POA: Insufficient documentation

## 2021-10-11 DIAGNOSIS — I25110 Atherosclerotic heart disease of native coronary artery with unstable angina pectoris: Secondary | ICD-10-CM | POA: Diagnosis not present

## 2021-10-11 DIAGNOSIS — I10 Essential (primary) hypertension: Secondary | ICD-10-CM | POA: Insufficient documentation

## 2021-10-11 DIAGNOSIS — Z7982 Long term (current) use of aspirin: Secondary | ICD-10-CM | POA: Insufficient documentation

## 2021-10-11 LAB — COMPREHENSIVE METABOLIC PANEL
ALT: 19 U/L (ref 0–44)
AST: 24 U/L (ref 15–41)
Albumin: 2.9 g/dL — ABNORMAL LOW (ref 3.5–5.0)
Alkaline Phosphatase: 50 U/L (ref 38–126)
Anion gap: 6 (ref 5–15)
BUN: 25 mg/dL — ABNORMAL HIGH (ref 8–23)
CO2: 24 mmol/L (ref 22–32)
Calcium: 8 mg/dL — ABNORMAL LOW (ref 8.9–10.3)
Chloride: 105 mmol/L (ref 98–111)
Creatinine, Ser: 0.55 mg/dL (ref 0.44–1.00)
GFR, Estimated: >60 mL/min (ref >60)
Glucose, Bld: 107 mg/dL — ABNORMAL HIGH (ref 70–99)
Potassium: 4.1 mmol/L (ref 3.5–5.1)
Sodium: 135 mmol/L (ref 135–145)
Total Bilirubin: 0.7 mg/dL (ref 0.3–1.2)
Total Protein: 5.4 g/dL — ABNORMAL LOW (ref 6.5–8.1)

## 2021-10-11 LAB — CBC WITH DIFFERENTIAL/PLATELET
Abs Immature Granulocytes: 0.03 10*3/uL (ref 0.00–0.07)
Basophils Absolute: 0.1 10*3/uL (ref 0.0–0.1)
Basophils Relative: 1 %
Eosinophils Absolute: 0.8 10*3/uL — ABNORMAL HIGH (ref 0.0–0.5)
Eosinophils Relative: 13 %
HCT: 35 % — ABNORMAL LOW (ref 36.0–46.0)
Hemoglobin: 10.9 g/dL — ABNORMAL LOW (ref 12.0–15.0)
Immature Granulocytes: 1 %
Lymphocytes Relative: 30 %
Lymphs Abs: 1.9 10*3/uL (ref 0.7–4.0)
MCH: 29.6 pg (ref 26.0–34.0)
MCHC: 31.1 g/dL (ref 30.0–36.0)
MCV: 95.1 fL (ref 80.0–100.0)
Monocytes Absolute: 0.6 10*3/uL (ref 0.1–1.0)
Monocytes Relative: 10 %
Neutro Abs: 2.9 10*3/uL (ref 1.7–7.7)
Neutrophils Relative %: 45 %
Platelets: 218 10*3/uL (ref 150–400)
RBC: 3.68 MIL/uL — ABNORMAL LOW (ref 3.87–5.11)
RDW: 13.5 % (ref 11.5–15.5)
WBC: 6.3 10*3/uL (ref 4.0–10.5)
nRBC: 0 % (ref 0.0–0.2)

## 2021-10-11 LAB — TROPONIN I (HIGH SENSITIVITY)
Troponin I (High Sensitivity): 41 ng/L — ABNORMAL HIGH (ref <18)
Troponin I (High Sensitivity): 54 ng/L — ABNORMAL HIGH (ref <18)
Troponin I (High Sensitivity): 45 ng/L — ABNORMAL HIGH (ref <18)

## 2021-10-11 LAB — BRAIN NATRIURETIC PEPTIDE: B Natriuretic Peptide: 72 pg/mL (ref 0.0–100.0)

## 2021-10-11 MED ORDER — OCUVITE-LUTEIN PO CAPS
1.0000 | ORAL_CAPSULE | Freq: Every day | ORAL | Status: DC
  Administered 2021-10-11: 1 via ORAL
  Filled 2021-10-11: qty 1

## 2021-10-11 MED ORDER — CLOPIDOGREL BISULFATE 75 MG PO TABS
75.0000 mg | ORAL_TABLET | Freq: Every day | ORAL | Status: DC
  Administered 2021-10-11: 75 mg via ORAL
  Filled 2021-10-11: qty 1

## 2021-10-11 MED ORDER — OYSTER SHELL CALCIUM/D3 500-5 MG-MCG PO TABS
1.0000 | ORAL_TABLET | Freq: Every day | ORAL | Status: DC
  Administered 2021-10-11: 1 via ORAL
  Filled 2021-10-11: qty 1

## 2021-10-11 MED ORDER — DICLOFENAC SODIUM 1 % EX GEL: 2.0000 g | Freq: Four times a day (QID) | CUTANEOUS | Status: DC

## 2021-10-11 MED ORDER — VITAMIN D 25 MCG (1000 UNIT) PO TABS
2000.0000 [IU] | ORAL_TABLET | Freq: Every day | ORAL | Status: DC
  Administered 2021-10-11: 2000 [IU] via ORAL
  Filled 2021-10-11: qty 2

## 2021-10-11 MED ORDER — EZETIMIBE 10 MG PO TABS
10.0000 mg | ORAL_TABLET | Freq: Every day | ORAL | Status: DC
  Administered 2021-10-11: 10 mg via ORAL
  Filled 2021-10-11: qty 1

## 2021-10-11 MED ORDER — ASPIRIN EC 81 MG PO TBEC
81.0000 mg | DELAYED_RELEASE_TABLET | Freq: Every day | ORAL | Status: DC
  Administered 2021-10-11: 81 mg via ORAL
  Filled 2021-10-11: qty 1

## 2021-10-11 MED ORDER — ISOSORBIDE MONONITRATE ER 60 MG PO TB24
120.0000 mg | ORAL_TABLET | Freq: Every day | ORAL | Status: DC
  Administered 2021-10-11: 120 mg via ORAL
  Filled 2021-10-11: qty 2

## 2021-10-11 MED ORDER — METOPROLOL SUCCINATE ER 50 MG PO TB24
50.0000 mg | ORAL_TABLET | Freq: Every day | ORAL | Status: DC
  Administered 2021-10-11: 50 mg via ORAL
  Filled 2021-10-11: qty 1

## 2021-10-11 NOTE — ED Triage Notes (Signed)
Arrived via EMS with complaints of chest pain. Pt has had 324 mg asa and 2 nitrostat prior to EMS arrival.  EMS gave patient one nitro spray. Was able use walker to EMS stretcher. ?

## 2021-10-11 NOTE — ED Provider Notes (Signed)
? ?[facility] ?Provider Note ? ? ? Event Date/Time  ? First MD Initiated Contact with Patient 10/11/21 0206   ?  (approximate) ? ? ?History  ? ?Chest Pain ? ? ?HPI ? ?Alexis Perez is a 88 y.o. female brought to the ED via EMS from home with a chief complaint of chest pain.  Patient with a history of hypertension, recent admission for non-STEMI who got up to use the restroom and experienced central chest discomfort.  Received 4 baby aspirin and nitroglycerin by EMS with complete relief of symptoms.  Currently denies chest pain, fever, cough, shortness of breath, abdominal pain, nausea or vomiting.  Reports BLE swelling at baseline. ?  ? ? ?Past Medical History  ? ?Past Medical History:  ?Diagnosis Date  ? Hypertension   ? ? ? ?Active Problem List  ? ?Patient Active Problem List  ? Diagnosis Date Noted  ? NSTEMI (non-ST elevated myocardial infarction) ([place]) 10/03/2021  ? Essential hypertension 10/03/2021  ? Coronary artery disease 10/03/2021  ? Pyuria 10/03/2021  ? ? ? ?Past Surgical History  ? ?Past Surgical History:  ?Procedure Laterality Date  ? ANKLE FRACTURE SURGERY Left 1999  ? ? ? ?Home Medications  ? ?Prior to Admission medications   ?Medication Sig Start Date End Date Taking? Authorizing Provider  ?aspirin EC 81 MG tablet Take 81 mg by mouth daily. Swallow whole.   Yes [provider]  ?Calcium-Vitamin D-Vitamin K 500-100-40 MG-UNT-MCG CHEW Chew 1 tablet by mouth daily.   Yes [provider]  ?Cholecalciferol 50 MCG (2000 UT) CAPS Take 1 capsule by mouth daily.   Yes [provider]  ?clopidogrel (PLAVIX) 75 MG tablet Take 75 mg by mouth daily. 07/22/21  Yes [provider]  ?diclofenac Sodium (VOLTAREN) 1 % GEL Apply 2 g topically 4 (four) times daily. 04/17/21 04/17/22 Yes [provider]  ?ezetimibe (ZETIA) 10 MG tablet Take 10 mg by mouth daily. 07/22/21  Yes [provider]  ?isosorbide mononitrate (IMDUR) 30 MG 24 hr tablet Take  120 mg by mouth daily. 08/20/21  Yes [provider]  ?metoprolol succinate (TOPROL-XL) 50 MG 24 hr tablet Take 50 mg by mouth daily. 08/09/21  Yes [provider]  ?Multiple Vitamins-Minerals (PRESERVISION AREDS 2) CAPS Take 1 capsule by mouth daily. 03/19/19  Yes [provider]  ?nitroGLYCERIN (NITROSTAT) 0.4 MG SL tablet Place under the tongue. 07/07/21  Yes [provider]  ?rosuvastatin (CRESTOR) 10 MG tablet Take 1 tablet (10 mg total) by mouth at bedtime. ?Patient not taking: Reported on 10/11/2021 10/04/21   Sharen Hones, MD  ? ? ? ?Allergies  ?Fentanyl, Sodium pentobarbital [pentobarbital], Hydrochlorothiazide, Ciprofloxacin, Codeine, Penicillin g, and Sulfa antibiotics ? ? ?Family History  ?No family history on file. ? ? ?Physical Exam  ?Triage Vital Signs: ?ED Triage Vitals  ?Enc Vitals Group  ?   BP   ?   Pulse   ?   Resp   ?   Temp   ?   Temp src   ?   SpO2   ?   Weight   ?   Height   ?   Head Circumference   ?   Peak Flow   ?   Pain Score   ?   Pain Loc   ?   Pain Edu?   ?   Excl. in [place]?   ? ? ?Updated Vital Signs: ?BP (!) 135/59   Pulse 73   Temp 98.9 ?F (  37.2 ?C)   Resp 18   Ht 5' (1.524 m)   Wt 59.9 kg   SpO2 98%   BMI 25.78 kg/m?  ? ? ?General: Awake, no distress.  ?CV:  RRR.  Good peripheral perfusion.  ?Resp:  Normal effort.  CTA B. ?Abd:  No distention.  ?Other:   ? ? ?ED Results / Procedures / Treatments  ?Labs ?(all labs ordered are listed, but only abnormal results are displayed) ?Labs Reviewed  ?CBC WITH DIFFERENTIAL/PLATELET - Abnormal; Notable for the following components:  ?    Result Value  ? RBC 3.68 (*)   ? Hemoglobin 10.9 (*)   ? HCT 35.0 (*)   ? Eosinophils Absolute 0.8 (*)   ? All other components within normal limits  ?COMPREHENSIVE METABOLIC PANEL - Abnormal; Notable for the following components:  ? Glucose, Bld 107 (*)   ? BUN 25 (*)   ? Calcium 8.0 (*)   ? Total Protein 5.4 (*)   ? Albumin 2.9 (*)   ? All other components within normal limits   ?TROPONIN I (HIGH SENSITIVITY) - Abnormal; Notable for the following components:  ? Troponin I (High Sensitivity) 41 (*)   ? All other components within normal limits  ?TROPONIN I (HIGH SENSITIVITY) - Abnormal; Notable for the following components:  ? Troponin I (High Sensitivity) 54 (*)   ? All other components within normal limits  ?BRAIN NATRIURETIC PEPTIDE  ? ? ? ?EKG ? ?ED ECG REPORT ?I, Paulette Blanch, the attending physician, personally viewed and interpreted this ECG. ? ? Date: 10/11/2021 ? EKG Time: 0220 ? Rate: 82 ? Rhythm: normal sinus rhythm ? Axis: Normal ? Intervals:none ? ST&T Change: Nonspecific ?No significant change from 10/04/2021 ? ? ?RADIOLOGY ?I have independently visualized and interpreted patient's chest x-ray as well as noted the radiology interpretation: ? ?Chest x-ray: Changes of mild CHF ? ?Official radiology report(s): ?DG Chest Port 1 View ? ?Result Date: 10/11/2021 ?CLINICAL DATA:  Chest pain EXAM: PORTABLE CHEST 1 VIEW COMPARISON:  10/03/2021 FINDINGS: Cardiac shadow is enlarged. Aortic calcifications are again seen. Mild vascular congestion is noted with interstitial edema new from the prior study. No focal infiltrate is seen. IMPRESSION: Changes of mild CHF. Electronically Signed   By: Inez Catalina M.D.   On: 10/11/2021 02:34   ? ? ?PROCEDURES: ? ?Critical Care performed: Yes, see critical care procedure note(s) ? ?CRITICAL CARE ?Performed by: Paulette Blanch ? ? ?Total critical care time: 45 minutes ? ?Critical care time was exclusive of separately billable procedures and treating other patients. ? ?Critical care was necessary to treat or prevent imminent or life-threatening deterioration. ? ?Critical care was time spent personally by me on the following activities: development of treatment plan with patient and/or surrogate as well as nursing, discussions with consultants, evaluation of patient's response to treatment, examination of patient, obtaining history from patient or surrogate,  ordering and performing treatments and interventions, ordering and review of laboratory studies, ordering and review of radiographic studies, pulse oximetry and re-evaluation of patient's condition. ? ? ?.1-3 Lead EKG Interpretation ?Performed by: Paulette Blanch, MD ?Authorized by: Paulette Blanch, MD  ? ?Comments:  ?   Patient placed on cardiac monitor to evaluate for arrhythmias ? ? ?MEDICATIONS ORDERED IN ED: ?Medications  ?aspirin EC tablet 81 mg (has no administration in time range)  ?calcium-vitamin D (OSCAL WITH D) 500-5 MG-MCG per tablet 1 tablet (has no administration in time range)  ?cholecalciferol (VITAMIN D3) tablet 2,000 Units (has  no administration in time range)  ?clopidogrel (PLAVIX) tablet 75 mg (has no administration in time range)  ?diclofenac Sodium (VOLTAREN) 1 % topical gel 2 g (has no administration in time range)  ?ezetimibe (ZETIA) tablet 10 mg (has no administration in time range)  ?isosorbide mononitrate (IMDUR) 24 hr tablet 120 mg (has no administration in time range)  ?metoprolol succinate (TOPROL-XL) 24 hr tablet 50 mg (has no administration in time range)  ?multivitamin-lutein (OCUVITE-LUTEIN) capsule 1 capsule (has no administration in time range)  ? ? ? ?IMPRESSION / MDM / ASSESSMENT AND PLAN / ED COURSE  ?I reviewed the triage vital signs and the nursing notes. ?             ?               ?88 year old female presenting with chest pain. Differential diagnosis includes, but is not limited to, ACS, aortic dissection, pulmonary embolism, cardiac tamponade, pneumothorax, pneumonia, pericarditis, myocarditis, GI-related causes including esophagitis/gastritis, and musculoskeletal chest wall pain.   I have personally reviewed patient's records and see that she was admitted to our facility 10/03/2021 with non-STEMI with peak troponin greater than 6000.  Her [facility] cardiologist called and asked for patient to be transferred to [facility] for heart catheterization.  She was transferred there on 4/6 and  discharged 10/07/2021.  Recent LHC 07/24/2021 notable for severe three-vessel calcified coronary artery disease including 100% RCA, 80% diffuse proximal/mid LAD, 90% proximal LCx disease.  The plan was to have her ca

## 2021-10-11 NOTE — ED Notes (Signed)
Pt bed assignment @ [facility] unit 3 west room 3233 bed 2 [id number] ?

## 2021-10-12 ENCOUNTER — Ambulatory Visit (HOSPITAL_COMMUNITY)
Admission: AD | Admit: 2021-10-12 | Discharge: 2021-10-12 | Disposition: A | Discharge status: Home / Self Care | Payer: Medicare PPO | Source: Other Acute Inpatient Hospital | Attending: Emergency Medicine | Admitting: Emergency Medicine

## 2021-10-12 DIAGNOSIS — I213 ST elevation (STEMI) myocardial infarction of unspecified site: Secondary | ICD-10-CM | POA: Diagnosis present

## 2021-10-16 ENCOUNTER — Other Ambulatory Visit: Payer: Self-pay | Admitting: *Deleted

## 2021-10-16 DIAGNOSIS — I214 Non-ST elevation (NSTEMI) myocardial infarction: Secondary | ICD-10-CM

## 2022-10-23 IMAGING — DX DG CHEST 1V PORT
1 series · 1 of 1 positions shown · non-contrast
Comparison: 10/03/2021

CLINICAL DATA: Chest pain

EXAM:
PORTABLE CHEST 1 VIEW

[chest ap]
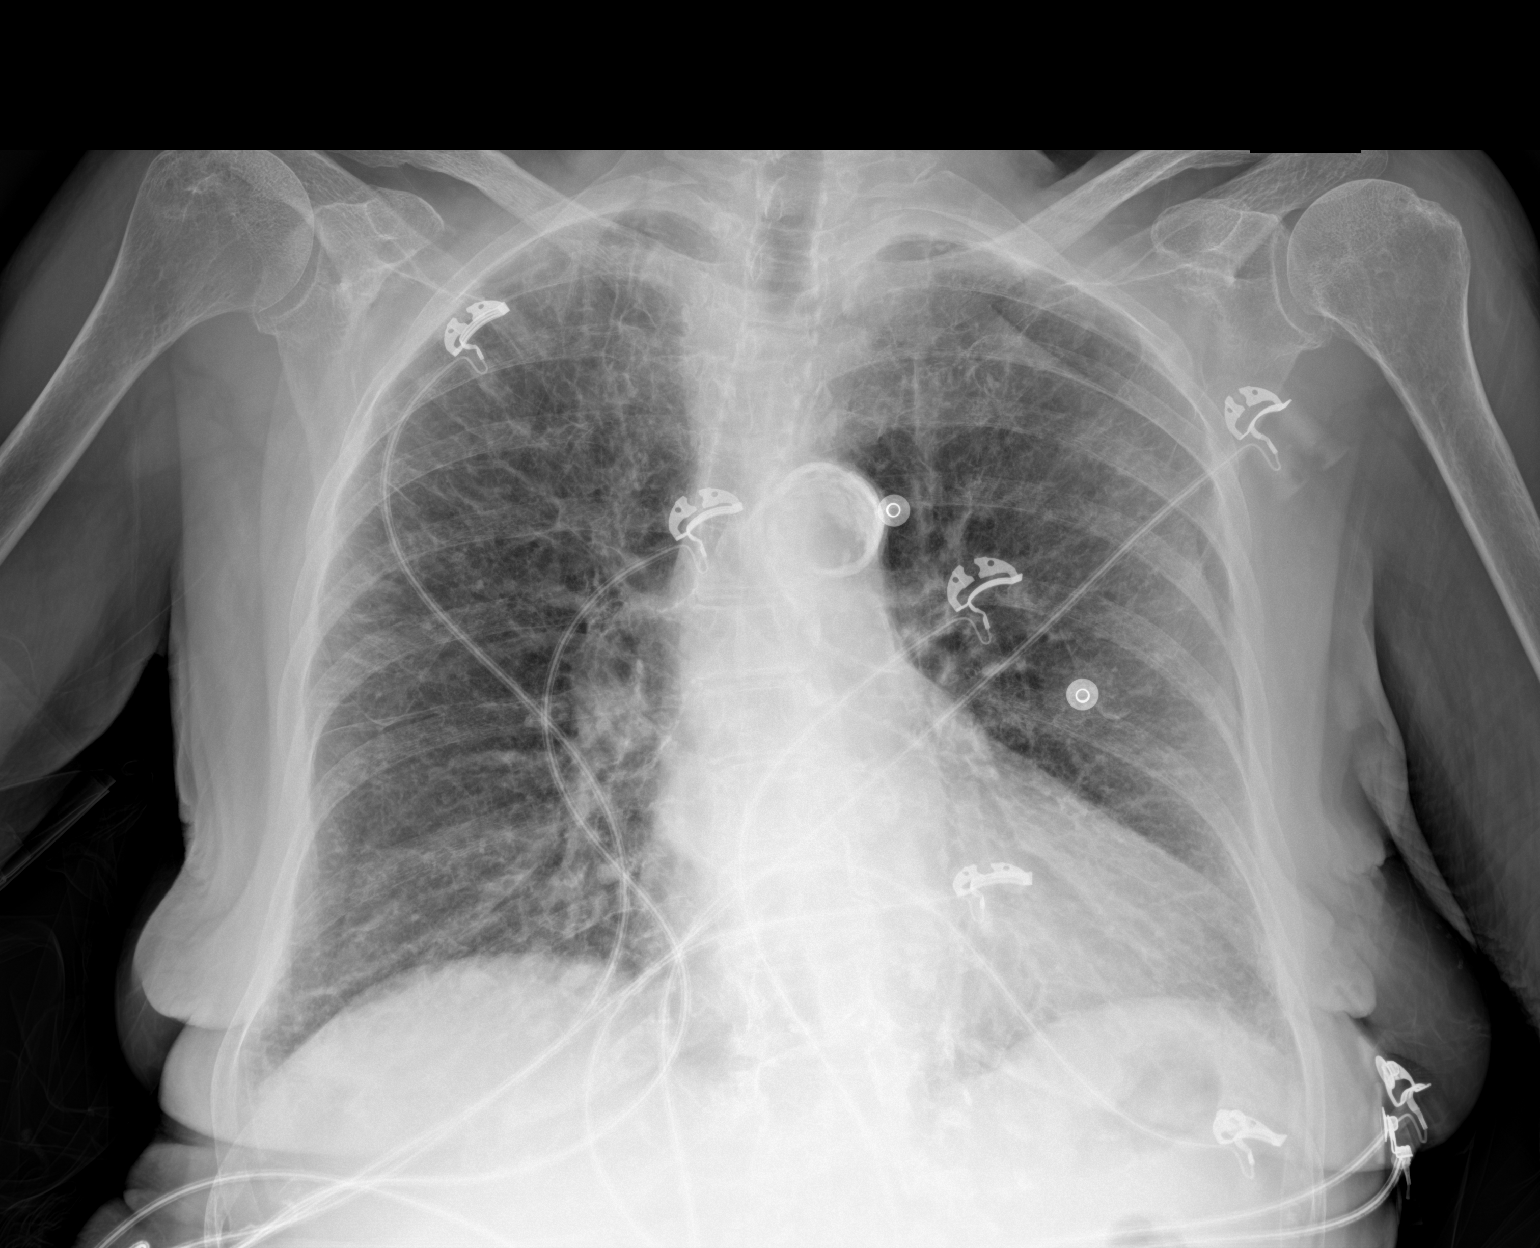

[1 of 1 positions shown; findings below may reference images not displayed]

FINDINGS: Cardiac shadow is enlarged. Aortic calcifications are again seen.
Mild vascular congestion is noted with interstitial edema new from
the prior study. No focal infiltrate is seen.
IMPRESSION: Changes of mild CHF.

## 2023-02-18 NOTE — Progress Notes (Signed)
 DIVISION OF CARDIOLOGY [facility] , [place]       Date of Service: 02/18/2023    PCP: Referring Provider:  Warren Iha, MD [address] [place] [address] Phone: [phone] Fax: [phone] Warren Iha, MD [address] [place],  [place] [address] Phone: [phone] Fax: [phone]   Assessment and Plan:  Ms. Lonardo is a 89 y.o. female with multivessel CAD, Aortic sclerosis, osteoarthritis, HTN, HLD.  The patient is seen today for follow-up.  Multivessel CAD: First diagnosed multivessel CAD after NSTEMI in December 2022.  We attempted to medically manage diffuse coronary disease with antianginal titration however unfortunately her symptoms were refractory.  Ultimately we made the decision to proceed with complex PCI of the left circumflex territory with Dr. Eloy.  This occurred on 10/19/2021.  She had rotational atherectomy and stent placement and since that time she reports that she has been feeling extremely well with no recurrent angina at this time.  She continues to have some residual LAD disease that we are medically managing.  Plavix  genotyping was normal -- Continue metoprolol  succinate and increase to 100mg  nightly due to elevated BP readings -- Continues amlodipine  5 mg daily.  She does have ongoing lower extremity edema which is likely a function of amlodipine  but reports this is better than prior and not bothersome to her  -- Continues Imdur  120 mg daily -- Given > 12 months since PCI, discontinue plavix  today and continue single agent aspirin  81mg  daily -- Continue Zetia  10mg  daily -- Continue crestor  10mg  once weekly -- Most recent Lipid near goal.  Given intolerance to higher doses of statins we will continue currently therapy -- She has SL nitro at home  HTN: Blood pressure has been elevated at home.   -- increase metoprolol  to 100mg  nightly -- amlodipine  as above -- Imdur  120mg  -- continue to monitor BP at home --  previously discontinued benicar given anti-anginal is priority and no strong indication for ACE/ARB, if BP continues to be problematic may be necessary to revisit  LE Edema: suspect due to amlodipine .  This has improved (but not resolved) reducing dosage from 10mg  to 5mg  daily. Currently ok with current level of swelling  HLD: -- On zetia , retrial lower dose rosuvastatin  as above  Return in about 1 year (around 02/18/2024) for Recheck.  Subjective:     Reason for Consultation: Follow-up  History of Present Illness: Ms. Van is a 89 y.o. female with multivessel CAD, Aortic sclerosis, osteoarthritis, HTN, HLD.  The patient is seen today for follow-up.  I first met her when she was admitted to [facility] with exertional chest discomfort on 06/23/2021.  She was diagnosed with very mild NSTEMI we made decision to medically manage.  Ultimately she had continued anginal symptoms and we ultimately decided to first perform diagnostic left heart catheterization and after experiencing ongoing angina despite medication titration she subsequently had PCI subsequent PCI with rotational atherectomy of the left circumflex in 09/2021.  Here for regular follow up today  Overall doing well, denies major issues.  BP is elevated today in clinic.    Has been following BP at home, ranges from SBP 140-180.  Has been taking 2 metoprolol  tablets (100mg  total) if SBP >160.  In mornings tends to run lower 110s-120s in AM.  Denies overt angina, is able to walk around neighborhood, stores, church without issues.  Mild LE persists but no dyspnea.  Denies palpitations.  Had vagal sounding episode of syncope earier  this year.  Seen in ER and workup reassuring including short-term holter (but did have frequent PACs)  Metoprolol  75mg  nightly (adjusted as above) Amlodipine  5mg  Imdur  120mg  daily ASA Plavix  Zetia  10mg  Crestor  10mg  on Sundays    Cardiovascular History: CAD with recent PCI Aortic  sclerosis Hypertension Hyperlipidemia  I have independently reviewed of all the diagnostic studies. I have reviewed the old medical records from [facility] prior to this clinic visit.   Recent Cardiovascular Studies Date Comments   ECG 02/18/2023  06/25/21 Since rhythm, PACs, 1st degree AVB   sinus rhythm with first-degree AV block, NSST most prominent lateral  Echo 10/06/2021  06/23/2021 EF 65 to 70%, normal RV  EF greater than 55%, mild MR, aortic sclerosis, normal RV  Stress test    Cardiac catheterization 10/19/2021    07/24/2021 PCI to left circumflex with rotational atherectomy 2.5 x 28 Synergy stent  three-vessel CAD.  100% RCA, 80% proximal/mid LAD, 90% proximal lower circumflex.  No good targets for intervention.  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  Past medical history: Patient Active Problem List  Diagnosis  . Benign neoplasm of colon  . Hypertension, benign  . Osteoarthrosis, generalized, involving multiple sites  . Uterine prolapse  . Age-related osteoporosis without current pathological fracture  . History of colon cancer  . Aortic stenosis, mild  . ARMD (age related macular degeneration)  . Hearing impairment  . Hyperlipidemia  . Coronary artery disease  . History of non-ST elevation myocardial infarction (NSTEMI)  . Syncope due to orthostatic hypotension    Medications:  Outpatient Medications Prior to Visit  Medication Sig Dispense Refill  . amLODIPine  (NORVASC ) 5 MG tablet Take 1 tablet (5 mg total) by mouth daily. 90 tablet 3  . aspirin  81 MG chewable tablet Chew 1 tablet (81 mg total) daily. 36 tablet 11  . cholecalciferol , vitamin D3-50 mcg, 2,000 unit,, 50 mcg (2,000 unit) cap Take 1 capsule (50 mcg total) by mouth daily.    . ezetimibe  (ZETIA ) 10 mg tablet Take 1 tablet (10 mg total) by  mouth nightly. 30 tablet 11  . isosorbide  mononitrate (IMDUR ) 30 MG 24 hr tablet TAKE 4 TABLETS (120 MG TOTAL) BY MOUTH DAILY. 360 tablet 3  . peg 400-propylene glycol 0.4-0.3 % Drop Administer 1 drop to both eyes as needed.    . vit C,E-Zn-coppr-lutein -zeaxan (PRESERVISION AREDS-2) 250-200-40-1 mg-unit-mg-mg cap One daily.  0  . clopidogreL  (PLAVIX ) 75 mg tablet Take 1 tablet (75 mg total) by mouth daily. 30 tablet 11  . metoPROLOL  succinate (TOPROL -XL) 50 MG 24 hr tablet Take 1.5 tablets (75 mg total) by mouth nightly. 135 tablet 2  . rosuvastatin  (CRESTOR ) 10 MG tablet Take 0.5 tablets (5 mg total) by mouth Two (2) times a week.     No facility-administered medications prior to visit.    Allergies: Allergies  Allergen Reactions  . Hctz [Hydrochlorothiazide] Other (See Comments)    hyponatremia  . Ciprofloxacin      Blurry vision, sleepy and make pt sick  . Codeine     Other reaction(s): UNKNOWN  . Thiopental     Other reaction(s): ANAPHYLAXIS  . Fentanyl Nausea And Vomiting  . Penicillin G Rash  . Sulfa (Sulfonamide Antibiotics) Rash    Social History: She  reports that she quit smoking about 64 years ago. Her smoking use  included cigarettes. She started smoking about 79 years ago. She has a 15 pack-year smoking history. She has never used smokeless tobacco. She reports that she does not drink alcohol  and does not use drugs.  Family History: Her family history includes Alcohol  abuse in her daughter; Cancer in her daughter and daughter; Cirrhosis in her father; Colon cancer in her daughter; Depression in her daughter and daughter; Diabetes in her daughter; Macular degeneration in her sister; Mental illness in her daughter; No Known Problems in her daughter, daughter, sister, and son; Stroke in her daughter and mother.  Review of Systems 10 systems were reviewed and negative except as noted in HPI.    Objective:     Physical Exam BP 163/78 (BP Site: L Arm, BP Position: Sitting,  BP Cuff Size: Large)   Pulse 83   Ht 152.4 cm (5')   Wt 57.7 kg (127 lb 3.2 oz)   SpO2 94%   BMI 24.84 kg/m  Gen: Well appearing, NAD CV: RRR, II/VI SEM Pulm: Normal effort Ext: Warm, 1+ edema L>R Psych: nl mood and affect    Most recent labs  Lab Results  Component Value Date   Sodium 137 10/12/2022   Sodium 138 02/16/2014   Potassium 3.9 10/12/2022   Potassium 3.8 02/16/2014   Chloride 101 10/12/2022   Chloride 94 (L) 01/17/2014   CO2 29.0 10/12/2022   CO2 27 01/17/2014   BUN 16 10/12/2022   BUN 10 01/17/2014   Creatinine 0.51 (L) 10/12/2022   Creatinine 0.59 (L) 02/16/2014   Magnesium  2.1 10/12/2022   Lab Results  Component Value Date   HGB 13.0 08/23/2022   HGB 12.4 02/16/2014   MCV 92.9 08/23/2022   MCV 92 02/16/2014   Platelet 205 08/23/2022   Platelet 243 02/16/2014   Lab Results  Component Value Date   Cholesterol 145 10/12/2022   Cholesterol, Total 258 (H) 02/16/2014   Triglycerides 70 10/12/2022   Triglycerides 85 12/21/2011   HDL 55 10/12/2022   HDL 65 (H) 02/16/2014   Non-HDL Cholesterol 90 10/12/2022   LDL Calculated 76 10/12/2022   LDL Direct 78.0 10/12/2022   LDL Cholesterol, Calculated 150 12/21/2011   Hemoglobin A1C 5.3 10/05/2021   TSH 1.861 08/23/2022   TSH 1.26 02/16/2014   INR 0.98 07/13/2021       *Patient note was created using dragon dictation. Any errors in syntax or proofreading may not have been identified and edited on initial review prior to signing this note.

## 2023-12-17 ENCOUNTER — Emergency Department

## 2023-12-17 ENCOUNTER — Inpatient Hospital Stay
Admission: EM | Admit: 2023-12-17 | Discharge: 2023-12-21 | DRG: 871 | Disposition: A | Discharge status: Home Health | Attending: Internal Medicine | Admitting: Internal Medicine

## 2023-12-17 ENCOUNTER — Encounter: Payer: Self-pay | Admitting: Emergency Medicine

## 2023-12-17 ENCOUNTER — Other Ambulatory Visit: Payer: Self-pay

## 2023-12-17 DIAGNOSIS — A4151 Sepsis due to Escherichia coli [E. coli]: Secondary | ICD-10-CM | POA: Diagnosis present

## 2023-12-17 DIAGNOSIS — H3530 Unspecified macular degeneration: Secondary | ICD-10-CM | POA: Insufficient documentation

## 2023-12-17 DIAGNOSIS — Z882 Allergy status to sulfonamides status: Secondary | ICD-10-CM | POA: Diagnosis not present

## 2023-12-17 DIAGNOSIS — E871 Hypo-osmolality and hyponatremia: Secondary | ICD-10-CM | POA: Diagnosis present

## 2023-12-17 DIAGNOSIS — Z79899 Other long term (current) drug therapy: Secondary | ICD-10-CM | POA: Diagnosis not present

## 2023-12-17 DIAGNOSIS — A400 Sepsis due to streptococcus, group A: Secondary | ICD-10-CM

## 2023-12-17 DIAGNOSIS — N390 Urinary tract infection, site not specified: Principal | ICD-10-CM

## 2023-12-17 DIAGNOSIS — Z885 Allergy status to narcotic agent status: Secondary | ICD-10-CM | POA: Diagnosis not present

## 2023-12-17 DIAGNOSIS — F0390 Unspecified dementia without behavioral disturbance: Secondary | ICD-10-CM | POA: Diagnosis present

## 2023-12-17 DIAGNOSIS — I10 Essential (primary) hypertension: Secondary | ICD-10-CM | POA: Diagnosis present

## 2023-12-17 DIAGNOSIS — E785 Hyperlipidemia, unspecified: Secondary | ICD-10-CM | POA: Diagnosis present

## 2023-12-17 DIAGNOSIS — Z87892 Personal history of anaphylaxis: Secondary | ICD-10-CM

## 2023-12-17 DIAGNOSIS — Z87440 Personal history of urinary (tract) infections: Secondary | ICD-10-CM

## 2023-12-17 DIAGNOSIS — I2582 Chronic total occlusion of coronary artery: Secondary | ICD-10-CM | POA: Diagnosis present

## 2023-12-17 DIAGNOSIS — I2510 Atherosclerotic heart disease of native coronary artery without angina pectoris: Secondary | ICD-10-CM | POA: Diagnosis present

## 2023-12-17 DIAGNOSIS — I252 Old myocardial infarction: Secondary | ICD-10-CM | POA: Diagnosis not present

## 2023-12-17 DIAGNOSIS — N136 Pyonephrosis: Secondary | ICD-10-CM | POA: Diagnosis present

## 2023-12-17 DIAGNOSIS — Z880 Allergy status to penicillin: Secondary | ICD-10-CM

## 2023-12-17 DIAGNOSIS — N1330 Unspecified hydronephrosis: Secondary | ICD-10-CM | POA: Diagnosis not present

## 2023-12-17 DIAGNOSIS — I350 Nonrheumatic aortic (valve) stenosis: Secondary | ICD-10-CM | POA: Insufficient documentation

## 2023-12-17 DIAGNOSIS — Z7982 Long term (current) use of aspirin: Secondary | ICD-10-CM | POA: Diagnosis not present

## 2023-12-17 DIAGNOSIS — Z881 Allergy status to other antibiotic agents status: Secondary | ICD-10-CM

## 2023-12-17 DIAGNOSIS — G9341 Metabolic encephalopathy: Secondary | ICD-10-CM | POA: Diagnosis present

## 2023-12-17 DIAGNOSIS — I214 Non-ST elevation (NSTEMI) myocardial infarction: Secondary | ICD-10-CM | POA: Diagnosis present

## 2023-12-17 DIAGNOSIS — N135 Crossing vessel and stricture of ureter without hydronephrosis: Secondary | ICD-10-CM | POA: Diagnosis not present

## 2023-12-17 DIAGNOSIS — E876 Hypokalemia: Secondary | ICD-10-CM | POA: Insufficient documentation

## 2023-12-17 DIAGNOSIS — Z888 Allergy status to other drugs, medicaments and biological substances status: Secondary | ICD-10-CM | POA: Diagnosis not present

## 2023-12-17 DIAGNOSIS — I2489 Other forms of acute ischemic heart disease: Secondary | ICD-10-CM | POA: Diagnosis not present

## 2023-12-17 DIAGNOSIS — R55 Syncope and collapse: Secondary | ICD-10-CM | POA: Diagnosis present

## 2023-12-17 DIAGNOSIS — R6520 Severe sepsis without septic shock: Secondary | ICD-10-CM

## 2023-12-17 DIAGNOSIS — Z955 Presence of coronary angioplasty implant and graft: Secondary | ICD-10-CM

## 2023-12-17 DIAGNOSIS — A419 Sepsis, unspecified organism: Secondary | ICD-10-CM

## 2023-12-17 LAB — CBC WITH DIFFERENTIAL/PLATELET
Abs Immature Granulocytes: 0.03 10*3/uL (ref 0.00–0.07)
Basophils Absolute: 0 10*3/uL (ref 0.0–0.1)
Basophils Relative: 0 %
Eosinophils Absolute: 0 10*3/uL (ref 0.0–0.5)
Eosinophils Relative: 0 %
HCT: 35 % — ABNORMAL LOW (ref 36.0–46.0)
Hemoglobin: 11.7 g/dL — ABNORMAL LOW (ref 12.0–15.0)
Immature Granulocytes: 0 %
Lymphocytes Relative: 10 %
Lymphs Abs: 1 10*3/uL (ref 0.7–4.0)
MCH: 30.8 pg (ref 26.0–34.0)
MCHC: 33.4 g/dL (ref 30.0–36.0)
MCV: 92.1 fL (ref 80.0–100.0)
Monocytes Absolute: 1.3 10*3/uL — ABNORMAL HIGH (ref 0.1–1.0)
Monocytes Relative: 12 %
Neutro Abs: 7.8 10*3/uL — ABNORMAL HIGH (ref 1.7–7.7)
Neutrophils Relative %: 78 %
Platelets: 187 10*3/uL (ref 150–400)
RBC: 3.8 MIL/uL — ABNORMAL LOW (ref 3.87–5.11)
RDW: 14.5 % (ref 11.5–15.5)
WBC: 10.2 10*3/uL (ref 4.0–10.5)
nRBC: 0 % (ref 0.0–0.2)

## 2023-12-17 LAB — COMPREHENSIVE METABOLIC PANEL WITH GFR
ALT: 13 U/L (ref 0–44)
AST: 22 U/L (ref 15–41)
Albumin: 3.4 g/dL — ABNORMAL LOW (ref 3.5–5.0)
Alkaline Phosphatase: 51 U/L (ref 38–126)
Anion gap: 11 (ref 5–15)
BUN: 22 mg/dL (ref 8–23)
CO2: 25 mmol/L (ref 22–32)
Calcium: 8.9 mg/dL (ref 8.9–10.3)
Chloride: 92 mmol/L — ABNORMAL LOW (ref 98–111)
Creatinine, Ser: 0.56 mg/dL (ref 0.44–1.00)
GFR, Estimated: >60 mL/min (ref >60)
Glucose, Bld: 123 mg/dL — ABNORMAL HIGH (ref 70–99)
Potassium: 3.6 mmol/L (ref 3.5–5.1)
Sodium: 128 mmol/L — ABNORMAL LOW (ref 135–145)
Total Bilirubin: 1.1 mg/dL (ref 0.0–1.2)
Total Protein: 6.7 g/dL (ref 6.5–8.1)

## 2023-12-17 LAB — PROTIME-INR
INR: 1.1 (ref 0.8–1.2)
Prothrombin Time: 13.9 s (ref 11.4–15.2)

## 2023-12-17 LAB — URINALYSIS, W/ REFLEX TO CULTURE (INFECTION SUSPECTED)
Bilirubin Urine: NEGATIVE
Glucose, UA: 50 mg/dL — AB
Ketones, ur: 5 mg/dL — AB
Nitrite: NEGATIVE
Protein, ur: NEGATIVE mg/dL
Specific Gravity, Urine: 1.012 (ref 1.005–1.030)
Squamous Epithelial / HPF: 0 /HPF (ref 0–5)
WBC, UA: >50 WBC/hpf (ref 0–5)
pH: 7 (ref 5.0–8.0)

## 2023-12-17 LAB — TROPONIN I (HIGH SENSITIVITY)
Troponin I (High Sensitivity): 174 ng/L (ref <18)
Troponin I (High Sensitivity): 571 ng/L (ref <18)
Troponin I (High Sensitivity): 2751 ng/L (ref <18)

## 2023-12-17 LAB — LACTIC ACID, PLASMA
Lactic Acid, Venous: 1.6 mmol/L (ref 0.5–1.9)
Lactic Acid, Venous: 1.1 mmol/L (ref 0.5–1.9)

## 2023-12-17 MED ORDER — ACETAMINOPHEN 500 MG PO TABS
1000.0000 mg | ORAL_TABLET | ORAL | Status: AC
  Administered 2023-12-17: 1000 mg via ORAL
  Filled 2023-12-17: qty 2

## 2023-12-17 MED ORDER — EZETIMIBE 10 MG PO TABS
10.0000 mg | ORAL_TABLET | Freq: Every day | ORAL | Status: DC
  Administered 2023-12-18 – 2023-12-20 (×4): 10 mg via ORAL
  Filled 2023-12-17 (×5): qty 1

## 2023-12-17 MED ORDER — ONDANSETRON HCL 4 MG PO TABS: 4.0000 mg | ORAL_TABLET | Freq: Four times a day (QID) | ORAL | Status: DC | PRN

## 2023-12-17 MED ORDER — SODIUM CHLORIDE 0.9 % IV SOLN
1.0000 g | INTRAVENOUS | Status: AC
  Administered 2023-12-17: 1 g via INTRAVENOUS
  Filled 2023-12-17: qty 10

## 2023-12-17 MED ORDER — CLOPIDOGREL BISULFATE 75 MG PO TABS
75.0000 mg | ORAL_TABLET | Freq: Every day | ORAL | Status: DC
  Administered 2023-12-18 – 2023-12-21 (×5): 75 mg via ORAL
  Filled 2023-12-17 (×5): qty 1

## 2023-12-17 MED ORDER — ACETAMINOPHEN 650 MG RE SUPP: 650.0000 mg | Freq: Four times a day (QID) | RECTAL | Status: DC | PRN

## 2023-12-17 MED ORDER — POLYETHYLENE GLYCOL 3350 17 G PO PACK: 17.0000 g | PACK | Freq: Every day | ORAL | Status: DC | PRN

## 2023-12-17 MED ORDER — ISOSORBIDE MONONITRATE ER 30 MG PO TB24
120.0000 mg | ORAL_TABLET | Freq: Every day | ORAL | Status: DC
  Administered 2023-12-18 – 2023-12-21 (×4): 120 mg via ORAL
  Filled 2023-12-17 (×4): qty 4

## 2023-12-17 MED ORDER — ASPIRIN 81 MG PO TBEC
81.0000 mg | DELAYED_RELEASE_TABLET | Freq: Every day | ORAL | Status: DC
  Administered 2023-12-18 – 2023-12-21 (×4): 81 mg via ORAL
  Filled 2023-12-17 (×4): qty 1

## 2023-12-17 MED ORDER — AMLODIPINE BESYLATE 5 MG PO TABS
5.0000 mg | ORAL_TABLET | Freq: Every day | ORAL | Status: DC
  Administered 2023-12-18 – 2023-12-21 (×4): 5 mg via ORAL
  Filled 2023-12-17 (×4): qty 1

## 2023-12-17 MED ORDER — HEPARIN (PORCINE) 25000 UT/250ML-% IV SOLN
1150.0000 [IU]/h | INTRAVENOUS | Status: AC
  Administered 2023-12-17: 750 [IU]/h via INTRAVENOUS
  Administered 2023-12-18 – 2023-12-19 (×2): 1150 [IU]/h via INTRAVENOUS
  Filled 2023-12-17 (×3): qty 250

## 2023-12-17 MED ORDER — IOHEXOL 350 MG/ML SOLN
100.0000 mL | Freq: Once | INTRAVENOUS | Status: AC | PRN
Start: 2023-12-17 — End: 2023-12-17
  Administered 2023-12-17: 100 mL via INTRAVENOUS

## 2023-12-17 MED ORDER — HYDROMORPHONE HCL 1 MG/ML IJ SOLN: 0.5000 mg | INTRAMUSCULAR | Status: DC | PRN

## 2023-12-17 MED ORDER — HEPARIN BOLUS VIA INFUSION
3500.0000 [IU] | Freq: Once | INTRAVENOUS | Status: AC
  Administered 2023-12-17: 3500 [IU] via INTRAVENOUS
  Filled 2023-12-17: qty 3500

## 2023-12-17 MED ORDER — ONDANSETRON HCL 4 MG/2ML IJ SOLN: 4.0000 mg | Freq: Four times a day (QID) | INTRAMUSCULAR | Status: DC | PRN

## 2023-12-17 MED ORDER — LACTATED RINGERS IV BOLUS
500.0000 mL | Freq: Once | INTRAVENOUS | Status: AC
  Administered 2023-12-17: 500 mL via INTRAVENOUS

## 2023-12-17 MED ORDER — SODIUM CHLORIDE 0.9 % IV SOLN: 1.0000 g | INTRAVENOUS | Status: DC

## 2023-12-17 MED ORDER — HYDROCODONE-ACETAMINOPHEN 5-325 MG PO TABS: 1.0000 | ORAL_TABLET | ORAL | Status: DC | PRN

## 2023-12-17 MED ORDER — METOPROLOL SUCCINATE ER 50 MG PO TB24
100.0000 mg | ORAL_TABLET | Freq: Every day | ORAL | Status: DC
  Administered 2023-12-18 – 2023-12-20 (×4): 100 mg via ORAL
  Filled 2023-12-17 (×4): qty 2

## 2023-12-17 MED ORDER — NITROGLYCERIN 0.4 MG SL SUBL: 0.4000 mg | SUBLINGUAL_TABLET | SUBLINGUAL | Status: DC | PRN

## 2023-12-17 MED ORDER — ASPIRIN 81 MG PO TBEC
162.0000 mg | DELAYED_RELEASE_TABLET | Freq: Once | ORAL | Status: AC
  Administered 2023-12-17: 162 mg via ORAL
  Filled 2023-12-17: qty 2

## 2023-12-17 MED ORDER — ACETAMINOPHEN 325 MG PO TABS
650.0000 mg | ORAL_TABLET | Freq: Four times a day (QID) | ORAL | Status: DC | PRN
  Administered 2023-12-20: 650 mg via ORAL
  Filled 2023-12-17: qty 2

## 2023-12-17 NOTE — ED Notes (Signed)
 Pt back from CT

## 2023-12-17 NOTE — ED Notes (Signed)
 Hospitalist at bedside

## 2023-12-17 NOTE — ED Provider Notes (Addendum)
 [facility] Provider Note    Event Date/Time   First MD Initiated Contact with Patient 12/17/23 1812     (approximate)   History   Loss of Consciousness   HPI  Alexis Perez is a 89 y.o. female with a history of NSTEMI and coronary disease  Patient was witnessed to pass out earlier at home.  Daughter reports that she has had brief episodes of passing out on several times in the last 3 days.  She went to the urgent care today and was diagnosed with urinary tract infection and started antibiotic for that today.  Patient reports she feels like her bra is tight.  She cannot describe it too well but reports sort of a sensation of tightness in the bra area, alleviated by removal of the bra by myself and her family members.  Patient has no other complaint but does also have a history of some cognitive impairment     Physical Exam   Triage Vital Signs: ED Triage Vitals  Encounter Vitals Group     BP 12/17/23 1800 (!) 186/83     Girls Systolic BP Percentile --      Girls Diastolic BP Percentile --      Boys Systolic BP Percentile --      Boys Diastolic BP Percentile --      Pulse Rate 12/17/23 1806 (!) 107     Resp 12/17/23 1806 (!) 22     Temp 12/17/23 1800 99.1 F (37.3 C)     Temp Source 12/17/23 1800 Oral     SpO2 12/17/23 1806 95 %     Weight --      Height --      Head Circumference --      Peak Flow --      Pain Score 12/17/23 1806 0     Pain Loc --      Pain Education --      Exclude from Growth Chart --     Most recent vital signs: Vitals:   12/17/23 1931 12/17/23 2137  BP: (!) 163/75 132/62  Pulse: (!) 111 (!) 108  Resp: (!) 29 (!) 24  Temp: (!) 100.6 F (38.1 C) 99.9 F (37.7 C)  SpO2: 93% 91%     General: Awake, no distress.  Pleasant recognizes daughter's without difficulty.  Does not appear in distress but does appear fatigued CV:  Good peripheral perfusion.  Mild tachycardia Resp:  Normal effort.  Clear bilateral with  normal work of breathing Abd:  No distention.  Soft nontender nondistended throughout Other:  Warm well-perfused extremities.  Hypertension noted   ED Results / Procedures / Treatments   Labs (all labs ordered are listed, but only abnormal results are displayed) Labs Reviewed  COMPREHENSIVE METABOLIC PANEL WITH GFR - Abnormal; Notable for the following components:      Result Value   Sodium 128 (*)    Chloride 92 (*)    Glucose, Bld 123 (*)    Albumin 3.4 (*)    All other components within normal limits  CBC WITH DIFFERENTIAL/PLATELET - Abnormal; Notable for the following components:   RBC 3.80 (*)    Hemoglobin 11.7 (*)    HCT 35.0 (*)    Neutro Abs 7.8 (*)    Monocytes Absolute 1.3 (*)    All other components within normal limits  URINALYSIS, W/ REFLEX TO CULTURE (INFECTION SUSPECTED) - Abnormal; Notable for the following components:   Color, Urine STRAW (*)  APPearance HAZY (*)    Glucose, UA 50 (*)    Hgb urine dipstick SMALL (*)    Ketones, ur 5 (*)    Leukocytes,Ua LARGE (*)    Bacteria, UA FEW (*)    All other components within normal limits  TROPONIN I (HIGH SENSITIVITY) - Abnormal; Notable for the following components:   Troponin I (High Sensitivity) 174 (*)    All other components within normal limits  TROPONIN I (HIGH SENSITIVITY) - Abnormal; Notable for the following components:   Troponin I (High Sensitivity) 571 (*)    All other components within normal limits  CULTURE, BLOOD (ROUTINE X 2)  CULTURE, BLOOD (ROUTINE X 2)  URINE CULTURE  LACTIC ACID, PLASMA  LACTIC ACID, PLASMA  PROTIME-INR  Labs noted minimally elevated troponin 174.  Hyponatremia mild to moderate with sodium 128.  Normal white count hemoglobin 11.7  Normal lactic acid  Also reviewed urinalysis from urgent care, bacteria white cells leukocytes and nitrate positive.  Consistent with urinary tract infection   EKG  And interpreted by me at 1810 heart rate 110 QRS 80 QTc 430 Sinus  tachycardia, considerable ST segment depression indicative of possible subendocardial ischemia, NSTEMI or acute cardiac abnormality denoted.  Primarily noted over the lateral leads.  No STEMI  Discussed EKG and clinical case, elev trop,  Dr. Nolan Battle. Asa, advises follow/trend troponin, if notably uptrend may consider heparin  [from cardiac standpoint]   RADIOLOGY  CT Angio Chest/Abd/Pel for Dissection W and/or Wo Contrast Result Date: 12/17/2023 EXAM: CTA CHEST, ABDOMEN AND PELVIS WITHOUT AND WITH CONTRAST 12/17/2023 07:45:54 PM TECHNIQUE: CTA of the chest was performed without and with the administration of intravenous contrast. CTA of the abdomen and pelvis was performed without and with the administration of intravenous contrast. Multiplanar reformatted images are provided for review. MIP images are provided for review. Automated exposure control, iterative reconstruction, and/or weight based adjustment of the mA/kV was utilized to reduce the radiation dose to as low as reasonably achievable. COMPARISON: None available. CLINICAL HISTORY: Aortic aneurysm suspected. Pt BIB [facility] from home with reports of syncope, recent dx of UTI yesterday, and hyponatremia. Denies pain. FINDINGS: VASCULATURE: AORTA: No evidence of thoracic aortic aneurysm or dissection. Atherosclerotic calcification of the aortic arch. No evidence of abdominal aortic aneurysm or dissection. Atherosclerotic calcifications. PULMONARY ARTERIES: Although not tailored for evaluation of the pulmonary arteries, there is no evidence of pulmonary embolism. GREAT VESSELS OF AORTIC ARCH: No acute finding. No dissection. No arterial occlusion or significant stenosis. CELIAC TRUNK: The celiac artery is patent, with atherosclerotic calcification. SUPERIOR MESENTERIC ARTERY: The SMA is patent, with atherosclerotic calcification. INFERIOR MESENTERIC ARTERY: No acute finding. No occlusion or significant stenosis. RENAL ARTERIES: Bilateral renal arteries are  patent, with atherosclerotic calcifications at the origin. ILIAC ARTERIES: Atherosclerotic calcification of the bilateral iliac arteries, although patent. CHEST: MEDIASTINUM: No mediastinal lymphadenopathy. The heart and pericardium demonstrate no acute abnormality. Severe 3-vessel coronary atherosclerosis. Dominant 3.4 cm left thyroid nodule (image 29). Given the patient's age, no follow-up is recommended. LUNGS AND PLEURA: Mild subpleural reticulation/fibrosis in the lungs bilaterally, lower lobe predominant suggesting mild chronic interstitial lung disease or post-infectious inflammatory scarring. Mild interlobular septal thickening in the bilateral upper lobes (for example, image 56) at least raising the possibility of mild interstitial edema. Scattered small bilateral pulmonary nodules measuring up to 4 mm likely benign. Given the patient's age, no dedicated follow-up imaging is suggested. No evidence of pleural effusion or pneumothorax. THORACIC BONES AND SOFT TISSUES: No acute findings.  ABDOMEN AND PELVIS: LIVER: Scattered hepatic cysts measuring up to 6.5 cm in segment 4, benign. GALLBLADDER AND BILE DUCTS: Gallbladder is unremarkable. No biliary ductal dilatation. SPLEEN: The spleen is unremarkable. PANCREAS: The pancreas is unremarkable. ADRENAL GLANDS: Bilateral adrenal glands demonstrate no acute abnormality. KIDNEYS, URETERS AND BLADDER: Simple bilateral renal cysts are present measuring up to 2.7 cm (image 179), benign Bosniak 1. No follow-up is recommended. 2.2 cm hyperdense lesion in the medial right lower kidney (image 199), indeterminate. Given the patient's age, no follow-up is recommended. 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 Very mild soft tissue prominence at the right UVJ is also possible (image 268). Unclear that urologic evaluation is beneficial in this patient. No stones in the kidneys or  ureters. No perinephric or periureteral stranding. Urinary bladder is unremarkable. GI AND BOWEL: The appendix is not visualized. There is moderate right colonic stool burden, suggesting mild constipation. Stomach and duodenal sweep demonstrate no acute abnormality. There is no bowel obstruction. No abnormal bowel wall thickening or distension. REPRODUCTIVE: Reproductive organs are unremarkable. PERITONEUM AND RETROPERITONEUM: No ascites or free air. LYMPH NODES: No lymphadenopathy. ABDOMINAL BONES AND SOFT TISSUES: Mild degenerative changes of the lumbar spine. No acute soft tissue abnormality. IMPRESSION: 1. No thoracoabdominal aortic aneurysm or dissection. 2. 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 Urology consultation can be considered but unclear that aggressive evaluation is appropriate. 3. Possible mild interstitial edema, equivocal. No pleural effusion. 4. Additional ancillary findings as above. No follow-up is recommended given the patient's age. Electronically signed by: Zadie Herter MD 12/17/2023 08:56 PM EDT RP Workstation: [id number]   CT Head Wo Contrast Result Date: 12/17/2023 CLINICAL DATA:  Delirium EXAM: CT HEAD WITHOUT CONTRAST TECHNIQUE: Contiguous axial images were obtained from the base of the skull through the vertex without intravenous contrast. RADIATION DOSE REDUCTION: This exam was performed according to the departmental dose-optimization program which includes automated exposure control, adjustment of the mA and/or kV according to patient size and/or use of iterative reconstruction technique. COMPARISON:  None Available. FINDINGS: Brain: Hypodensities throughout the periventricular white matter and basal ganglia consistent with chronic small vessel ischemic changes. No evidence of acute infarct or hemorrhage. Lateral ventricles and remaining midline  structures are unremarkable. No acute extra-axial fluid collections. No mass effect. Vascular: No hyperdense vessel or unexpected calcification. Skull: Normal. Negative for fracture or focal lesion. Sinuses/Orbits: Polypoid mucosal thickening of the maxillary and frontal sinuses. Other: None. IMPRESSION: 1. No acute intracranial process. Electronically Signed   By: Bobbye Burrow M.D.   On: 12/17/2023 20:02   DG Chest Port 1 View Result Date: 12/17/2023 EXAM: 1 VIEW XRAY OF THE CHEST 12/17/2023 06:58:00 PM COMPARISON: 10/11/2021 CLINICAL HISTORY: Questionable sepsis - evaluate for abnormality. Pt BIB [facility] from home with reports of syncope, recent dx of UTI yesterday, and hyponatremia. Denies pain. Pt received 500cc NacL en route. FINDINGS: LUNGS AND PLEURA: Increased interstitial markings, chronic, without frank interstitial edema. No focal pulmonary opacity. No pleural effusion. No pneumothorax. HEART AND MEDIASTINUM: No acute abnormality of the cardiac and mediastinal silhouettes. Thoracic aortic atherosclerosis. BONES AND SOFT TISSUES: No acute osseous abnormality. IMPRESSION: 1. No acute findings. Electronically signed by: Zadie Herter MD 12/17/2023 07:08 PM EDT RP Workstation: [id number]       PROCEDURES:  Critical Care performed: Yes, see critical care procedure note(s)  CRITICAL CARE Performed by: Lavonia Powers  Cathryne Mancebo   Total critical care time: 30 minutes  Critical care time was exclusive of separately billable procedures and treating other patients.  Critical care was necessary to treat or prevent imminent or life-threatening deterioration.  Critical care was time spent personally by me on the following activities: development of treatment plan with patient and/or surrogate as well as nursing, discussions with consultants, evaluation of patient's response to treatment, examination of patient, obtaining history from patient or surrogate, ordering and performing treatments and interventions,  ordering and review of laboratory studies, ordering and review of radiographic studies, pulse oximetry and re-evaluation of patient's condition.   Procedures   MEDICATIONS ORDERED IN ED: Medications  iohexol (OMNIPAQUE) 350 MG/ML injection 100 mL (100 mLs Intravenous Contrast Given 12/17/23 1940)  cefTRIAXone  (ROCEPHIN ) 1 g in sodium chloride  0.9 % 100 mL IVPB (0 g Intravenous Stopped 12/17/23 2112)  acetaminophen  (TYLENOL ) tablet 1,000 mg (1,000 mg Oral Given 12/17/23 2107)  aspirin  EC tablet 162 mg (162 mg Oral Given 12/17/23 2107)  lactated ringers bolus 500 mL (500 mLs Intravenous New Bag/Given 12/17/23 2137)     IMPRESSION / MDM / ASSESSMENT AND PLAN / ED COURSE  I reviewed the triage vital signs and the nursing notes.                              Differential diagnosis includes, but is not limited to, sepsis, cardiac syncope dysrhythmia, STEMI/NSTEMI or ACS considered, etc.  Given the associated urinalysis from urgent care as well as her presentation febrile nature tachycardia suspect sepsis is present likely from urinary tract infection.  Reviewed CT imaging to exclude dissection given the initial hypertension and presentation, no obvious dissection noted.  Discussed with our urologist as well as our cardiology team.  Discussed with patient's family at bedside updated on plan of care agreeable and understand plan for admission treatment for urinary tract infection and close following of cardiac condition etc.  Troponin continues to trend upward, discussed with hospitalist and they will see and evaluate and make consideration for whether or not heparin  will need to be initiated but at this time reassuring that the patient is currently resting, no ongoing chest pain.  I am suspicious this represents demand ischemia though NSTEMI certainly on spectrum as well  Patient's presentation is most consistent with acute complicated illness / injury requiring diagnostic workup.   The patient is  on the cardiac monitor to evaluate for evidence of arrhythmia and/or significant heart rate changes.   Clinical Course as of 12/17/23 2205  Tue Dec 17, 2023  2137 Reviewed CT, labs, history with Dr. Cherylene Corrente. No acute surgical intervention, recommends IV antibiotics and urology will place note in chart [MQ]    Clinical Course User Index [MQ] Iver Marker, MD  Consulted with and patient accepted to hospitalist by Dr. Giles Labrum   FINAL CLINICAL IMPRESSION(S) / ED DIAGNOSES   Final diagnoses:  Urinary tract infection, acute  Sepsis, due to unspecified organism, unspecified whether acute organ dysfunction present [facility])  Syncope and collapse  Demand ischemia (HCC)  Hydronephrosis, right  Hyponatremia     Rx / DC Orders   ED Discharge Orders     None        Note:  This document was prepared using Dragon voice recognition software and may include unintentional dictation errors.   Iver Marker, MD 12/17/23 7564    Iver Marker, MD 12/17/23 2206

## 2023-12-17 NOTE — ED Notes (Signed)
 Pt to CT

## 2023-12-17 NOTE — Progress Notes (Signed)
 PHARMACY - ANTICOAGULATION CONSULT NOTE  Pharmacy Consult for Heparin   Indication: chest pain/ACS  Allergies  Allergen Reactions   Fentanyl Nausea And Vomiting   Sodium Pentobarbital [Pentobarbital] Anaphylaxis    When pt was sedated for surgery   Hydrochlorothiazide Other (See Comments)    hyponatremia   Ciprofloxacin      Blurry vision, sleepy and make pt sick   Codeine Other (See Comments)    Other reaction(s): UNKNOWN   Penicillin G Rash   Sulfa Antibiotics Rash    Patient Measurements: Height: 5' (152.4 cm) Weight: 59.4 kg (131 lb) IBW/kg (Calculated) : 45.5 HEPARIN  DW (KG): 57.6  Vital Signs: Temp: 99.9 F (37.7 C) (06/17 2137) Temp Source: Oral (06/17 2137) BP: 132/62 (06/17 2137) Pulse Rate: 108 (06/17 2137)  Labs: Recent Labs    12/17/23 1813 12/17/23 2009  HGB 11.7*  --   HCT 35.0*  --   PLT 187  --   LABPROT 13.9  --   INR 1.1  --   CREATININE 0.56  --   TROPONINIHS 174* 571*    Estimated Creatinine Clearance: 31.7 mL/min (by C-G formula based on SCr of 0.56 mg/dL).   Medical History: Past Medical History:  Diagnosis Date   Hypertension     Medications:  (Not in a hospital admission)   Assessment: Pharmacy consulted to dose heparin  in this 89 year old female admitted with ACS/NSTEMI.  No prior anticoag noted. CrCl = 31.7 ml/min   Goal of Therapy:  Heparin  level 0.3-0.7 units/ml Monitor platelets by anticoagulation protocol: Yes   Plan:  - Heparin  3500 units IV X 1 bolus ordered followed by heparin  drip to start at 750 units/hr - Will draw HL 8 hrs after start of drip - CBC daily   Akram Kissick D 12/17/2023,10:21 PM

## 2023-12-17 NOTE — ED Triage Notes (Signed)
 Pt BIB [facility] from home with reports of syncope, recent dx of UTI yesterday, and hyponatremia. Denies pain.  Pt received 500cc NacL en route.

## 2023-12-17 NOTE — Sepsis Progress Note (Signed)
 Elink monitoring for the code sepsis protocol.

## 2023-12-17 NOTE — ED Notes (Signed)
Dr. Fanny Bien at bedside.

## 2023-12-17 NOTE — H&P (Incomplete)
 History and Physical    Raneshia Derick XBJ:[id number] DOB: 1923-09-30 DOA: 12/17/2023  DOS: the patient was seen and examined on 12/17/2023  PCP: Edelmira Goodness, MD   Patient coming from: Home  I have personally briefly reviewed patient's old medical records in [facility]  Chief Complaint: Multiple syncopal episode at home  HPI: Alexis Perez is a pleasant 89 y.o. female with medical history significant for CAD s/p stent in 2022, HTN, dementia, history of urinary tract infection, HLD, history of hyponatremia who was recently diagnosed having UTI and was given oral cephalosporin antibiotic for UTI.  She received 1 dose but she continued to have brief syncopal episode at home.  Patient's daughter Devra Fontana who was concerned and brought in to emergency room for evaluation.  As per the patient's daughter patient had 2 or 3 episodes of very brief passing out event but she would be normal after that.  There was no post syncopal confusion.  There was no fall or loss of urine or bladder.  The patient had a fever but they did not check the temperature at home.  Patient does not have nausea vomiting abdominal pain chest pain shortness of breath palpitations.  She denies hitting her head or any trauma.  ED Course: Upon arrival to the ED, patient is found to have elevated troponin and positive urinary tract infection.  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  Urologist evaluated the patient and advised for treating UTI at this point.  Cardiologist Dr. Nolan Battle was contacted by ED for elevated troponin.  Dr. Nolan Battle advised to start on heparin  drip and treat medically at this point.  Hospitalist service was consulted for evaluation for admission for possible sepsis due to UTI and non-ST elevation myocardial infarction/demand ischemia.  Review of Systems:  ROS  All other systems negative except as noted in the HPI.  Past Medical History:  Diagnosis Date   Hypertension     Past  Surgical History:  Procedure Laterality Date   ANKLE FRACTURE SURGERY Left 1999     reports that she has never smoked. She has never used smokeless tobacco. She reports that she does not drink alcohol  and does not use drugs.  Allergies  Allergen Reactions   Fentanyl Nausea And Vomiting   Sodium Pentobarbital [Pentobarbital] Anaphylaxis    When pt was sedated for surgery   Hydrochlorothiazide Other (See Comments)    hyponatremia   Ciprofloxacin      Blurry vision, sleepy and make pt sick   Codeine Other (See Comments)    Other reaction(s): UNKNOWN   Penicillin G Rash   Sulfa Antibiotics Rash    History reviewed. No pertinent family history.  Prior to Admission medications   Medication Sig Start Date End Date Taking? Authorizing Provider  aspirin  EC 81 MG tablet Take 81 mg by mouth daily. Swallow whole.    [provider]  Calcium -Vitamin D -Vitamin K 500-100-40 MG-UNT-MCG CHEW Chew 1 tablet by mouth daily.    [provider]  Cholecalciferol  50 MCG (2000 UT) CAPS Take 1 capsule by mouth daily.    [provider]  clopidogrel  (PLAVIX ) 75 MG tablet Take 75 mg by mouth daily. 07/22/21   [provider]  ezetimibe  (ZETIA ) 10 MG tablet Take 10 mg by mouth daily. 07/22/21   [provider]  isosorbide  mononitrate (IMDUR ) 30 MG 24 hr tablet Take 120 mg by mouth daily. 08/20/21   [provider]  metoprolol  succinate (TOPROL -XL) 50 MG 24  hr tablet Take 50 mg by mouth daily. 08/09/21   [provider]  Multiple Vitamins-Minerals (PRESERVISION AREDS 2) CAPS Take 1 capsule by mouth daily. 03/19/19   [provider]  nitroGLYCERIN  (NITROSTAT ) 0.4 MG SL tablet Place under the tongue. 07/07/21   [provider]  rosuvastatin  (CRESTOR ) 10 MG tablet Take 1 tablet (10 mg total) by mouth at bedtime. Patient not taking: Reported on 10/11/2021 10/04/21   Donaciano Frizzle, MD    Physical Exam: Vitals:   12/17/23 1931 12/17/23 2137  12/17/23 2216 12/17/23 2244  BP: (!) 163/75 132/62  (!) 127/56  Pulse: (!) 111 (!) 108  (!) 105  Resp: (!) 29 (!) 24  20  Temp: (!) 100.6 F (38.1 C) 99.9 F (37.7 C)  99 F (37.2 C)  TempSrc: Oral Oral  Oral  SpO2: 93% 91%  92%  Weight:   59.4 kg   Height:   5' (1.524 m)     Physical Exam   Constitutional: Alert, awake, calm, comfortable HEENT: Neck supple Respiratory: Clear to auscultation B/L, no wheezing, no rales.  Cardiovascular: Regular rate and rhythm, no murmurs / rubs / gallops. No extremity edema. 2+ pedal pulses. No carotid bruits.  Abdomen: Soft, no tenderness, Bowel sounds positive.  Musculoskeletal: no clubbing / cyanosis. Good ROM, no contractures. Normal muscle tone.  Skin: no rashes, lesions, ulcers. Neurologic: CN 2-12 grossly intact. Sensation intact, No focal deficit identified Psychiatric: Alert and oriented x 3. Normal mood.    Labs on Admission: I have personally reviewed following labs and imaging studies  CBC: Recent Labs  Lab 12/17/23 1813  WBC 10.2  NEUTROABS 7.8*  HGB 11.7*  HCT 35.0*  MCV 92.1  PLT 187   Basic Metabolic Panel: Recent Labs  Lab 12/17/23 1813  NA 128*  K 3.6  CL 92*  CO2 25  GLUCOSE 123*  BUN 22  CREATININE 0.56  CALCIUM  8.9   GFR: Estimated Creatinine Clearance: 31.7 mL/min (by C-G formula based on SCr of 0.56 mg/dL). Liver Function Tests: Recent Labs  Lab 12/17/23 1813  AST 22  ALT 13  ALKPHOS 51  BILITOT 1.1  PROT 6.7  ALBUMIN 3.4*   No results for input(s): LIPASE, AMYLASE in the last 168 hours. No results for input(s): AMMONIA in the last 168 hours. Coagulation Profile: Recent Labs  Lab 12/17/23 1813  INR 1.1   Cardiac Enzymes: Recent Labs  Lab 12/17/23 1813 12/17/23 2009  TROPONINIHS 174* 571*    Urine analysis:    Component Value Date/Time   COLORURINE STRAW (A) 12/17/2023 2009   APPEARANCEUR HAZY (A) 12/17/2023 2009   LABSPEC 1.012 12/17/2023 2009   PHURINE 7.0 12/17/2023  2009   GLUCOSEU 50 (A) 12/17/2023 2009   HGBUR SMALL (A) 12/17/2023 2009   BILIRUBINUR NEGATIVE 12/17/2023 2009   KETONESUR 5 (A) 12/17/2023 2009   PROTEINUR NEGATIVE 12/17/2023 2009   NITRITE NEGATIVE 12/17/2023 2009   LEUKOCYTESUR LARGE (A) 12/17/2023 2009    Radiological Exams on Admission: I have personally reviewed images CT Angio Chest/Abd/Pel for Dissection W and/or Wo Contrast Result Date: 12/17/2023 EXAM: CTA CHEST, ABDOMEN AND PELVIS WITHOUT AND WITH CONTRAST 12/17/2023 07:45:54 PM TECHNIQUE: CTA of the chest was performed without and with the administration of intravenous contrast. CTA of the abdomen and pelvis was performed without and with the administration of intravenous contrast. Multiplanar reformatted images are provided for review. MIP images are provided for review. Automated exposure control, iterative reconstruction, and/or weight based adjustment of the  mA/kV was utilized to reduce the radiation dose to as low as reasonably achievable. COMPARISON: None available. CLINICAL HISTORY: Aortic aneurysm suspected. Pt BIB [facility] from home with reports of syncope, recent dx of UTI yesterday, and hyponatremia. Denies pain. FINDINGS: VASCULATURE: AORTA: No evidence of thoracic aortic aneurysm or dissection. Atherosclerotic calcification of the aortic arch. No evidence of abdominal aortic aneurysm or dissection. Atherosclerotic calcifications. PULMONARY ARTERIES: Although not tailored for evaluation of the pulmonary arteries, there is no evidence of pulmonary embolism. GREAT VESSELS OF AORTIC ARCH: No acute finding. No dissection. No arterial occlusion or significant stenosis. CELIAC TRUNK: The celiac artery is patent, with atherosclerotic calcification. SUPERIOR MESENTERIC ARTERY: The SMA is patent, with atherosclerotic calcification. INFERIOR MESENTERIC ARTERY: No acute finding. No occlusion or significant stenosis. RENAL ARTERIES: Bilateral renal arteries are patent, with atherosclerotic  calcifications at the origin. ILIAC ARTERIES: Atherosclerotic calcification of the bilateral iliac arteries, although patent. CHEST: MEDIASTINUM: No mediastinal lymphadenopathy. The heart and pericardium demonstrate no acute abnormality. Severe 3-vessel coronary atherosclerosis. Dominant 3.4 cm left thyroid nodule (image 29). Given the patient's age, no follow-up is recommended. LUNGS AND PLEURA: Mild subpleural reticulation/fibrosis in the lungs bilaterally, lower lobe predominant suggesting mild chronic interstitial lung disease or post-infectious inflammatory scarring. Mild interlobular septal thickening in the bilateral upper lobes (for example, image 56) at least raising the possibility of mild interstitial edema. Scattered small bilateral pulmonary nodules measuring up to 4 mm likely benign. Given the patient's age, no dedicated follow-up imaging is suggested. No evidence of pleural effusion or pneumothorax. THORACIC BONES AND SOFT TISSUES: No acute findings. ABDOMEN AND PELVIS: LIVER: Scattered hepatic cysts measuring up to 6.5 cm in segment 4, benign. GALLBLADDER AND BILE DUCTS: Gallbladder is unremarkable. No biliary ductal dilatation. SPLEEN: The spleen is unremarkable. PANCREAS: The pancreas is unremarkable. ADRENAL GLANDS: Bilateral adrenal glands demonstrate no acute abnormality. KIDNEYS, URETERS AND BLADDER: Simple bilateral renal cysts are present measuring up to 2.7 cm (image 179), benign Bosniak 1. No follow-up is recommended. 2.2 cm hyperdense lesion in the medial right lower kidney (image 199), indeterminate. Given the patient's age, no follow-up is recommended. 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 Very mild soft tissue prominence at the right UVJ is also possible (image 268). Unclear that urologic evaluation is beneficial in this patient. No stones in the kidneys or ureters. No perinephric or  periureteral stranding. Urinary bladder is unremarkable. GI AND BOWEL: The appendix is not visualized. There is moderate right colonic stool burden, suggesting mild constipation. Stomach and duodenal sweep demonstrate no acute abnormality. There is no bowel obstruction. No abnormal bowel wall thickening or distension. REPRODUCTIVE: Reproductive organs are unremarkable. PERITONEUM AND RETROPERITONEUM: No ascites or free air. LYMPH NODES: No lymphadenopathy. ABDOMINAL BONES AND SOFT TISSUES: Mild degenerative changes of the lumbar spine. No acute soft tissue abnormality. IMPRESSION: 1. No thoracoabdominal aortic aneurysm or dissection. 2. 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 Urology consultation can be considered but unclear that aggressive evaluation is appropriate. 3. Possible mild interstitial edema, equivocal. No pleural effusion. 4. Additional ancillary findings as above. No follow-up is recommended given the patient's age. Electronically signed by: Zadie Herter MD 12/17/2023 08:56 PM EDT RP Workstation: [id number]   CT Head Wo Contrast Result Date: 12/17/2023 CLINICAL DATA:  Delirium EXAM: CT HEAD WITHOUT CONTRAST TECHNIQUE: Contiguous axial images were obtained from the base of the skull  through the vertex without intravenous contrast. RADIATION DOSE REDUCTION: This exam was performed according to the departmental dose-optimization program which includes automated exposure control, adjustment of the mA and/or kV according to patient size and/or use of iterative reconstruction technique. COMPARISON:  None Available. FINDINGS: Brain: Hypodensities throughout the periventricular white matter and basal ganglia consistent with chronic small vessel ischemic changes. No evidence of acute infarct or hemorrhage. Lateral ventricles and remaining midline structures are unremarkable. No  acute extra-axial fluid collections. No mass effect. Vascular: No hyperdense vessel or unexpected calcification. Skull: Normal. Negative for fracture or focal lesion. Sinuses/Orbits: Polypoid mucosal thickening of the maxillary and frontal sinuses. Other: None. IMPRESSION: 1. No acute intracranial process. Electronically Signed   By: Bobbye Burrow M.D.   On: 12/17/2023 20:02   DG Chest Port 1 View Result Date: 12/17/2023 EXAM: 1 VIEW XRAY OF THE CHEST 12/17/2023 06:58:00 PM COMPARISON: 10/11/2021 CLINICAL HISTORY: Questionable sepsis - evaluate for abnormality. Pt BIB [facility] from home with reports of syncope, recent dx of UTI yesterday, and hyponatremia. Denies pain. Pt received 500cc NacL en route. FINDINGS: LUNGS AND PLEURA: Increased interstitial markings, chronic, without frank interstitial edema. No focal pulmonary opacity. No pleural effusion. No pneumothorax. HEART AND MEDIASTINUM: No acute abnormality of the cardiac and mediastinal silhouettes. Thoracic aortic atherosclerosis. BONES AND SOFT TISSUES: No acute osseous abnormality. IMPRESSION: 1. No acute findings. Electronically signed by: Zadie Herter MD 12/17/2023 07:08 PM EDT RP Workstation: [id number]    EKG: My personal interpretation of EKG shows: Sinus tachycardia at 110, no ST elevation noted.    Assessment/Plan Principal Problem:   Sepsis (HCC) Active Problems:   NSTEMI (non-ST elevated myocardial infarction) [facility])   Essential hypertension   Coronary artery disease   Syncopal episodes   Hyponatremia      This is a 89 year old female who has a past medical history of coronary artery disease s/p stent in 2022, HTN, history of urinary tract infection who was brought in for repeated episodes of syncopal events at home.  1.  Multiple syncopal episode of unknown etiology - May be related to sepsis due to UTI - She will be monitored in telemetry - She will be treated for underlying etiology likely due to sepsis.  2.   Urinary tract infection, failed outpatient treatment - She was given Ceftin at home. - She has been started on ceftriaxone . - Will follow the cultures. - Patient was eval by urologist and advised for antibiotics.  Follow their recommendation.  3.  Non-ST elevation myocardial infarction/demand ischemia - She has elevated troponin, 174 and 571 - It could be related to sepsis or to coronary artery disease in the setting of history of MI and stent placement - She will be continued on aspirin , Plavix , nitroglycerin , metoprolol , isosorbide  mononitrate - Per Dr. Nolan Battle recommendation, patient will be started on heparin  drip and formal consultation will be done in the morning. - Patient does not have any symptoms.  4.  HTN - Blood pressure is high - Will continue to monitor - Will continue oral amlodipine  and metoprolol .   DVT prophylaxis: IV heparin  gtts Code Status: Full Code Family Communication: Patient's daughter Devra Fontana Disposition Plan: Home Consults called: Urology/cardiology from ED Admission status: Inpatient, Telemetry bed   Beatris Bough, MD [facility] 12/17/2023, 10:45 PM

## 2023-12-17 NOTE — ED Notes (Addendum)
 Critical Lab result troponin 174 Dr. Valetta Gaudy made aware no new orders

## 2023-12-17 NOTE — Progress Notes (Signed)
    Asked by Dr. Valetta Gaudy to review CT on patient.  Presented with 3-day history of intermittent syncopal episodes.  And most likely has NSTEMI.  Incidentally noted on CT to have moderate right hydronephrosis with some parenchymal atrophy and probable UPJ obstruction.  No complaints of leg pain, frequency, urgency or dysuria.  No leukocytosis.  Did have temp 100.6 in the ED.  Lactate was normal.  UA >50 WBC with no significant squamous epis.  CT personally reviewed.  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  No evidence of urinary tract stone.  There is a proximal uptake in the renal parenchymal bilaterally without delay on the right.  The bladder incidentally noted to be distended.  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  Feel the risks of ureteral stenting under anesthesia would outweigh benefits and patient with NSTEMI especially without definite evidence of obstruction or sepsis.  If she deteriorated clinically and needed drainage feel PCN would be a safer approach Prior to any drainage procedure would consider a Lasix renogram to determine the degree of obstruction if any Her bladder is distended and if chronic may be contributing to her pyuria.  Consider Foley catheter placement or at minimum checking a postvoid residual by bladder scan

## 2023-12-17 NOTE — ED Notes (Signed)
 This NT assisted pt with getting on bed pan, changing brief, and changing linens. Pt is now resting comfortably in bed.

## 2023-12-18 ENCOUNTER — Other Ambulatory Visit: Payer: Self-pay | Admitting: Radiology

## 2023-12-18 ENCOUNTER — Inpatient Hospital Stay

## 2023-12-18 DIAGNOSIS — A4151 Sepsis due to Escherichia coli [E. coli]: Secondary | ICD-10-CM

## 2023-12-18 DIAGNOSIS — E876 Hypokalemia: Secondary | ICD-10-CM | POA: Insufficient documentation

## 2023-12-18 DIAGNOSIS — A419 Sepsis, unspecified organism: Secondary | ICD-10-CM

## 2023-12-18 DIAGNOSIS — E871 Hypo-osmolality and hyponatremia: Secondary | ICD-10-CM

## 2023-12-18 DIAGNOSIS — I214 Non-ST elevation (NSTEMI) myocardial infarction: Secondary | ICD-10-CM

## 2023-12-18 DIAGNOSIS — R55 Syncope and collapse: Secondary | ICD-10-CM

## 2023-12-18 DIAGNOSIS — N1330 Unspecified hydronephrosis: Secondary | ICD-10-CM

## 2023-12-18 DIAGNOSIS — I10 Essential (primary) hypertension: Secondary | ICD-10-CM | POA: Diagnosis not present

## 2023-12-18 HISTORY — DX: Sepsis due to Escherichia coli (e. coli): A41.51

## 2023-12-18 LAB — URINE CULTURE: Culture: NO GROWTH

## 2023-12-18 LAB — BLOOD CULTURE ID PANEL (REFLEXED) - BCID2

## 2023-12-18 LAB — COMPREHENSIVE METABOLIC PANEL WITH GFR
ALT: 16 U/L (ref 0–44)
AST: 46 U/L — ABNORMAL HIGH (ref 15–41)
Albumin: 3.2 g/dL — ABNORMAL LOW (ref 3.5–5.0)
Alkaline Phosphatase: 48 U/L (ref 38–126)
Anion gap: 10 (ref 5–15)
BUN: 14 mg/dL (ref 8–23)
CO2: 28 mmol/L (ref 22–32)
Calcium: 8.9 mg/dL (ref 8.9–10.3)
Chloride: 94 mmol/L — ABNORMAL LOW (ref 98–111)
Creatinine, Ser: 0.5 mg/dL (ref 0.44–1.00)
GFR, Estimated: >60 mL/min (ref >60)
Glucose, Bld: 123 mg/dL — ABNORMAL HIGH (ref 70–99)
Potassium: 3 mmol/L — ABNORMAL LOW (ref 3.5–5.1)
Sodium: 132 mmol/L — ABNORMAL LOW (ref 135–145)
Total Bilirubin: 0.8 mg/dL (ref 0.0–1.2)
Total Protein: 6.3 g/dL — ABNORMAL LOW (ref 6.5–8.1)

## 2023-12-18 LAB — PROTIME-INR
INR: 1.2 (ref 0.8–1.2)
Prothrombin Time: 15.1 s (ref 11.4–15.2)

## 2023-12-18 LAB — CBC
HCT: 36.3 % (ref 36.0–46.0)
Hemoglobin: 12.2 g/dL (ref 12.0–15.0)
MCH: 30.7 pg (ref 26.0–34.0)
MCHC: 33.6 g/dL (ref 30.0–36.0)
MCV: 91.4 fL (ref 80.0–100.0)
Platelets: 168 10*3/uL (ref 150–400)
RBC: 3.97 MIL/uL (ref 3.87–5.11)
RDW: 14.5 % (ref 11.5–15.5)
WBC: 7.7 10*3/uL (ref 4.0–10.5)
nRBC: 0 % (ref 0.0–0.2)

## 2023-12-18 LAB — TROPONIN I (HIGH SENSITIVITY): Troponin I (High Sensitivity): 4599 ng/L (ref <18)

## 2023-12-18 LAB — HEPARIN LEVEL (UNFRACTIONATED)
Heparin Unfractionated: 0.11 [IU]/mL — ABNORMAL LOW (ref 0.30–0.70)
Heparin Unfractionated: 0.17 [IU]/mL — ABNORMAL LOW (ref 0.30–0.70)

## 2023-12-18 MED ORDER — HEPARIN BOLUS VIA INFUSION
1750.0000 [IU] | Freq: Once | INTRAVENOUS | Status: AC
  Administered 2023-12-18: 1750 [IU] via INTRAVENOUS
  Filled 2023-12-18: qty 1750

## 2023-12-18 MED ORDER — FUROSEMIDE 10 MG/ML IJ SOLN
29.7000 mg | INTRAMUSCULAR | Status: AC
  Administered 2023-12-18: 30 mg via INTRAVENOUS
  Filled 2023-12-18 (×2): qty 4

## 2023-12-18 MED ORDER — POTASSIUM CHLORIDE CRYS ER 20 MEQ PO TBCR
40.0000 meq | EXTENDED_RELEASE_TABLET | Freq: Once | ORAL | Status: AC
  Administered 2023-12-18: 40 meq via ORAL
  Filled 2023-12-18: qty 2

## 2023-12-18 MED ORDER — SODIUM CHLORIDE 0.9 % IV SOLN: 1.0000 g | INTRAVENOUS | Status: DC

## 2023-12-18 MED ORDER — TECHNETIUM TC 99M MERTIATIDE
4.9900 | Freq: Once | INTRAVENOUS | Status: AC | PRN
  Administered 2023-12-18: 4.99 via INTRAVENOUS

## 2023-12-18 MED ORDER — SODIUM CHLORIDE 0.9 % IV SOLN
2.0000 g | INTRAVENOUS | Status: DC
  Administered 2023-12-18 – 2023-12-19 (×2): 2 g via INTRAVENOUS
  Filled 2023-12-18 (×2): qty 20

## 2023-12-18 MED ORDER — POTASSIUM CHLORIDE CRYS ER 20 MEQ PO TBCR: 40.0000 meq | EXTENDED_RELEASE_TABLET | Freq: Once | ORAL | Status: DC

## 2023-12-18 NOTE — Progress Notes (Signed)
 Progress Note   Patient: Alexis Perez WUJ:[id number] DOB: [birth year] DOA: 12/17/2023     1 DOS: the patient was seen and examined on 12/18/2023   Brief hospital course: 89 y.o. female with medical history significant for CAD s/p stent in 2022, HTN, dementia, history of urinary tract infection, HLD, history of hyponatremia who was recently diagnosed having UTI and was given oral cephalosporin antibiotic for UTI.  She received 1 dose but she continued to have brief syncopal episode at home.  Patient's daughter Alexis Perez who was concerned and brought in to emergency room for evaluation.  As per the patient's daughter patient had 2 or 3 episodes of very brief passing out event but she would be normal after that.  There was no post syncopal confusion.  There was no fall or loss of urine or bladder.  The patient had a fever but they did not check the temperature at home.  Patient does not have nausea vomiting abdominal pain chest pain shortness of breath palpitations.  She denies hitting her head or any trauma.   ED Course: Upon arrival to the ED, patient is found to have elevated troponin and positive urinary tract infection.  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  Urologist evaluated the patient and advised for treating UTI at this point.  Cardiologist Dr. Nolan Battle was contacted by ED for elevated troponin.  Dr. Nolan Battle advised to start on heparin  drip and treat medically at this point.  Hospitalist service was consulted for evaluation for admission for possible sepsis  6/18.  Blood cultures positive for E. coli.  Patient not having any chest pain or shortness of breath.  On empiric heparin  drip for NSTEMI.  Increase Rocephin  up to 2 g daily.  Assessment and Plan: * Sepsis due to Escherichia coli (E. coli) (HCC) Present on admission.  Increase Rocephin  to 2 g daily.  Follow-up sensitivities of blood cultures.  Patient did have fever and tachycardia which meets SIRS criteria.   Source is urinary infection and acute cystitis with hematuria.  NSTEMI (non-ST elevated myocardial infarction) (HCC) Conservative management with heparin  drip, aspirin , Plavix , Imdur , metoprolol .  Echocardiogram ordered.  Hydronephrosis, right Case discussed with urology since the patient does have positive blood cultures with sepsis he recommended getting a nuclear medicine Lasix renal scan.  Essential hypertension Continue metoprolol   Hypokalemia Replace orally  Hyponatremia Sodium 132.  Continue to monitor  Syncopal episodes Likely secondary to sepsis        Subjective: Patient feeling little bit better.  Admitted with non-STEMI, urinary tract infection found to be bacteremic with E. coli.  No complaints of chest pain or shortness of breath today.  Started on heparin  drip yesterday.  Physical Exam: Vitals:   12/17/23 2244 12/17/23 2314 12/18/23 0910 12/18/23 1135  BP: (!) 127/56 118/71 (!) 151/65 (!) 146/72  Pulse: (!) 105 (!) 104 (!) 104 (!) 107  Resp: 20 20 19 18   Temp: 99 F (37.2 C) 98.4 F (36.9 C) 99.7 F (37.6 C)   TempSrc: Oral  Oral   SpO2: 92% 95% 97% 95%  Weight:      Height:       Physical Exam HENT:     Head: Normocephalic.     Mouth/Throat:     Pharynx: No oropharyngeal exudate.   Eyes:     General: Lids are normal.     Conjunctiva/sclera: Conjunctivae normal.    Cardiovascular:     Rate and Rhythm: Normal rate and regular  rhythm.     Heart sounds: Normal heart sounds, S1 normal and S2 normal.  Pulmonary:     Breath sounds: No decreased breath sounds, wheezing, rhonchi or rales.  Abdominal:     Palpations: Abdomen is soft.     Tenderness: There is no abdominal tenderness.   Musculoskeletal:     Right lower leg: No swelling.     Left lower leg: No swelling.   Skin:    General: Skin is warm.     Findings: No rash.   Neurological:     Mental Status: She is alert and oriented to person, place, and time.     Data  Reviewed: Troponin up to 4599, sodium 132, potassium 3.0, creatinine 0.5, CBC normal range.  Family Communication: Spoke with patient's daughter at the bedside  Disposition: Status is: Inpatient Remains inpatient appropriate because: IV antibiotics for sepsis E. coli in the blood culture.  Heparin  drip for NSTEMI.  Nuclear medicine renal Lasix scan as per urology.  Planned Discharge Destination: Home    Time spent: 28 minutes  Author: Verla Glaze, MD 12/18/2023 1:42 PM  For on call review [email or web address].

## 2023-12-18 NOTE — Progress Notes (Signed)
 PHARMACY - ANTICOAGULATION CONSULT NOTE  Pharmacy Consult for Heparin   Indication: chest pain/ACS  Allergies  Allergen Reactions   Fentanyl Nausea And Vomiting   Sodium Pentobarbital [Pentobarbital] Anaphylaxis    When pt was sedated for surgery   Hydrochlorothiazide Other (See Comments)    hyponatremia   Ciprofloxacin      Blurry vision, sleepy and make pt sick   Codeine Other (See Comments)    Other reaction(s): UNKNOWN   Penicillin G Rash   Sulfa Antibiotics Rash   Patient Measurements: Height: 5' (152.4 cm) Weight: 59.4 kg (131 lb) IBW/kg (Calculated) : 45.5 HEPARIN  DW (KG): 57.6  Vital Signs: Temp: 99.7 F (37.6 C) (06/18 0910) Temp Source: Oral (06/18 0910) BP: 113/58 (06/18 1551) Pulse Rate: 117 (06/18 1551)  Labs: Recent Labs    12/17/23 1813 12/17/23 2009 12/17/23 2314 12/18/23 0055 12/18/23 0706 12/18/23 1656  HGB 11.7*  --   --   --  12.2  --   HCT 35.0*  --   --   --  36.3  --   PLT 187  --   --   --  168  --   LABPROT 13.9  --   --   --  15.1  --   INR 1.1  --   --   --  1.2  --   HEPARINUNFRC  --   --   --   --  0.11* 0.17*  CREATININE 0.56  --   --   --  0.50  --   TROPONINIHS 174* 571* 2,751* 4,599*  --   --    Estimated Creatinine Clearance: 31.7 mL/min (by C-G formula based on SCr of 0.5 mg/dL).  Medical History: Past Medical History:  Diagnosis Date   Hypertension    Medications:  Medications Prior to Admission  Medication Sig Dispense Refill Last Dose/Taking   amLODipine  (NORVASC ) 5 MG tablet Take 5 mg by mouth daily.   12/17/2023 Noon   aspirin  EC 81 MG tablet Take 81 mg by mouth daily. Swallow whole.   12/17/2023   cefdinir (OMNICEF) 300 MG capsule Take 300 mg by mouth 2 (two) times daily.   12/17/2023 Morning   Cholecalciferol  50 MCG (2000 UT) CAPS Take 1 capsule by mouth daily.   12/17/2023 Morning   ezetimibe  (ZETIA ) 10 MG tablet Take 10 mg by mouth at bedtime.   12/16/2023 at  8:00 PM   isosorbide  mononitrate (IMDUR ) 30 MG 24 hr  tablet Take 120 mg by mouth daily.   12/17/2023 Morning   metoprolol  succinate (TOPROL -XL) 50 MG 24 hr tablet Take 100 mg by mouth at bedtime.   12/16/2023 at  8:00 PM   Multiple Vitamins-Minerals (PRESERVISION AREDS 2) CAPS Take 1 capsule by mouth daily.   12/17/2023 Morning   nitroGLYCERIN  (NITROSTAT ) 0.4 MG SL tablet Place under the tongue.   Taking   rosuvastatin  (CRESTOR ) 10 MG tablet Take 10 mg by mouth once a week.   Past Week   Calcium -Vitamin D -Vitamin K 500-100-40 MG-UNT-MCG CHEW Chew 1 tablet by mouth daily. (Patient not taking: Reported on 12/17/2023)   Not Taking   clopidogrel  (PLAVIX ) 75 MG tablet Take 75 mg by mouth daily. (Patient not taking: Reported on 12/17/2023)   Not Taking   Assessment: Pharmacy consulted to dose heparin  in this 89 year old female admitted with ACS/NSTEMI.  No prior anticoag noted.CrCl = 31.7 ml/min   Goal of Therapy:  Heparin  level 0.3-0.7 units/ml Monitor platelets by anticoagulation protocol: Yes  Labs:  0618 0706 HL 0.11; subtherapeutic on 750 units/hr  0618 1656 HL 0.17; subtherapeutic on 950 units/hr   Plan:  HL subtherapeutic  Give 1750 unit bolus x 1 and increase drip to 1150 units/hr  Will recheck HL 8 hours after rate adjustment  CBC stable; monitor daily for s/sx of bleeding   Inga Noller A Caeson Filippi, PharmD Clinical Pharmacist 12/18/2023 5:33 PM

## 2023-12-18 NOTE — Consult Note (Signed)
 Cardiology Consultation   Patient ID: Alexis Perez MRN: [id number]; DOB: [birth year]  Admit date: 12/17/2023 Date of Consult: 12/18/2023  PCP:  Edelmira Goodness, MD   [facility] Providers Cardiologist: [facility]Elyss[facility] Strausser  Patient Profile: Alexis Perez is a 89 y.o. female with a hx of coronary artery disease, occluded RCA, prior stenting 2023 to left circumflex, severe residual mid LAD disease, dementia, hyponatremia, urinary tract infections presenting with encephalopathy, mental status changes, brief syncopal episode.  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 Coli started on Rocephin  - Troponin greater than 4000  On arrival reported some tightness in the chest, felt that her bra was too tight, discomfort in her back, had improvement by taking off her undergarments Currently asymptomatic She denies significant chest pain concerning for angina Plan for nuclear medicine Lasix renal scan per urology  Initial EKG on arrival with ST depressions anterolateral leads  Past Medical History:  Diagnosis Date   Hypertension     Past Surgical History:  Procedure Laterality Date   ANKLE FRACTURE SURGERY Left 1999     Home Medications:  Prior to Admission medications   Medication Sig Start Date End Date Taking? Authorizing Provider  amLODipine  (NORVASC ) 5 MG tablet Take 5 mg by mouth daily. 02/14/22  Yes [provider]  aspirin  EC 81 MG tablet Take 81 mg by mouth daily. Swallow whole.   Yes [provider]  cefdinir (OMNICEF) 300 MG capsule Take 300 mg  by mouth 2 (two) times daily. 12/17/23 12/24/23 Yes [provider]  Cholecalciferol  50 MCG (2000 UT) CAPS Take 1 capsule by mouth daily.   Yes [provider]  ezetimibe  (ZETIA ) 10 MG tablet Take 10 mg by mouth at bedtime. 07/22/21  Yes [provider]  isosorbide  mononitrate (IMDUR ) 30 MG 24 hr tablet Take 120 mg by mouth daily. 08/20/21  Yes [provider]  metoprolol  succinate (TOPROL -XL) 50 MG 24 hr tablet Take 100 mg by mouth at bedtime. 08/09/21  Yes [provider]  Multiple Vitamins-Minerals (PRESERVISION AREDS 2) CAPS Take 1 capsule by mouth daily. 03/19/19  Yes [provider]  nitroGLYCERIN  (NITROSTAT ) 0.4 MG SL tablet Place under the tongue. 07/07/21  Yes [provider]  rosuvastatin  (CRESTOR ) 10 MG tablet Take 10 mg by mouth once a week. 10/04/21  Yes [provider]  Calcium -Vitamin D -Vitamin K 500-100-40 MG-UNT-MCG CHEW Chew 1 tablet by mouth daily. Patient not taking: Reported on 12/17/2023    [provider]  clopidogrel  (PLAVIX ) 75 MG tablet Take 75 mg by mouth daily. Patient not taking: Reported on 12/17/2023 07/22/21   [provider]    Scheduled Meds:  amLODipine   5 mg Oral Daily   aspirin  EC  81 mg Oral Daily   clopidogrel   75 mg Oral Daily   ezetimibe   10 mg Oral QHS   furosemide  30 mg Intravenous STAT   isosorbide  mononitrate  120 mg Oral Daily   metoprolol  succinate  100 mg Oral QHS   Continuous Infusions:  cefTRIAXone  (ROCEPHIN )  IV     heparin  950 Units/hr (12/18/23 0849)   PRN Meds: acetaminophen  **OR** acetaminophen , HYDROcodone-acetaminophen , HYDROmorphone (DILAUDID) injection, nitroGLYCERIN , ondansetron  **  OR** ondansetron  (ZOFRAN ) IV, polyethylene glycol  Allergies:    Allergies  Allergen Reactions   Fentanyl Nausea And Vomiting   Sodium Pentobarbital [Pentobarbital] Anaphylaxis    When pt was sedated for surgery   Hydrochlorothiazide Other (See Comments)     hyponatremia   Ciprofloxacin      Blurry vision, sleepy and make pt sick   Codeine Other (See Comments)    Other reaction(s): UNKNOWN   Penicillin G Rash   Sulfa Antibiotics Rash    Social History:   Social History   Socioeconomic History   Marital status: Widowed    Spouse name: Not on file   Number of children: Not on file   Years of education: Not on file   Highest education level: Not on file  Occupational History   Not on file  Tobacco Use   Smoking status: Never   Smokeless tobacco: Never  Vaping Use   Vaping status: Never Used  Substance and Sexual Activity   Alcohol  use: Never   Drug use: Never   Sexual activity: Not on file  Other Topics Concern   Not on file  Social History Narrative   Not on file   Social Drivers of Health   Financial Resource Strain: Low Risk  (12/17/2023)   Received from [facility]   Overall Financial Resource Strain (CARDIA)    Difficulty of Paying Living Expenses: Not hard at all  Food Insecurity: No Food Insecurity (12/18/2023)   Hunger Vital Sign    Worried About Running Out of Food in the Last Year: Never true    Ran Out of Food in the Last Year: Never true  Transportation Needs: No Transportation Needs (12/18/2023)   PRAPARE - Administrator, Civil Service (Medical): No    Lack of Transportation (Non-Medical): No  Physical Activity: Sufficiently Active (03/17/2018)   Received from [facility]   Exercise Vital Sign    Days of Exercise per Week: 7 days    Minutes of Exercise per Session: 30 min  Stress: No Stress Concern Present (03/17/2018)   Received from [facility] Occup[facility] - Occupational Stress Questionnaire    Feeling of Stress : Only a little  Social Connections: Unknown (03/17/2018)   Received from [facility]   Social Connection and Isolation Panel    Frequency of Communication with Friends and Family: Not on file    Frequency of Social  Gatherings with Friends and Family: Not on file    Attends Religious Services: Not on file    Active Member of Clubs or Organizations: Not on file    Attends Banker Meetings: Not on file    Marital Status: Widowed  Intimate Partner Violence: Not At Risk (12/17/2023)   Humiliation, Afraid, Rape, and Kick questionnaire    Fear of Current or Ex-Partner: No    Emotionally Abused: No    Physically Abused: No    Sexually Abused: No    Family History:   History reviewed. No pertinent family history.   ROS:  Please see the history of present illness.  Review of Systems  Constitutional: Negative.   HENT: Negative.    Respiratory: Negative.    Cardiovascular: Negative.   Gastrointestinal: Negative.   Musculoskeletal: Negative.   Neurological: Negative.   Psychiatric/Behavioral: Negative.    All other systems reviewed and are negative.   Physical Exam/Data: Vitals:   12/17/23 2244 12/17/23 2314 12/18/23 0910 12/18/23 1135  BP: (!) 127/56 118/71 (!) 151/65 (!) 146/72  Pulse: (!) 105 (!) 104 (!) 104 (!) 107  Resp: 20 20 19 18   Temp: 99 F (37.2 C) 98.4 F (36.9 C) 99.7 F (37.6 C)   TempSrc: Oral  Oral   SpO2: 92% 95% 97% 95%  Weight:      Height:        Intake/Output Summary (Last 24 hours) at 12/18/2023 1418 Last data filed at 12/18/2023 1045 Gross per 24 hour  Intake 100 ml  Output 1550 ml  Net -1450 ml      12/17/2023   10:16 PM 10/11/2021    2:10 AM 10/03/2021   12:27 AM  Last 3 Weights  Weight (lbs) 131 lb 132 lb 132 lb  Weight (kg) 59.421 kg 59.875 kg 59.875 kg     Body mass index is 25.58 kg/m.  General:  Well nourished, well developed, in no acute distress HEENT: normal Neck: no JVD Vascular: No carotid bruits; Distal pulses 2+ bilaterally Cardiac:  normal S1, S2; RRR; no murmur  Lungs:  clear to auscultation bilaterally, no wheezing, rhonchi or rales  Abd: soft, nontender, no hepatomegaly  Ext: no edema Musculoskeletal:  No deformities, BUE  and BLE strength normal and equal Skin: warm and dry  Neuro:  CNs 2-12 intact, no focal abnormalities noted Psych:  Normal affect   EKG:  The EKG was personally reviewed and demonstrates: Sinus tachycardia rate 107 bpm ST depressions V4 through V6 2 3 aVF, mild ST elevation V1, V2, aVR  Telemetry:  Telemetry was personally reviewed and demonstrates: Normal sinus rhythm  Relevant CV Studies: Echo pending  Laboratory Data: High Sensitivity Troponin:   Recent Labs  Lab 12/17/23 1813 12/17/23 2009 12/17/23 2314 12/18/23 0055  TROPONINIHS 174* 571* 2,751* 4,599*     Chemistry Recent Labs  Lab 12/17/23 1813 12/18/23 0706  NA 128* 132*  K 3.6 3.0*  CL 92* 94*  CO2 25 28  GLUCOSE 123* 123*  BUN 22 14  CREATININE 0.56 0.50  CALCIUM  8.9 8.9  GFRNONAA >60 >60  ANIONGAP 11 10    Recent Labs  Lab 12/17/23 1813 12/18/23 0706  PROT 6.7 6.3*  ALBUMIN 3.4* 3.2*  AST 22 46*  ALT 13 16  ALKPHOS 51 48  BILITOT 1.1 0.8   Lipids No results for input(s): CHOL, TRIG, HDL, LABVLDL, LDLCALC, CHOLHDL in the last 168 hours.  Hematology Recent Labs  Lab 12/17/23 1813 12/18/23 0706  WBC 10.2 7.7  RBC 3.80* 3.97  HGB 11.7* 12.2  HCT 35.0* 36.3  MCV 92.1 91.4  MCH 30.8 30.7  MCHC 33.4 33.6  RDW 14.5 14.5  PLT 187 168   Thyroid No results for input(s): TSH, FREET4 in the last 168 hours.  BNPNo results for input(s): BNP, PROBNP in the last 168 hours.  DDimer No results for input(s): DDIMER in the last 168 hours.  Radiology/Studies:  CT Angio Chest/Abd/Pel for Dissection W and/or Wo Contrast Result Date: 12/17/2023 EXAM: CTA CHEST, ABDOMEN AND PELVIS WITHOUT AND WITH CONTRAST 12/17/2023 07:45:54 PM TECHNIQUE: CTA of the chest was performed without and with the administration of intravenous contrast. CTA of the abdomen and pelvis was performed without and with the administration of intravenous contrast. Multiplanar reformatted images are provided for review.  MIP images are provided for review. Automated exposure control, iterative reconstruction, and/or weight based adjustment of the mA/kV was utilized to reduce the radiation dose to as low as reasonably achievable. COMPARISON: None available. CLINICAL HISTORY:  Aortic aneurysm suspected. Pt BIB [facility] from home with reports of syncope, recent dx of UTI yesterday, and hyponatremia. Denies pain. FINDINGS: VASCULATURE: AORTA: No evidence of thoracic aortic aneurysm or dissection. Atherosclerotic calcification of the aortic arch. No evidence of abdominal aortic aneurysm or dissection. Atherosclerotic calcifications. PULMONARY ARTERIES: Although not tailored for evaluation of the pulmonary arteries, there is no evidence of pulmonary embolism. GREAT VESSELS OF AORTIC ARCH: No acute finding. No dissection. No arterial occlusion or significant stenosis. CELIAC TRUNK: The celiac artery is patent, with atherosclerotic calcification. SUPERIOR MESENTERIC ARTERY: The SMA is patent, with atherosclerotic calcification. INFERIOR MESENTERIC ARTERY: No acute finding. No occlusion or significant stenosis. RENAL ARTERIES: Bilateral renal arteries are patent, with atherosclerotic calcifications at the origin. ILIAC ARTERIES: Atherosclerotic calcification of the bilateral iliac arteries, although patent. CHEST: MEDIASTINUM: No mediastinal lymphadenopathy. The heart and pericardium demonstrate no acute abnormality. Severe 3-vessel coronary atherosclerosis. Dominant 3.4 cm left thyroid nodule (image 29). Given the patient's age, no follow-up is recommended. LUNGS AND PLEURA: Mild subpleural reticulation/fibrosis in the lungs bilaterally, lower lobe predominant suggesting mild chronic interstitial lung disease or post-infectious inflammatory scarring. Mild interlobular septal thickening in the bilateral upper lobes (for example, image 56) at least raising the possibility of mild interstitial edema. Scattered small bilateral pulmonary nodules  measuring up to 4 mm likely benign. Given the patient's age, no dedicated follow-up imaging is suggested. No evidence of pleural effusion or pneumothorax. THORACIC BONES AND SOFT TISSUES: No acute findings. ABDOMEN AND PELVIS: LIVER: Scattered hepatic cysts measuring up to 6.5 cm in segment 4, benign. GALLBLADDER AND BILE DUCTS: Gallbladder is unremarkable. No biliary ductal dilatation. SPLEEN: The spleen is unremarkable. PANCREAS: The pancreas is unremarkable. ADRENAL GLANDS: Bilateral adrenal glands demonstrate no acute abnormality. KIDNEYS, URETERS AND BLADDER: Simple bilateral renal cysts are present measuring up to 2.7 cm (image 179), benign Bosniak 1. No follow-up is recommended. 2.2 cm hyperdense lesion in the medial right lower kidney (image 199), indeterminate. Given the patient's age, no follow-up is recommended. 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 Very mild soft tissue prominence at the right UVJ is also possible (image 268). Unclear that urologic evaluation is beneficial in this patient. No stones in the kidneys or ureters. No perinephric or periureteral stranding. Urinary bladder is unremarkable. GI AND BOWEL: The appendix is not visualized. There is moderate right colonic stool burden, suggesting mild constipation. Stomach and duodenal sweep demonstrate no acute abnormality. There is no bowel obstruction. No abnormal bowel wall thickening or distension. REPRODUCTIVE: Reproductive organs are unremarkable. PERITONEUM AND RETROPERITONEUM: No ascites or free air. LYMPH NODES: No lymphadenopathy. ABDOMINAL BONES AND SOFT TISSUES: Mild degenerative changes of the lumbar spine. No acute soft tissue abnormality. IMPRESSION: 1. No thoracoabdominal aortic aneurysm or dissection. 2. 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 Urology consultation can be considered but unclear that aggressive evaluation is appropriate. 3. Possible mild interstitial edema, equivocal. No pleural effusion. 4. Additional ancillary findings as above. No follow-up is recommended given the patient's age. Electronically signed by: Zadie Herter MD 12/17/2023 08:56 PM EDT RP Workstation: [id number]   CT Head Wo Contrast Result Date: 12/17/2023 CLINICAL DATA:  Delirium EXAM: CT HEAD WITHOUT CONTRAST TECHNIQUE: Contiguous axial images were obtained from the base of the skull through the vertex without intravenous contrast. RADIATION DOSE REDUCTION: This exam was performed according to the departmental dose-optimization program  which includes automated exposure control, adjustment of the mA and/or kV according to patient size and/or use of iterative reconstruction technique. COMPARISON:  None Available. FINDINGS: Brain: Hypodensities throughout the periventricular white matter and basal ganglia consistent with chronic small vessel ischemic changes. No evidence of acute infarct or hemorrhage. Lateral ventricles and remaining midline structures are unremarkable. No acute extra-axial fluid collections. No mass effect. Vascular: No hyperdense vessel or unexpected calcification. Skull: Normal. Negative for fracture or focal lesion. Sinuses/Orbits: Polypoid mucosal thickening of the maxillary and frontal sinuses. Other: None. IMPRESSION: 1. No acute intracranial process. Electronically Signed   By: Bobbye Burrow M.D.   On: 12/17/2023 20:02   DG Chest Port 1 View Result Date: 12/17/2023 EXAM: 1 VIEW XRAY OF THE CHEST 12/17/2023 06:58:00 PM COMPARISON: 10/11/2021 CLINICAL HISTORY: Questionable sepsis - evaluate for abnormality. Pt BIB [facility] from home with reports of syncope, recent dx of UTI yesterday, and hyponatremia. Denies pain. Pt received 500cc NacL en route. FINDINGS: LUNGS AND PLEURA: Increased interstitial markings, chronic,  without frank interstitial edema. No focal pulmonary opacity. No pleural effusion. No pneumothorax. HEART AND MEDIASTINUM: No acute abnormality of the cardiac and mediastinal silhouettes. Thoracic aortic atherosclerosis. BONES AND SOFT TISSUES: No acute osseous abnormality. IMPRESSION: 1. No acute findings. Electronically signed by: Zadie Herter MD 12/17/2023 07:08 PM EDT RP Workstation: [id number]     Assessment and Plan: Non-ST elevation MI Presenting with encephalopathy, UTI, denies chest pain concerning for angina Troponin greater than 4000, markedly abnormal EKG on arrival consistent with ischemia -Known disease with occluded RCA, prior stenting to left circumflex, residual 80% mid LAD disease - On heparin  infusion, currently asymptomatic - Discussed various treatment options with family at the bedside - Given lack of symptoms, age 73, in the setting of bacteremia/sepsis, recommend repeat EKG, echocardiogram, continue heparin  infusion with no immediate plans for cardiac catheterization - On aspirin , Plavix , Imdur , metoprolol   E. coli bacteremia/sepsis UTI, followed by urology  Hyponatremia Sodium 132, will follow  Hypokalemia Repleted   For questions or updates, please contact [facility] Please consult [email or web address] for contact info under    Signed, Maylin Freeburg, MD  12/18/2023 2:18 PM

## 2023-12-18 NOTE — Progress Notes (Signed)
 PHARMACY - ANTICOAGULATION CONSULT NOTE  Pharmacy Consult for Heparin   Indication: chest pain/ACS  Allergies  Allergen Reactions   Fentanyl Nausea And Vomiting   Sodium Pentobarbital [Pentobarbital] Anaphylaxis    When pt was sedated for surgery   Hydrochlorothiazide Other (See Comments)    hyponatremia   Ciprofloxacin      Blurry vision, sleepy and make pt sick   Codeine Other (See Comments)    Other reaction(s): UNKNOWN   Penicillin G Rash   Sulfa Antibiotics Rash   Patient Measurements: Height: 5' (152.4 cm) Weight: 59.4 kg (131 lb) IBW/kg (Calculated) : 45.5 HEPARIN  DW (KG): 57.6  Vital Signs: Temp: 98.4 F (36.9 C) (06/17 2314) Temp Source: Oral (06/17 2244) BP: 118/71 (06/17 2314) Pulse Rate: 104 (06/17 2314)  Labs: Recent Labs    12/17/23 1813 12/17/23 2009 12/17/23 2314 12/18/23 0055 12/18/23 0706  HGB 11.7*  --   --   --  12.2  HCT 35.0*  --   --   --  36.3  PLT 187  --   --   --  168  LABPROT 13.9  --   --   --  15.1  INR 1.1  --   --   --  1.2  HEPARINUNFRC  --   --   --   --  0.11*  CREATININE 0.56  --   --   --  0.50  TROPONINIHS 174* 571* 2,751* 4,599*  --    Estimated Creatinine Clearance: 31.7 mL/min (by C-G formula based on SCr of 0.5 mg/dL).  Medical History: Past Medical History:  Diagnosis Date   Hypertension    Medications:  Medications Prior to Admission  Medication Sig Dispense Refill Last Dose/Taking   amLODipine  (NORVASC ) 5 MG tablet Take 5 mg by mouth daily.   12/17/2023 Noon   aspirin  EC 81 MG tablet Take 81 mg by mouth daily. Swallow whole.   12/17/2023   cefdinir (OMNICEF) 300 MG capsule Take 300 mg by mouth 2 (two) times daily.   12/17/2023 Morning   Cholecalciferol  50 MCG (2000 UT) CAPS Take 1 capsule by mouth daily.   12/17/2023 Morning   ezetimibe  (ZETIA ) 10 MG tablet Take 10 mg by mouth at bedtime.   12/16/2023 at  8:00 PM   isosorbide  mononitrate (IMDUR ) 30 MG 24 hr tablet Take 120 mg by mouth daily.   12/17/2023 Morning    metoprolol  succinate (TOPROL -XL) 50 MG 24 hr tablet Take 100 mg by mouth at bedtime.   12/16/2023 at  8:00 PM   Multiple Vitamins-Minerals (PRESERVISION AREDS 2) CAPS Take 1 capsule by mouth daily.   12/17/2023 Morning   nitroGLYCERIN  (NITROSTAT ) 0.4 MG SL tablet Place under the tongue.   Taking   rosuvastatin  (CRESTOR ) 10 MG tablet Take 10 mg by mouth once a week.   Past Week   Calcium -Vitamin D -Vitamin K 500-100-40 MG-UNT-MCG CHEW Chew 1 tablet by mouth daily. (Patient not taking: Reported on 12/17/2023)   Not Taking   clopidogrel  (PLAVIX ) 75 MG tablet Take 75 mg by mouth daily. (Patient not taking: Reported on 12/17/2023)   Not Taking   Assessment: Pharmacy consulted to dose heparin  in this 89 year old female admitted with ACS/NSTEMI.  No prior anticoag noted.CrCl = 31.7 ml/min   Goal of Therapy:  Heparin  level 0.3-0.7 units/ml Monitor platelets by anticoagulation protocol: Yes  Labs:  0618 0706 HL 0.11; subtherapeutic on 750 units/hr    Plan:  HL subtherapeutic  Give 1750 unit bolus x 1 and increase  drip to 950 units/hr  Will recheck HL 8 hours after rate adjustment  CBC stable; monitor daily for s/sx of bleeding   Pansy Bogus, PharmD Pharmacy Resident  12/18/2023 8:16 AM

## 2023-12-18 NOTE — Hospital Course (Addendum)
 89 y.o. female with medical history significant for CAD s/p stent in 2022, HTN, dementia, history of urinary tract infection, HLD, history of hyponatremia who was recently diagnosed having UTI and was given oral cephalosporin antibiotic for UTI.  She received 1 dose but she continued to have brief syncopal episode at home.  Patient's daughter [name] who was concerned and brought in to emergency room for evaluation.  As per the patient's daughter patient had 2 or 3 episodes of very brief passing out event but she would be normal after that.  There was no post syncopal confusion.  There was no fall or loss of urine or bladder.  The patient had a fever but they did not check the temperature at home.  Patient does not have nausea vomiting abdominal pain chest pain shortness of breath palpitations.  She denies hitting her head or any trauma.   ED Course: Upon arrival to the ED, patient is found to have elevated troponin and positive urinary tract infection.  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  Urologist evaluated the patient and advised for treating UTI at this point.  Cardiologist Dr. Mady was contacted by ED for elevated troponin.  Dr. Mady advised to start on heparin  drip and treat medically at this point.  Hospitalist service was consulted for evaluation for admission for possible sepsis  6/18.  Blood cultures positive for E. coli.  Patient not having any chest pain or shortness of breath.  On empiric heparin  drip for NSTEMI.  Increase Rocephin  up to 2 g daily. 6/19.  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  Case discussed with urology and urology spoke with family and decided on conservative management.  Echocardiogram shows  normal EF. 6/20.  E. coli is pansensitive.  Will switch Rocephin  over to Ancef  and likely home tomorrow on high-dose p.o. Keflex or Duricef.

## 2023-12-18 NOTE — Assessment & Plan Note (Addendum)
 Present on admission.  Since E. coli is pansensitive we will switch Rocephin  over to Ancef .  Will discharge home on 9 more days of high-dose Keflex.  Patient did have fever and tachycardia which meets SIRS criteria on admission.  Source is urinary infection with obstruction

## 2023-12-18 NOTE — Progress Notes (Signed)
 PHARMACY - PHYSICIAN COMMUNICATION CRITICAL VALUE ALERT - BLOOD CULTURE IDENTIFICATION (BCID)  Alexis Perez is an 89 y.o. female who presented to [facility] on 12/17/2023 with a chief complaint of syncope.  Assessment: 2/4 bottles GNRs; BCID detected E.coli with no resistance (source likely urinary)  Name of physician (or Provider) Contacted: Dr. Clelia Current  Current antibiotics: Ceftriaxone  1 gm daily  Changes to prescribed antibiotics recommended:  Increase ceftriaxone  to 2 gm daily Recommendations accepted by provider  Results for orders placed or performed during the hospital encounter of 12/17/23  Blood Culture ID Panel (Reflexed) (Collected: 12/17/2023  6:13 PM)  Result Value Ref Range   Enterococcus faecalis NOT DETECTED NOT DETECTED   Enterococcus Faecium NOT DETECTED NOT DETECTED   Listeria monocytogenes NOT DETECTED NOT DETECTED   Staphylococcus species NOT DETECTED NOT DETECTED   Staphylococcus aureus (BCID) NOT DETECTED NOT DETECTED   Staphylococcus epidermidis NOT DETECTED NOT DETECTED   Staphylococcus lugdunensis NOT DETECTED NOT DETECTED   Streptococcus species NOT DETECTED NOT DETECTED   Streptococcus agalactiae NOT DETECTED NOT DETECTED   Streptococcus pneumoniae NOT DETECTED NOT DETECTED   Streptococcus pyogenes NOT DETECTED NOT DETECTED   A.calcoaceticus-baumannii NOT DETECTED NOT DETECTED   Bacteroides fragilis NOT DETECTED NOT DETECTED   Enterobacterales DETECTED (A) NOT DETECTED   Enterobacter cloacae complex NOT DETECTED NOT DETECTED   Escherichia coli DETECTED (A) NOT DETECTED   Klebsiella aerogenes NOT DETECTED NOT DETECTED   Klebsiella oxytoca NOT DETECTED NOT DETECTED   Klebsiella pneumoniae NOT DETECTED NOT DETECTED   Proteus species NOT DETECTED NOT DETECTED   Salmonella species NOT DETECTED NOT DETECTED   Serratia marcescens NOT DETECTED NOT DETECTED   Haemophilus influenzae NOT DETECTED NOT DETECTED   Neisseria meningitidis NOT DETECTED NOT DETECTED    Pseudomonas aeruginosa NOT DETECTED NOT DETECTED   Stenotrophomonas maltophilia NOT DETECTED NOT DETECTED   Candida albicans NOT DETECTED NOT DETECTED   Candida auris NOT DETECTED NOT DETECTED   Candida glabrata NOT DETECTED NOT DETECTED   Candida krusei NOT DETECTED NOT DETECTED   Candida parapsilosis NOT DETECTED NOT DETECTED   Candida tropicalis NOT DETECTED NOT DETECTED   Cryptococcus neoformans/gattii NOT DETECTED NOT DETECTED   CTX-M ESBL NOT DETECTED NOT DETECTED   Carbapenem resistance IMP NOT DETECTED NOT DETECTED   Carbapenem resistance KPC NOT DETECTED NOT DETECTED   Carbapenem resistance NDM NOT DETECTED NOT DETECTED   Carbapenem resist OXA 48 LIKE NOT DETECTED NOT DETECTED   Carbapenem resistance VIM NOT DETECTED NOT DETECTED   Pansy Bogus, PharmD Pharmacy Resident  12/18/2023 7:56 AM

## 2023-12-18 NOTE — Assessment & Plan Note (Signed)
 Continue metoprolol.

## 2023-12-18 NOTE — Assessment & Plan Note (Addendum)
 Sodium 131. Continue to monitor.

## 2023-12-18 NOTE — Assessment & Plan Note (Signed)
 Conservative management.  Continue aspirin , Plavix , Imdur , metoprolol .  Completed heparin  drip last night.  Echocardiogram shows normal EF.

## 2023-12-18 NOTE — Assessment & Plan Note (Signed)
 Likely secondary to sepsis.

## 2023-12-18 NOTE — Plan of Care (Signed)
  Problem: Clinical Measurements: Goal: Ability to maintain clinical measurements within normal limits will improve Outcome: Progressing   Problem: Coping: Goal: Level of anxiety will decrease Outcome: Progressing   Problem: Pain Managment: Goal: General experience of comfort will improve and/or be controlled Outcome: Progressing   Problem: Skin Integrity: Goal: Risk for impaired skin integrity will decrease Outcome: Progressing

## 2023-12-18 NOTE — Assessment & Plan Note (Addendum)
 Urology to follow-up today with nuclear medicine test showing obstruction on the next steps.  Heparin  drip will be discontinued on 6/18 at 10 PM.  Cardiology would preferably like to continue aspirin  and Plavix .

## 2023-12-18 NOTE — Assessment & Plan Note (Addendum)
 Replaced

## 2023-12-19 ENCOUNTER — Inpatient Hospital Stay
Admit: 2023-12-19 | Discharge: 2023-12-19 | Disposition: A | Discharge status: Home / Self Care | Attending: Cardiovascular Disease | Admitting: Cardiovascular Disease

## 2023-12-19 DIAGNOSIS — I350 Nonrheumatic aortic (valve) stenosis: Secondary | ICD-10-CM

## 2023-12-19 DIAGNOSIS — I10 Essential (primary) hypertension: Secondary | ICD-10-CM | POA: Diagnosis not present

## 2023-12-19 DIAGNOSIS — I214 Non-ST elevation (NSTEMI) myocardial infarction: Secondary | ICD-10-CM | POA: Diagnosis not present

## 2023-12-19 DIAGNOSIS — N135 Crossing vessel and stricture of ureter without hydronephrosis: Secondary | ICD-10-CM | POA: Diagnosis not present

## 2023-12-19 DIAGNOSIS — N1330 Unspecified hydronephrosis: Secondary | ICD-10-CM | POA: Diagnosis not present

## 2023-12-19 DIAGNOSIS — A4151 Sepsis due to Escherichia coli [E. coli]: Secondary | ICD-10-CM | POA: Diagnosis not present

## 2023-12-19 DIAGNOSIS — N390 Urinary tract infection, site not specified: Secondary | ICD-10-CM | POA: Diagnosis not present

## 2023-12-19 LAB — BASIC METABOLIC PANEL WITH GFR
Anion gap: 11 (ref 5–15)
BUN: 15 mg/dL (ref 8–23)
CO2: 26 mmol/L (ref 22–32)
Calcium: 8.5 mg/dL — ABNORMAL LOW (ref 8.9–10.3)
Chloride: 94 mmol/L — ABNORMAL LOW (ref 98–111)
Creatinine, Ser: 0.56 mg/dL (ref 0.44–1.00)
GFR, Estimated: >60 mL/min (ref >60)
Glucose, Bld: 117 mg/dL — ABNORMAL HIGH (ref 70–99)
Potassium: 3 mmol/L — ABNORMAL LOW (ref 3.5–5.1)
Sodium: 131 mmol/L — ABNORMAL LOW (ref 135–145)

## 2023-12-19 LAB — HEPARIN LEVEL (UNFRACTIONATED)
Heparin Unfractionated: 0.4 [IU]/mL (ref 0.30–0.70)
Heparin Unfractionated: 0.35 [IU]/mL (ref 0.30–0.70)

## 2023-12-19 LAB — ECHOCARDIOGRAM COMPLETE
AR max vel: 1.74 cm2
AV Area VTI: 1.83 cm2
AV Area mean vel: 1.9 cm2
AV Mean grad: 15 mmHg
AV Peak grad: 24.9 mmHg
Ao pk vel: 2.49 m/s
Area-P 1/2: 4.8 cm2
Height: 60 in
MV VTI: 2.42 cm2
S' Lateral: 2.1 cm
Weight: 2096 [oz_av]

## 2023-12-19 LAB — MAGNESIUM: Magnesium: 1.8 mg/dL (ref 1.7–2.4)

## 2023-12-19 LAB — CBC
HCT: 36.7 % (ref 36.0–46.0)
Hemoglobin: 12.3 g/dL (ref 12.0–15.0)
MCH: 30.8 pg (ref 26.0–34.0)
MCHC: 33.5 g/dL (ref 30.0–36.0)
MCV: 92 fL (ref 80.0–100.0)
Platelets: 169 10*3/uL (ref 150–400)
RBC: 3.99 MIL/uL (ref 3.87–5.11)
RDW: 14.5 % (ref 11.5–15.5)
WBC: 7 10*3/uL (ref 4.0–10.5)
nRBC: 0 % (ref 0.0–0.2)

## 2023-12-19 MED ORDER — POTASSIUM CHLORIDE CRYS ER 20 MEQ PO TBCR
40.0000 meq | EXTENDED_RELEASE_TABLET | ORAL | Status: AC
  Administered 2023-12-19 (×2): 40 meq via ORAL
  Filled 2023-12-19 (×2): qty 2

## 2023-12-19 MED ORDER — ENOXAPARIN SODIUM 40 MG/0.4ML IJ SOSY
40.0000 mg | PREFILLED_SYRINGE | INTRAMUSCULAR | Status: DC
  Administered 2023-12-20 – 2023-12-21 (×2): 40 mg via SUBCUTANEOUS
  Filled 2023-12-19 (×2): qty 0.4

## 2023-12-19 MED ORDER — POTASSIUM CHLORIDE CRYS ER 20 MEQ PO TBCR: 40.0000 meq | EXTENDED_RELEASE_TABLET | Freq: Once | ORAL | Status: DC

## 2023-12-19 NOTE — Progress Notes (Signed)
 PHARMACY - ANTICOAGULATION CONSULT NOTE  Pharmacy Consult for Heparin   Indication: chest pain/ACS  Allergies  Allergen Reactions   Fentanyl Nausea And Vomiting   Sodium Pentobarbital [Pentobarbital] Anaphylaxis    When pt was sedated for surgery   Hydrochlorothiazide Other (See Comments)    hyponatremia   Ciprofloxacin      Blurry vision, sleepy and make pt sick   Codeine Other (See Comments)    Other reaction(s): UNKNOWN   Penicillin G Rash   Sulfa Antibiotics Rash   Patient Measurements: Height: 5' (152.4 cm) Weight: 59.4 kg (131 lb) IBW/kg (Calculated) : 45.5 HEPARIN  DW (KG): 57.6  Vital Signs: Temp: 98.3 F (36.8 C) (06/19 0827) Temp Source: Oral (06/19 0502) BP: 161/77 (06/19 0827) Pulse Rate: 92 (06/19 0827)  Labs: Recent Labs    12/17/23 1813 12/17/23 1813 12/17/23 2009 12/17/23 2314 12/18/23 0055 12/18/23 0706 12/18/23 1656 12/19/23 0206 12/19/23 0942  HGB 11.7*  --   --   --   --  12.2  --  12.3  --   HCT 35.0*  --   --   --   --  36.3  --  36.7  --   PLT 187  --   --   --   --  168  --  169  --   LABPROT 13.9  --   --   --   --  15.1  --   --   --   INR 1.1  --   --   --   --  1.2  --   --   --   HEPARINUNFRC  --    < >  --   --   --  0.11* 0.17* 0.40 0.35  CREATININE 0.56  --   --   --   --  0.50  --   --   --   TROPONINIHS 174*  --  571* 2,751* 4,599*  --   --   --   --    < > = values in this interval not displayed.   Estimated Creatinine Clearance: 31.7 mL/min (by C-G formula based on SCr of 0.5 mg/dL).  Medical History: Past Medical History:  Diagnosis Date   Hypertension    Medications:  Medications Prior to Admission  Medication Sig Dispense Refill Last Dose/Taking   amLODipine  (NORVASC ) 5 MG tablet Take 5 mg by mouth daily.   12/17/2023 Noon   aspirin  EC 81 MG tablet Take 81 mg by mouth daily. Swallow whole.   12/17/2023   cefdinir (OMNICEF) 300 MG capsule Take 300 mg by mouth 2 (two) times daily.   12/17/2023 Morning   Cholecalciferol   50 MCG (2000 UT) CAPS Take 1 capsule by mouth daily.   12/17/2023 Morning   ezetimibe  (ZETIA ) 10 MG tablet Take 10 mg by mouth at bedtime.   12/16/2023 at  8:00 PM   isosorbide  mononitrate (IMDUR ) 30 MG 24 hr tablet Take 120 mg by mouth daily.   12/17/2023 Morning   metoprolol  succinate (TOPROL -XL) 50 MG 24 hr tablet Take 100 mg by mouth at bedtime.   12/16/2023 at  8:00 PM   Multiple Vitamins-Minerals (PRESERVISION AREDS 2) CAPS Take 1 capsule by mouth daily.   12/17/2023 Morning   nitroGLYCERIN  (NITROSTAT ) 0.4 MG SL tablet Place under the tongue.   Taking   rosuvastatin  (CRESTOR ) 10 MG tablet Take 10 mg by mouth once a week.   Past Week   Calcium -Vitamin D -Vitamin K 500-100-40 MG-UNT-MCG CHEW Chew 1 tablet  by mouth daily. (Patient not taking: Reported on 12/17/2023)   Not Taking   clopidogrel  (PLAVIX ) 75 MG tablet Take 75 mg by mouth daily. (Patient not taking: Reported on 12/17/2023)   Not Taking   Assessment: Pharmacy consulted to dose heparin  in this 89 year old female admitted with ACS/NSTEMI.  No prior anticoag noted.CrCl = 31.7 ml/min   Goal of Therapy:  Heparin  level 0.3-0.7 units/ml Monitor platelets by anticoagulation protocol: Yes  Labs:  0618 0706 HL 0.11; subtherapeutic on 750 units/hr  0618 1656 HL 0.17; subtherapeutic on 950 units/hr 0619 0206 HL 0.40; therapeutic X 1  0619 0942 HL 0.35; therapeutic x 2    Plan:  HL therapeutic x 2; continue heparin  at current rate of 1150 units/hr  End date entered to complete 48 hours of heparin  infusion, with plan to transition to DVT prophlyatic Lovenox tomorrow CBC stable; monitor daily for s/sx of bleeding   Pansy Bogus, PharmD Pharmacy Resident  12/19/2023 10:10 AM

## 2023-12-19 NOTE — Progress Notes (Signed)
 Rounding Note   Patient Name: Alexis Perez Due Date of Encounter: 12/19/2023  [facility] Cardiologist: [facility]  Subjective Sitting up in bed, reports feeling well No complaints of chest pain Reports having some discomfort left scapular area that resolved by shifting her position in bed No family at the bedside  Scheduled Meds:  amLODipine   5 mg Oral Daily   aspirin  EC  81 mg Oral Daily   clopidogrel   75 mg Oral Daily   [START ON 12/20/2023] enoxaparin (LOVENOX) injection  40 mg Subcutaneous Q24H   ezetimibe   10 mg Oral QHS   isosorbide  mononitrate  120 mg Oral Daily   metoprolol  succinate  100 mg Oral QHS   potassium chloride   40 mEq Oral Once   Continuous Infusions:  cefTRIAXone  (ROCEPHIN )  IV 2 g (12/18/23 2158)   heparin  1,150 Units/hr (12/18/23 1858)   PRN Meds: acetaminophen  **OR** acetaminophen , HYDROcodone-acetaminophen , HYDROmorphone (DILAUDID) injection, nitroGLYCERIN , ondansetron  **OR** ondansetron  (ZOFRAN ) IV, polyethylene glycol   Vital Signs  Vitals:   12/18/23 2205 12/19/23 0502 12/19/23 0827 12/19/23 1148  BP: (!) 147/89 (!) 174/77 (!) 161/77 104/64  Pulse: (!) 109 98 92 (!) 101  Resp: 16 (!) 22 18 18   Temp: 98.4 F (36.9 C) 98.4 F (36.9 C) 98.3 F (36.8 C) 97.9 F (36.6 C)  TempSrc:  Oral    SpO2: 95% 96% 98% 95%  Weight:      Height:        Intake/Output Summary (Last 24 hours) at 12/19/2023 1258 Last data filed at 12/19/2023 1253 Gross per 24 hour  Intake 213.78 ml  Output 1100 ml  Net -886.22 ml      12/17/2023   10:16 PM 10/11/2021    2:10 AM 10/03/2021   12:27 AM  Last 3 Weights  Weight (lbs) 131 lb 132 lb 132 lb  Weight (kg) 59.421 kg 59.875 kg 59.875 kg      Telemetry Normal sinus rhythm- Personally Reviewed  ECG   - Personally Reviewed  Physical Exam  Constitutional:  oriented to person, place, and time. No distress.  HENT:  Head: Grossly normal Eyes:  no discharge. No scleral icterus.  Neck: No JVD, no  carotid bruits  Cardiovascular: Regular rate and rhythm, no murmurs appreciated Pulmonary/Chest: Clear to auscultation bilaterally, no wheezes or rales Abdominal: Soft.  no distension.  no tenderness.  Musculoskeletal: Normal range of motion Neurological:  normal muscle tone. Coordination normal. No atrophy Skin: Skin warm and dry Psychiatric: normal affect, pleasant   Labs High Sensitivity Troponin:   Recent Labs  Lab 12/17/23 1813 12/17/23 2009 12/17/23 2314 12/18/23 0055  TROPONINIHS 174* 571* 2,751* 4,599*     Chemistry Recent Labs  Lab 12/17/23 1813 12/18/23 0706 12/19/23 0942  NA 128* 132* 131*  K 3.6 3.0* 3.0*  CL 92* 94* 94*  CO2 25 28 26   GLUCOSE 123* 123* 117*  BUN 22 14 15   CREATININE 0.56 0.50 0.56  CALCIUM  8.9 8.9 8.5*  MG  --   --  1.8  PROT 6.7 6.3*  --   ALBUMIN 3.4* 3.2*  --   AST 22 46*  --   ALT 13 16  --   ALKPHOS 51 48  --   BILITOT 1.1 0.8  --   GFRNONAA >60 >60 >60  ANIONGAP 11 10 11     Lipids No results for input(s): CHOL, TRIG, HDL, LABVLDL, LDLCALC, CHOLHDL in the last 168 hours.  Hematology Recent Labs  Lab 12/17/23 1813 12/18/23 1610 12/19/23 0206  WBC 10.2 7.7 7.0  RBC 3.80* 3.97 3.99  HGB 11.7* 12.2 12.3  HCT 35.0* 36.3 36.7  MCV 92.1 91.4 92.0  MCH 30.8 30.7 30.8  MCHC 33.4 33.6 33.5  RDW 14.5 14.5 14.5  PLT 187 168 169   Thyroid No results for input(s): TSH, FREET4 in the last 168 hours.  BNPNo results for input(s): BNP, PROBNP in the last 168 hours.  DDimer No results for input(s): DDIMER in the last 168 hours.   Radiology  ECHOCARDIOGRAM COMPLETE Result Date: 12/19/2023    ECHOCARDIOGRAM REPORT   Patient Name:   Alexis Perez Date of Exam: 12/19/2023 Medical Rec #:  [id number]     Height:       60.0 in Accession #:    [id number]    Weight:       131.0 lb Date of Birth:  1924/04/23      BSA:          1.559 m Patient Age:    89 years      BP:           161/77 mmHg Patient Gender: F             HR:            92 bpm. Exam Location:  [facility] Procedure: 2D Echo, Cardiac Doppler and Color Doppler (Both Spectral and Color            Flow Doppler were utilized during procedure). Indications:     Aortic valve disorder I35.9  History:         Patient has prior history of Echocardiogram examinations, most                  recent 10/04/2021. Risk Factors:Hypertension.  Sonographer:     Broadus Canes Referring Phys:  [id number] Devorah Fonder Diagnosing Phys: Belva Boyden MD IMPRESSIONS  1. Left ventricular ejection fraction, by estimation, is 60 to 65%. The left ventricle has normal function. The left ventricle has no regional wall motion abnormalities. There is moderate left ventricular hypertrophy. Left ventricular diastolic parameters are consistent with Grade I diastolic dysfunction (impaired relaxation).  2. Right ventricular systolic function is normal. The right ventricular size is normal. There is normal pulmonary artery systolic pressure. The estimated right ventricular systolic pressure is 26.9 mmHg.  3. The mitral valve is normal in structure. No evidence of mitral valve regurgitation. No evidence of mitral stenosis. Moderate mitral annular calcification.  4. The aortic valve is calcified. There is moderate calcification of the aortic valve. Aortic valve regurgitation is not visualized. Moderate aortic valve stenosis. Aortic valve area, by VTI measures 1.83 cm. Aortic valve mean gradient measures 15.0 mmHg. Aortic valve Vmax measures 2.49 m/s.  5. The inferior vena cava is normal in size with greater than 50% respiratory variability, suggesting right atrial pressure of 3 mmHg. FINDINGS  Left Ventricle: Left ventricular ejection fraction, by estimation, is 60 to 65%. The left ventricle has normal function. The left ventricle has no regional wall motion abnormalities. Strain was performed and the global longitudinal strain is indeterminate. The left ventricular internal cavity size was normal in size. There is moderate  left ventricular hypertrophy. Left ventricular diastolic parameters are consistent with Grade I diastolic dysfunction (impaired relaxation). Right Ventricle: The right ventricular size is normal. No increase in right ventricular wall thickness. Right ventricular systolic function is normal. There is normal pulmonary artery systolic pressure. The tricuspid regurgitant velocity is 2.34 m/s, and  with an assumed  right atrial pressure of 5 mmHg, the estimated right ventricular systolic pressure is 26.9 mmHg. Left Atrium: Left atrial size was normal in size. Right Atrium: Right atrial size was normal in size. Pericardium: There is no evidence of pericardial effusion. Mitral Valve: The mitral valve is normal in structure. There is mild calcification of the mitral valve leaflet(s). Moderate mitral annular calcification. No evidence of mitral valve regurgitation. No evidence of mitral valve stenosis. MV peak gradient, 12.7 mmHg. The mean mitral valve gradient is 6.0 mmHg. Tricuspid Valve: The tricuspid valve is normal in structure. Tricuspid valve regurgitation is not demonstrated. No evidence of tricuspid stenosis. Aortic Valve: The aortic valve is calcified. There is moderate calcification of the aortic valve. Aortic valve regurgitation is not visualized. Moderate aortic stenosis is present. Aortic valve mean gradient measures 15.0 mmHg. Aortic valve peak gradient  measures 24.9 mmHg. Aortic valve area, by VTI measures 1.83 cm. Pulmonic Valve: The pulmonic valve was normal in structure. Pulmonic valve regurgitation is not visualized. No evidence of pulmonic stenosis. Aorta: The aortic root is normal in size and structure. Venous: The inferior vena cava is normal in size with greater than 50% respiratory variability, suggesting right atrial pressure of 3 mmHg. IAS/Shunts: No atrial level shunt detected by color flow Doppler. Additional Comments: 3D was performed not requiring image post processing on an independent  workstation and was indeterminate.  LEFT VENTRICLE PLAX 2D LVIDd:         3.80 cm   Diastology LVIDs:         2.10 cm   LV e' medial:    3.59 cm/s LV PW:         0.90 cm   LV E/e' medial:  23.6 LV IVS:        1.40 cm   LV e' lateral:   3.59 cm/s LVOT diam:     2.00 cm   LV E/e' lateral: 23.6 LV SV:         76 LV SV Index:   49 LVOT Area:     3.14 cm  RIGHT VENTRICLE RV Basal diam:  3.40 cm RV Mid diam:    3.70 cm RV S prime:     16.30 cm/s TAPSE (M-mode): 1.7 cm LEFT ATRIUM             Index        RIGHT ATRIUM          Index LA diam:        4.30 cm 2.76 cm/m   RA Area:     9.74 cm LA Vol (A2C):   40.5 ml 25.97 ml/m  RA Volume:   16.70 ml 10.71 ml/m LA Vol (A4C):   46.2 ml 29.63 ml/m LA Biplane Vol: 47.2 ml 30.27 ml/m  AORTIC VALVE AV Area (Vmax):    1.74 cm AV Area (Vmean):   1.90 cm AV Area (VTI):     1.83 cm AV Vmax:           249.33 cm/s AV Vmean:          182.333 cm/s AV VTI:            0.415 m AV Peak Grad:      24.9 mmHg AV Mean Grad:      15.0 mmHg LVOT Vmax:         138.00 cm/s LVOT Vmean:        110.000 cm/s LVOT VTI:          0.242  m LVOT/AV VTI ratio: 0.58  AORTA Ao Root diam: 3.30 cm MITRAL VALVE                TRICUSPID VALVE MV Area (PHT): 4.80 cm     TR Peak grad:   21.9 mmHg MV Area VTI:   2.42 cm     TR Vmax:        234.00 cm/s MV Peak grad:  12.7 mmHg MV Mean grad:  6.0 mmHg     SHUNTS MV Vmax:       1.78 m/s     Systemic VTI:  0.24 m MV Vmean:      110.0 cm/s   Systemic Diam: 2.00 cm MV Decel Time: 158 msec MV E velocity: 84.90 cm/s MV A velocity: 140.00 cm/s MV E/A ratio:  0.61 Belva Boyden MD Electronically signed by Belva Boyden MD Signature Date/Time: 12/19/2023/12:55:26 PM    Final    NM Renal Imaging Flow W/Pharm Result Date: 12/18/2023 CLINICAL DATA:  Right hydronephrosis, urinary tract infection, sepsis EXAM: NUCLEAR MEDICINE RENAL SCAN WITH DIURETIC ADMINISTRATION TECHNIQUE: Radionuclide angiographic and sequential renal images were obtained after intravenous injection  of radiopharmaceutical. Imaging was continued during slow intravenous injection of Lasix approximately 15 minutes after the start of the examination. RADIOPHARMACEUTICALS:  4.99 mCi Technetium-55m MAG3 IV COMPARISON:  12/17/2023 FINDINGS: Flow: Asymmetric decreased radiotracer uptake within the right kidney relative to the left on flow phase of the exam. Left renogram: Normal left radiographic curve. Right renogram: Blunted peak of the right renal graphic curve, otherwise normal appearance. Differential: Left kidney = 59.8 % Right kidney = 40.2 % T1/2 post Lasix : Left kidney = 3.8 min Right kidney = 20.7 min IMPRESSION: 1. 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 2. Normal left renal function. Electronically Signed   By: Bobbye Burrow M.D.   On: 12/18/2023 20:23   CT Angio Chest/Abd/Pel for Dissection W and/or Wo Contrast Result Date: 12/17/2023 EXAM: CTA CHEST, ABDOMEN AND PELVIS WITHOUT AND WITH CONTRAST 12/17/2023 07:45:54 PM TECHNIQUE: CTA of the chest was performed without and with the administration of intravenous contrast. CTA of the abdomen and pelvis was performed without and with the administration of intravenous contrast. Multiplanar reformatted images are provided for review. MIP images are provided for review. Automated exposure control, iterative reconstruction, and/or weight based adjustment of the mA/kV was utilized to reduce the radiation dose to as low as reasonably achievable. COMPARISON: None available. CLINICAL HISTORY: Aortic aneurysm suspected. Pt BIB [facility] from home with reports of syncope, recent dx of UTI yesterday, and hyponatremia. Denies pain. FINDINGS: VASCULATURE: AORTA: No evidence of thoracic aortic aneurysm or dissection. Atherosclerotic calcification of the aortic arch. No  evidence of abdominal aortic aneurysm or dissection. Atherosclerotic calcifications. PULMONARY ARTERIES: Although not tailored for evaluation of the pulmonary arteries, there is no evidence of pulmonary embolism. GREAT VESSELS OF AORTIC ARCH: No acute finding. No dissection. No arterial occlusion or significant stenosis. CELIAC TRUNK: The celiac artery is patent, with atherosclerotic calcification. SUPERIOR MESENTERIC ARTERY: The SMA is patent, with atherosclerotic calcification. INFERIOR MESENTERIC ARTERY: No acute finding. No occlusion or significant stenosis. RENAL ARTERIES: Bilateral renal arteries are patent, with atherosclerotic calcifications at the origin. ILIAC ARTERIES: Atherosclerotic calcification of the bilateral iliac arteries, although patent. CHEST: MEDIASTINUM: No mediastinal lymphadenopathy. The heart and pericardium demonstrate no acute  abnormality. Severe 3-vessel coronary atherosclerosis. Dominant 3.4 cm left thyroid nodule (image 29). Given the patient's age, no follow-up is recommended. LUNGS AND PLEURA: Mild subpleural reticulation/fibrosis in the lungs bilaterally, lower lobe predominant suggesting mild chronic interstitial lung disease or post-infectious inflammatory scarring. Mild interlobular septal thickening in the bilateral upper lobes (for example, image 56) at least raising the possibility of mild interstitial edema. Scattered small bilateral pulmonary nodules measuring up to 4 mm likely benign. Given the patient's age, no dedicated follow-up imaging is suggested. No evidence of pleural effusion or pneumothorax. THORACIC BONES AND SOFT TISSUES: No acute findings. ABDOMEN AND PELVIS: LIVER: Scattered hepatic cysts measuring up to 6.5 cm in segment 4, benign. GALLBLADDER AND BILE DUCTS: Gallbladder is unremarkable. No biliary ductal dilatation. SPLEEN: The spleen is unremarkable. PANCREAS: The pancreas is unremarkable. ADRENAL GLANDS: Bilateral adrenal glands demonstrate no acute  abnormality. KIDNEYS, URETERS AND BLADDER: Simple bilateral renal cysts are present measuring up to 2.7 cm (image 179), benign Bosniak 1. No follow-up is recommended. 2.2 cm hyperdense lesion in the medial right lower kidney (image 199), indeterminate. Given the patient's age, no follow-up is recommended. 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 Very mild soft tissue prominence at the right UVJ is also possible (image 268). Unclear that urologic evaluation is beneficial in this patient. No stones in the kidneys or ureters. No perinephric or periureteral stranding. Urinary bladder is unremarkable. GI AND BOWEL: The appendix is not visualized. There is moderate right colonic stool burden, suggesting mild constipation. Stomach and duodenal sweep demonstrate no acute abnormality. There is no bowel obstruction. No abnormal bowel wall thickening or distension. REPRODUCTIVE: Reproductive organs are unremarkable. PERITONEUM AND RETROPERITONEUM: No ascites or free air. LYMPH NODES: No lymphadenopathy. ABDOMINAL BONES AND SOFT TISSUES: Mild degenerative changes of the lumbar spine. No acute soft tissue abnormality. IMPRESSION: 1. No thoracoabdominal aortic aneurysm or dissection. 2. 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 Urology consultation can be considered but unclear that aggressive evaluation is appropriate. 3. Possible mild interstitial edema, equivocal. No pleural effusion. 4. Additional ancillary findings as above. No follow-up is recommended given the patient's age. Electronically signed by: Zadie Herter MD 12/17/2023 08:56 PM EDT RP Workstation: [id number]   CT Head Wo Contrast Result Date: 12/17/2023 CLINICAL DATA:  Delirium EXAM: CT HEAD WITHOUT CONTRAST TECHNIQUE: Contiguous axial images  were obtained from the base of the skull through the vertex without intravenous contrast. RADIATION DOSE REDUCTION: This exam was performed according to the departmental dose-optimization program which includes automated exposure control, adjustment of the mA and/or kV according to patient size and/or use of iterative reconstruction technique. COMPARISON:  None Available. FINDINGS: Brain: Hypodensities throughout the periventricular white matter and basal ganglia consistent with chronic small vessel ischemic changes. No evidence of acute infarct or hemorrhage. Lateral ventricles and remaining midline structures are unremarkable. No acute extra-axial fluid collections. No mass effect. Vascular: No hyperdense vessel or unexpected calcification. Skull: Normal. Negative for fracture or focal lesion. Sinuses/Orbits: Polypoid mucosal thickening of the maxillary and frontal sinuses. Other: None. IMPRESSION: 1. No acute intracranial process. Electronically Signed   By: Bobbye Burrow M.D.   On: 12/17/2023 20:02   DG Chest Port 1 View Result Date: 12/17/2023 EXAM: 1 VIEW XRAY OF THE CHEST 12/17/2023 06:58:00 PM COMPARISON: 10/11/2021 CLINICAL HISTORY: Questionable sepsis - evaluate for abnormality. Pt BIB [facility] from home with  reports of syncope, recent dx of UTI yesterday, and hyponatremia. Denies pain. Pt received 500cc NacL en route. FINDINGS: LUNGS AND PLEURA: Increased interstitial markings, chronic, without frank interstitial edema. No focal pulmonary opacity. No pleural effusion. No pneumothorax. HEART AND MEDIASTINUM: No acute abnormality of the cardiac and mediastinal silhouettes. Thoracic aortic atherosclerosis. BONES AND SOFT TISSUES: No acute osseous abnormality. IMPRESSION: 1. No acute findings. Electronically signed by: Zadie Herter MD 12/17/2023 07:08 PM EDT RP Workstation: [id number]    Cardiac Studies Echo Normal LV function, moderate LVH, moderate aortic valve stenosis   Patient Profile   Alexis Perez is a 89 y.o. female with a hx of coronary artery disease, occluded RCA, prior stenting 2023 to left circumflex, severe residual mid LAD disease, dementia, hyponatremia, urinary tract infections presenting with encephalopathy, mental status changes, brief syncopal episode.  Cardiology consulted for elevated troponin   Assessment & Plan   Non-ST elevation MI Presenting with encephalopathy, UTI, bacteremia/sepsis  denied chest pain concerning for angina Troponin greater than 4000, markedly abnormal EKG on arrival consistent with ischemia -Known disease with occluded RCA, prior stenting to left circumflex, residual 80% mid LAD disease - Treated with heparin  infusion 48 hours - Discussed various treatment options with family at the bedside yesterday, plan made for medical management - Given lack of symptoms, age 43, in the setting of bacteremia/sepsis, echocardiogram with normal LV function Continue medical management, no plan for ischemic workup at this time - On aspirin , Plavix , Imdur , metoprolol    E. coli bacteremia/sepsis UTI, followed by urology -Urology to discuss with family various treatment options   Hyponatremia Sodium 131,  Free water restriction Not on diuretics   Hypokalemia Received dose IV Lasix yesterday Will replete potassium again, 40 mill equivalents x 2  For questions or updates, please contact [facility] Please consult [email or web address] for contact info under     Signed, Oaklee Sunga, MD  12/19/2023, 12:58 PM

## 2023-12-19 NOTE — Progress Notes (Signed)
 PHARMACY - ANTICOAGULATION CONSULT NOTE  Pharmacy Consult for Heparin   Indication: chest pain/ACS  Allergies  Allergen Reactions   Fentanyl Nausea And Vomiting   Sodium Pentobarbital [Pentobarbital] Anaphylaxis    When pt was sedated for surgery   Hydrochlorothiazide Other (See Comments)    hyponatremia   Ciprofloxacin      Blurry vision, sleepy and make pt sick   Codeine Other (See Comments)    Other reaction(s): UNKNOWN   Penicillin G Rash   Sulfa Antibiotics Rash   Patient Measurements: Height: 5' (152.4 cm) Weight: 59.4 kg (131 lb) IBW/kg (Calculated) : 45.5 HEPARIN  DW (KG): 57.6  Vital Signs: Temp: 98.4 F (36.9 C) (06/18 2205) BP: 147/89 (06/18 2205) Pulse Rate: 109 (06/18 2205)  Labs: Recent Labs    12/17/23 1813 12/17/23 2009 12/17/23 2314 12/18/23 0055 12/18/23 0706 12/18/23 1656 12/19/23 0206  HGB 11.7*  --   --   --  12.2  --   --   HCT 35.0*  --   --   --  36.3  --   --   PLT 187  --   --   --  168  --   --   LABPROT 13.9  --   --   --  15.1  --   --   INR 1.1  --   --   --  1.2  --   --   HEPARINUNFRC  --   --   --   --  0.11* 0.17* 0.40  CREATININE 0.56  --   --   --  0.50  --   --   TROPONINIHS 174* 571* 2,751* 4,599*  --   --   --    Estimated Creatinine Clearance: 31.7 mL/min (by C-G formula based on SCr of 0.5 mg/dL).  Medical History: Past Medical History:  Diagnosis Date   Hypertension    Medications:  Medications Prior to Admission  Medication Sig Dispense Refill Last Dose/Taking   amLODipine  (NORVASC ) 5 MG tablet Take 5 mg by mouth daily.   12/17/2023 Noon   aspirin  EC 81 MG tablet Take 81 mg by mouth daily. Swallow whole.   12/17/2023   cefdinir (OMNICEF) 300 MG capsule Take 300 mg by mouth 2 (two) times daily.   12/17/2023 Morning   Cholecalciferol  50 MCG (2000 UT) CAPS Take 1 capsule by mouth daily.   12/17/2023 Morning   ezetimibe  (ZETIA ) 10 MG tablet Take 10 mg by mouth at bedtime.   12/16/2023 at  8:00 PM   isosorbide  mononitrate  (IMDUR ) 30 MG 24 hr tablet Take 120 mg by mouth daily.   12/17/2023 Morning   metoprolol  succinate (TOPROL -XL) 50 MG 24 hr tablet Take 100 mg by mouth at bedtime.   12/16/2023 at  8:00 PM   Multiple Vitamins-Minerals (PRESERVISION AREDS 2) CAPS Take 1 capsule by mouth daily.   12/17/2023 Morning   nitroGLYCERIN  (NITROSTAT ) 0.4 MG SL tablet Place under the tongue.   Taking   rosuvastatin  (CRESTOR ) 10 MG tablet Take 10 mg by mouth once a week.   Past Week   Calcium -Vitamin D -Vitamin K 500-100-40 MG-UNT-MCG CHEW Chew 1 tablet by mouth daily. (Patient not taking: Reported on 12/17/2023)   Not Taking   clopidogrel  (PLAVIX ) 75 MG tablet Take 75 mg by mouth daily. (Patient not taking: Reported on 12/17/2023)   Not Taking   Assessment: Pharmacy consulted to dose heparin  in this 89 year old female admitted with ACS/NSTEMI.  No prior anticoag noted.CrCl = 31.7 ml/min  Goal of Therapy:  Heparin  level 0.3-0.7 units/ml Monitor platelets by anticoagulation protocol: Yes  Labs:  0618 0706 HL 0.11; subtherapeutic on 750 units/hr  0618 1656 HL 0.17; subtherapeutic on 950 units/hr 0619 0206 HL 0.40; therapeutic X 1    Plan:  06/19:  HL @ 0206 = 0.40, therapeutic X 1 - Will continue pt on current rate and recheck HL in 8 hrs  CBC stable; monitor daily for s/sx of bleeding   Philisha Weinel D, PharmD Clinical Pharmacist 12/19/2023 2:32 AM

## 2023-12-19 NOTE — Progress Notes (Signed)
 Mobility Specialist - Progress Note   12/19/23 1637  Mobility  Activity Ambulated with assistance in room;Stood at bedside;Dangled on edge of bed  Level of Assistance Standby assist, set-up cues, supervision of patient - no hands on  Assistive Device Front wheel walker  Distance Ambulated (ft) 20 ft  Activity Response Tolerated well  Mobility visit 1 Mobility     Pt lying in bed upon arrival, utilizing RA. Pt pleasant and agreeable to activity. Completed bed mobility modI + extra time. BP checked in sitting: 155/863 with 98 HR. BP also checked in standing: 141/70 with 103 HR. MinA to stand. Pt denied dizziness. Continued activity with ambulation in room. No LOB. No complaints. Pt returned to bed with alarm set, needs in reach.    Searcy Czech Mobility Specialist 12/19/23, 4:41 PM

## 2023-12-19 NOTE — Plan of Care (Signed)
  Problem: Health Behavior/Discharge Planning: Goal: Ability to manage health-related needs will improve Outcome: Progressing   Problem: Clinical Measurements: Goal: Diagnostic test results will improve Outcome: Progressing   Problem: Activity: Goal: Risk for activity intolerance will decrease Outcome: Progressing   Problem: Elimination: Goal: Will not experience complications related to urinary retention Outcome: Progressing   Problem: Safety: Goal: Ability to remain free from injury will improve Outcome: Progressing

## 2023-12-19 NOTE — Progress Notes (Signed)
 Progress Note   Patient: Alexis Perez RUE:[id number] DOB: 01/07/1924 DOA: 12/17/2023     2 DOS: the patient was seen and examined on 12/19/2023   Brief hospital course: 89 y.o. female with medical history significant for CAD s/p stent in 2022, HTN, dementia, history of urinary tract infection, HLD, history of hyponatremia who was recently diagnosed having UTI and was given oral cephalosporin antibiotic for UTI.  She received 1 dose but she continued to have brief syncopal episode at home.  Patient's daughter Devra Fontana who was concerned and brought in to emergency room for evaluation.  As per the patient's daughter patient had 2 or 3 episodes of very brief passing out event but she would be normal after that.  There was no post syncopal confusion.  There was no fall or loss of urine or bladder.  The patient had a fever but they did not check the temperature at home.  Patient does not have nausea vomiting abdominal pain chest pain shortness of breath palpitations.  She denies hitting her head or any trauma.   ED Course: Upon arrival to the ED, patient is found to have elevated troponin and positive urinary tract infection.  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  Urologist evaluated the patient and advised for treating UTI at this point.  Cardiologist Dr. Nolan Battle was contacted by ED for elevated troponin.  Dr. Nolan Battle advised to start on heparin  drip and treat medically at this point.  Hospitalist service was consulted for evaluation for admission for possible sepsis  6/18.  Blood cultures positive for E. coli.  Patient not having any chest pain or shortness of breath.  On empiric heparin  drip for NSTEMI.  Increase Rocephin  up to 2 g daily. 6/19.  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  Case discussed with urology and urology will speak with patient and family.  Echocardiogram shows normal EF.  Assessment and Plan: * Sepsis due to Escherichia coli (E. coli) (HCC) Present on admission.  Increase Rocephin  to 2 g daily.  Follow-up sensitivities of blood cultures.  Patient did have fever and tachycardia which meets SIRS criteria.  Source is urinary infection and acute cystitis with hematuria.  Non-ST elevation (NSTEMI) myocardial infarction [facility]) Conservative management with heparin  drip, aspirin , Plavix , Imdur , metoprolol .  Echocardiogram ordered.  Hydronephrosis, right Case discussed with urology since the patient does have positive blood cultures with sepsis he recommended getting a nuclear medicine Lasix renal scan.  Essential hypertension Continue metoprolol   Hypokalemia Replace orally  Hyponatremia Sodium 132.  Continue to monitor  Syncope and collapse Likely secondary to sepsis        Subjective: Patient feels okay.  Offers no complaints.  Did have some back pain yesterday evening but felt better with moving around.  No complaints of chest pain or shortness of breath.  Urinating well.  Physical Exam: Vitals:   12/18/23 2205 12/19/23 0502 12/19/23 0827 12/19/23 1148  BP: (!) 147/89 (!) 174/77 (!) 161/77 104/64  Pulse: (!) 109 98 92 (!) 101  Resp: 16 (!) 22 18 18   Temp: 98.4 F (36.9 C) 98.4 F (36.9 C) 98.3 F (36.8 C) 97.9 F (36.6 C)  TempSrc:  Oral    SpO2: 95% 96% 98% 95%  Weight:  Height:       Physical Exam HENT:     Head: Normocephalic.     Mouth/Throat:     Pharynx: No oropharyngeal exudate.   Eyes:     General: Lids are normal.     Conjunctiva/sclera: Conjunctivae normal.    Cardiovascular:     Rate and Rhythm: Normal rate and regular rhythm.     Heart sounds: Normal heart sounds, S1 normal and S2 normal.  Pulmonary:     Breath sounds: No decreased breath sounds,  wheezing, rhonchi or rales.  Abdominal:     Palpations: Abdomen is soft.     Tenderness: There is no abdominal tenderness.   Musculoskeletal:     Right lower leg: No swelling.     Left lower leg: No swelling.   Skin:    General: Skin is warm.     Findings: No rash.   Neurological:     Mental Status: She is alert and oriented to person, place, and time.     Data Reviewed: Nuclear medicine renal scan and echocardiogram reviewed above Sodium 131, potassium 3.0, creatinine 0.56, white blood cell count 12.3, hemoglobin 7.0, platelet count 169  Family Communication: Spoke with daughter at the bedside  Disposition: Status is: Inpatient Remains inpatient appropriate because: Heparin  drip will be discontinued tonight at 10 PM.  Continue IV Rocephin  for sepsis.  Urology to speak with family about what to do next.  Planned Discharge Destination: Home    Time spent: 29 minutes Case discussed with neurology and cardiology  Author: Verla Glaze, MD 12/19/2023 1:21 PM  For on call review [email or web address].

## 2023-12-19 NOTE — Progress Notes (Signed)
*  PRELIMINARY RESULTS* Echocardiogram 2D Echocardiogram has been performed.  Alexis Perez 12/19/2023, 11:11 AM

## 2023-12-20 DIAGNOSIS — I10 Essential (primary) hypertension: Secondary | ICD-10-CM | POA: Diagnosis not present

## 2023-12-20 DIAGNOSIS — I2489 Other forms of acute ischemic heart disease: Secondary | ICD-10-CM

## 2023-12-20 DIAGNOSIS — I214 Non-ST elevation (NSTEMI) myocardial infarction: Secondary | ICD-10-CM | POA: Diagnosis not present

## 2023-12-20 DIAGNOSIS — N1330 Unspecified hydronephrosis: Secondary | ICD-10-CM | POA: Diagnosis not present

## 2023-12-20 DIAGNOSIS — A4151 Sepsis due to Escherichia coli [E. coli]: Secondary | ICD-10-CM | POA: Diagnosis not present

## 2023-12-20 LAB — CULTURE, BLOOD (ROUTINE X 2)

## 2023-12-20 MED ORDER — ROSUVASTATIN CALCIUM 10 MG PO TABS
10.0000 mg | ORAL_TABLET | Freq: Every day | ORAL | Status: DC
  Administered 2023-12-21: 10 mg via ORAL
  Filled 2023-12-20: qty 1

## 2023-12-20 MED ORDER — POTASSIUM CHLORIDE CRYS ER 20 MEQ PO TBCR
40.0000 meq | EXTENDED_RELEASE_TABLET | ORAL | Status: AC
  Administered 2023-12-20 (×2): 40 meq via ORAL
  Filled 2023-12-20 (×2): qty 2

## 2023-12-20 MED ORDER — CEFAZOLIN SODIUM-DEXTROSE 2-4 GM/100ML-% IV SOLN
2.0000 g | Freq: Three times a day (TID) | INTRAVENOUS | Status: DC
  Administered 2023-12-20 – 2023-12-21 (×2): 2 g via INTRAVENOUS
  Filled 2023-12-20 (×2): qty 100

## 2023-12-20 NOTE — Progress Notes (Signed)
 Occupational Therapy Evaluation Patient Details Name: Alexis Perez MRN: [id number] DOB: 1923/10/23 Today's Date: 12/20/2023   History of Present Illness   Pt is a 89 y.o. female with medical history significant for CAD s/p stent in 2022, HTN, dementia, history of urinary tract infection, HLD, history of hyponatremia who was recently diagnosed having UTI and was given oral cephalosporin antibiotic for UTI.  She received 1 dose but she continued to have brief syncopal episode at home.     Clinical Impressions Pt was seen for OT evaluation this date. Prior to hospital admission, pt was MODI/indep completing all ADL and most IADLs her self. Pt lives with lots of family within the home, who are very supportive. Pt endorses she manages her own medication and assist her family with meals. Pt presents to acute OT demonstrating ADL performance and functional mobility not far from her functional baseline (See OT problem list for additional functional deficits). Pt currently requires supervision for bed mobility with extra time to come to the EOB, no physical assistance required. Pt STS from EOB, CGA with slightly elevated bed level and use of RW, verbal cues for technique. Pt amb into BR with CGA for safety, completed toileting/pericare with supervision assistance. Completed sink level grooming; with CGA progressed to supervision demonstrating steady reaching within BOS. 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 Pt would benefit from skilled OT services to address noted impairments and functional limitations (see below for any additional details) in order to maximize safety and independence while minimizing falls risk and caregiver burden. Do not anticipate the need for follow up OT services upon acute hospital DC.      If plan is discharge home, recommend the following:   A little help with walking and/or transfers;Assistance with cooking/housework     Functional Status  Assessment   Patient has had a recent decline in their functional status and demonstrates the ability to make significant improvements in function in a reasonable and predictable amount of time.     Equipment Recommendations   None recommended by OT     Recommendations for Other Services         Precautions/Restrictions   Precautions Precautions: Fall Recall of Precautions/Restrictions: Intact Restrictions Weight Bearing Restrictions Per Provider Order: No     Mobility Bed Mobility Overal bed mobility: Needs Assistance Bed Mobility: Supine to Sit     Supine to sit: Supervision, HOB elevated     General bed mobility comments: Extra time to complete, no physical assistance    Transfers Overall transfer level: Needs assistance Equipment used: Rolling walker (2 wheels) Transfers: Sit to/from Stand Sit to Stand: Contact guard assist, From elevated surface           General transfer comment: STS from EOB with RW, verbal cues for technique      Balance Overall balance assessment: Needs assistance Sitting-balance support: Feet supported, No upper extremity supported Sitting balance-Leahy Scale: Good Sitting balance - Comments: Steady reaching with BOS   Standing balance support: During functional activity, Bilateral upper extremity supported Standing balance-Leahy Scale: Fair Standing balance comment: Often puts RW to the side when completing ADLs, edu on DME management during ADL completion                           ADL either performed or assessed with clinical judgement   ADL Overall ADL's : Needs assistance/impaired Eating/Feeding: Set up;Sitting   Grooming: Wash/dry face;Wash/dry  hands;Oral care;Standing;Supervision/safety Grooming Details (indicate cue type and reason): Sink level ADLs         Upper Body Dressing : Set up;Sitting   Lower Body Dressing: Contact guard assist;Sit to/from stand   Toilet Transfer: Contact guard  assist;Ambulation;Comfort height toilet;Rolling walker (2 wheels)   Toileting- Clothing Manipulation and Hygiene: Supervision/safety;Sitting/lateral lean       Functional mobility during ADLs: Contact guard assist;Rolling walker (2 wheels) General ADL Comments: Supervision for static grooming tasks in sitting/standing, supervision for sitting ADLs     Vision Patient Visual Report: No change from baseline Vision Assessment?: Wears glasses for reading     Perception         Praxis         Pertinent Vitals/Pain Pain Assessment Pain Assessment: No/denies pain     Extremity/Trunk Assessment Upper Extremity Assessment Upper Extremity Assessment: Overall WFL for tasks assessed   Lower Extremity Assessment Lower Extremity Assessment: Generalized weakness;Defer to PT evaluation       Communication Communication Communication: No apparent difficulties   Cognition Arousal: Alert Behavior During Therapy: WFL for tasks assessed/performed Cognition: No apparent impairments             OT - Cognition Comments: A/Ox4                 Following commands: Intact       Cueing  General Comments   Cueing Techniques: Verbal cues  Daughter at bedside halfway through session.   Exercises Exercises: Other exercises Other Exercises Other Exercises: Edu: Role of OT, safe ADLs completion with DME management, fall prevention techniques   Shoulder Instructions      Home Living Family/patient expects to be discharged to:: Private residence Living Arrangements: Children Available Help at Discharge: Family;Available 24 hours/day Type of Home: House Home Access: Ramped entrance     Home Layout: One level     Bathroom Shower/Tub: Chief Strategy Officer: Standard Bathroom Accessibility: Yes How Accessible: Accessible via walker Home Equipment: Rolling Walker (2 wheels);Grab bars - toilet;Grab bars - tub/shower;Shower seat          Prior  Functioning/Environment Prior Level of Function : Independent/Modified Independent             Mobility Comments: Rolling walker at baseline ADLs Comments: In house family members cook, Pt assists them. Pt reports she allows her family to drive her around. Pt reports she manages her own medication.    OT Problem List: Decreased activity tolerance;Decreased strength;Impaired balance (sitting and/or standing);Decreased knowledge of use of DME or AE   OT Treatment/Interventions: Self-care/ADL training;Therapeutic exercise;Energy conservation;DME and/or AE instruction;Therapeutic activities;Patient/family education      OT Goals(Current goals can be found in the care plan section)   Acute Rehab OT Goals Patient Stated Goal: Return home with family OT Goal Formulation: With patient/family Time For Goal Achievement: 01/03/24 Potential to Achieve Goals: Good ADL Goals Pt Will Perform Grooming: with modified independence;standing Pt Will Perform Lower Body Dressing: with modified independence;sitting/lateral leans Pt Will Transfer to Toilet: with modified independence;ambulating Pt Will Perform Toileting - Clothing Manipulation and hygiene: with modified independence;sitting/lateral leans   OT Frequency:  Min 1X/week    Co-evaluation              AM-PAC OT 6 Clicks Daily Activity     Outcome Measure Help from another person eating meals?: None Help from another person taking care of personal grooming?: A Little Help from another person toileting, which includes using toliet,  bedpan, or urinal?: None Help from another person bathing (including washing, rinsing, drying)?: A Little Help from another person to put on and taking off regular upper body clothing?: None Help from another person to put on and taking off regular lower body clothing?: None 6 Click Score: 22   End of Session Equipment Utilized During Treatment: Rolling walker (2 wheels);Gait belt Nurse Communication:  Mobility status  Activity Tolerance: Patient tolerated treatment well Patient left: in chair;with call bell/phone within reach;with family/visitor present  OT Visit Diagnosis: Unsteadiness on feet (R26.81);Other abnormalities of gait and mobility (R26.89)                Time: 4132-4401 OT Time Calculation (min): 31 min Charges:  OT General Charges $OT Visit: 1 Visit OT Evaluation $OT Eval Low Complexity: 1 Low OT Treatments $Self Care/Home Management : 8-22 mins  Rosaria Common M.S. OTR/L  12/20/23, 9:56 AM

## 2023-12-20 NOTE — Evaluation (Signed)
 Physical Therapy Evaluation Patient Details Name: Alexis Perez MRN: [id number] DOB: 1923/11/11 Today's Date: 12/20/2023  History of Present Illness  Pt is a 89 y.o. female with medical history significant for CAD s/p stent in 2022, HTN, dementia, history of urinary tract infection, HLD, history of hyponatremia who was recently diagnosed having UTI and was given oral cephalosporin antibiotic for UTI.  She received 1 dose but she continued to have brief syncopal episode at home.  Clinical Impression  Pt received seated in recliner upon arrival to room and pt agreeable to therapy.  Pt's daughter present throughout the session.  Pt performed well with mobility and was able to ambulate with the use of the RW well.  Pt ambulated around the nursing station x2 laps before returning to the recliner.  Pt notes she feels a lot less dizzy with mobilization and transfers than she did prior to coming to the hospital.  Pt, daughter, and therapist discussed HHPT as a means to improve overall tolerance to exercise and improve strength.  Pt is open to this following discharge.  Pt left in recliner with all needs met and call bell within reach.          If plan is discharge home, recommend the following: A little help with walking and/or transfers;A little help with bathing/dressing/bathroom;Help with stairs or ramp for entrance;Assist for transportation   Can travel by private vehicle        Equipment Recommendations None recommended by PT  Recommendations for Other Services       Functional Status Assessment Patient has had a recent decline in their functional status and demonstrates the ability to make significant improvements in function in a reasonable and predictable amount of time.     Precautions / Restrictions Precautions Precautions: Fall Recall of Precautions/Restrictions: Intact Restrictions Weight Bearing Restrictions Per Provider Order: No      Mobility  Bed Mobility                General bed mobility comments: Pt upright in recliner upon arrival.    Transfers Overall transfer level: Needs assistance Equipment used: Rolling walker (2 wheels) Transfers: Sit to/from Stand Sit to Stand: Contact guard assist, From elevated surface           General transfer comment: Pt with good recall on how to stand with use of the RW and the recliner arms.    Ambulation/Gait Ambulation/Gait assistance: Supervision, Contact guard assist Gait Distance (Feet): 320 Feet Assistive device: Rolling walker (2 wheels) Gait Pattern/deviations: WFL(Within Functional Limits), Trunk flexed Gait velocity: decreased     General Gait Details: Pt with good mobility, noting to have forward flexed posture during standing/ambulation however.  Pt notes she felt better and less dizzy today during sessions with therapy than she had been feeling prior to hospital visit.  Stairs            Wheelchair Mobility     Tilt Bed    Modified Rankin (Stroke Patients Only)       Balance Overall balance assessment: Needs assistance Sitting-balance support: Feet supported, No upper extremity supported Sitting balance-Leahy Scale: Good Sitting balance - Comments: Steady reaching with BOS   Standing balance support: During functional activity, Bilateral upper extremity supported Standing balance-Leahy Scale: Fair Standing balance comment: Pt with good mobility utilizing the RW and maintained control of it throughout the session.  Pertinent Vitals/Pain Pain Assessment Pain Assessment: No/denies pain    Home Living Family/patient expects to be discharged to:: Private residence Living Arrangements: Children Available Help at Discharge: Family;Available 24 hours/day Type of Home: House Home Access: Ramped entrance       Home Layout: One level Home Equipment: Rolling Walker (2 wheels);Grab bars - toilet;Grab bars - tub/shower;Shower seat       Prior Function Prior Level of Function : Independent/Modified Independent             Mobility Comments: Rolling walker at baseline ADLs Comments: In house family members cook, Pt assists them. Pt reports she allows her family to drive her around. Pt reports she manages her own medication.     Extremity/Trunk Assessment   Upper Extremity Assessment Upper Extremity Assessment: Overall WFL for tasks assessed    Lower Extremity Assessment Lower Extremity Assessment: Generalized weakness       Communication   Communication Communication: No apparent difficulties    Cognition Arousal: Alert Behavior During Therapy: WFL for tasks assessed/performed                             Following commands: Intact       Cueing Cueing Techniques: Verbal cues     General Comments General comments (skin integrity, edema, etc.): Daughter presuent throughout the session.    Exercises     Assessment/Plan    PT Assessment Patient needs continued PT services  PT Problem List Decreased strength;Decreased activity tolerance;Decreased balance;Decreased mobility;Decreased knowledge of use of DME;Decreased safety awareness       PT Treatment Interventions DME instruction;Gait training;Functional mobility training;Therapeutic activities;Therapeutic exercise;Neuromuscular re-education;Balance training    PT Goals (Current goals can be found in the Care Plan section)  Acute Rehab PT Goals Patient Stated Goal: to return home. PT Goal Formulation: With patient/family Time For Goal Achievement: 01/03/24 Potential to Achieve Goals: Good    Frequency Min 2X/week     Co-evaluation               AM-PAC PT 6 Clicks Mobility  Outcome Measure Help needed turning from your back to your side while in a flat bed without using bedrails?: A Little Help needed moving from lying on your back to sitting on the side of a flat bed without using bedrails?: A Little Help needed  moving to and from a bed to a chair (including a wheelchair)?: A Little Help needed standing up from a chair using your arms (e.g., wheelchair or bedside chair)?: A Little Help needed to walk in hospital room?: A Little Help needed climbing 3-5 steps with a railing? : A Little 6 Click Score: 18    End of Session Equipment Utilized During Treatment: Gait belt Activity Tolerance: Patient tolerated treatment well Patient left: in bed Nurse Communication: Mobility status PT Visit Diagnosis: Unsteadiness on feet (R26.81);Other abnormalities of gait and mobility (R26.89);Muscle weakness (generalized) (M62.81);Difficulty in walking, not elsewhere classified (R26.2)    Time: 4098-1191 PT Time Calculation (min) (ACUTE ONLY): 11 min   Charges:   PT Evaluation $PT Eval Low Complexity: 1 Low   PT General Charges $$ ACUTE PT VISIT: 1 Visit         Rozanna Corner, PT, DPT Physical Therapist - [facility]  12/20/23, 3:47 PM

## 2023-12-20 NOTE — TOC CM/SW Note (Signed)
 Transition of Care [facility]) - Inpatient Brief Assessment   Patient Details  Name: Alexis Perez MRN: [id number] Date of Birth: 27-Feb-1924  [facility] CM/SW Contact:    Odilia Bennett, LCSW Phone Number: 12/20/2023, 1:28 PM   Clinical Narrative: CSW reviewed chart. No TOC needs identified so far. PT eval pending. OT recommended no follow up/DME. CSW will continue to follow progress. Please place [facility] consult if any needs arise.  Transition of Care Asessment: Insurance and Status: Insurance coverage has been reviewed Patient has primary care physician: Yes Home environment has been reviewed: Single family home Prior level of function:: Modified independent Prior/Current Home Services: No current home services Social Drivers of Health Review: SDOH reviewed interventions complete Readmission risk has been reviewed: Yes Transition of care needs: no transition of care needs at this time

## 2023-12-20 NOTE — Care Management Important Message (Signed)
 Important Message  Patient Details  Name: Alexis Perez MRN: [id number] Date of Birth: 03-21-1924   Important Message Given:  Yes - Medicare IM     Trevaughn Schear W, CMA 12/20/2023, 11:54 AM

## 2023-12-20 NOTE — Progress Notes (Addendum)
 Rounding Note   Patient Name: Alexis Perez Date of Encounter: 12/20/2023  [facility] Cardiologist: None   Subjective Seen on rounds. Denies any chest pain or shortness of breath. No over night events have been recorded.   Scheduled Meds:  amLODipine   5 mg Oral Daily   aspirin  EC  81 mg Oral Daily   clopidogrel   75 mg Oral Daily   enoxaparin (LOVENOX) injection  40 mg Subcutaneous Q24H   ezetimibe   10 mg Oral QHS   isosorbide  mononitrate  120 mg Oral Daily   metoprolol  succinate  100 mg Oral QHS   potassium chloride   40 mEq Oral Q4H   Continuous Infusions:   ceFAZolin (ANCEF) IV     PRN Meds: acetaminophen  **OR** acetaminophen , HYDROcodone-acetaminophen , HYDROmorphone (DILAUDID) injection, nitroGLYCERIN , ondansetron  **OR** ondansetron  (ZOFRAN ) IV, polyethylene glycol   Vital Signs  Vitals:   12/19/23 2032 12/20/23 0129 12/20/23 0441 12/20/23 0751  BP: (!) 155/74 (!) 159/83 (!) 172/69 (!) 162/93  Pulse: 91 84 85 88  Resp: 18 18 18 18   Temp: 98.5 F (36.9 C) 97.7 F (36.5 C) 99 F (37.2 C) 98 F (36.7 C)  TempSrc:      SpO2: 98% 98% 96% 95%  Weight:      Height:        Intake/Output Summary (Last 24 hours) at 12/20/2023 1117 Last data filed at 12/19/2023 2252 Gross per 24 hour  Intake 720 ml  Output 1700 ml  Net -980 ml      12/17/2023   10:16 PM 10/11/2021    2:10 AM 10/03/2021   12:27 AM  Last 3 Weights  Weight (lbs) 131 lb 132 lb 132 lb  Weight (kg) 59.421 kg 59.875 kg 59.875 kg      Telemetry Sinus with occasional unifocal PVC's - Personally Reviewed  ECG  No new tracings - Personally Reviewed  Physical Exam  GEN: No acute distress.   Neck: No JVD Cardiac: RRR, II/VI systolic murmur, without rubs or gallops.  Respiratory: Clear to auscultation bilaterally. GI: Soft, nontender, non-distended  MS: Trace edema BLE, left greater than right; No deformity. Neuro:  Nonfocal  Psych: Normal affect   Labs High Sensitivity Troponin:    Recent Labs  Lab 12/17/23 1813 12/17/23 2009 12/17/23 2314 12/18/23 0055  TROPONINIHS 174* 571* 2,751* 4,599*     Chemistry Recent Labs  Lab 12/17/23 1813 12/18/23 0706 12/19/23 0942  NA 128* 132* 131*  K 3.6 3.0* 3.0*  CL 92* 94* 94*  CO2 25 28 26   GLUCOSE 123* 123* 117*  BUN 22 14 15   CREATININE 0.56 0.50 0.56  CALCIUM  8.9 8.9 8.5*  MG  --   --  1.8  PROT 6.7 6.3*  --   ALBUMIN 3.4* 3.2*  --   AST 22 46*  --   ALT 13 16  --   ALKPHOS 51 48  --   BILITOT 1.1 0.8  --   GFRNONAA >60 >60 >60  ANIONGAP 11 10 11     Lipids No results for input(s): CHOL, TRIG, HDL, LABVLDL, LDLCALC, CHOLHDL in the last 168 hours.  Hematology Recent Labs  Lab 12/17/23 1813 12/18/23 0706 12/19/23 0206  WBC 10.2 7.7 7.0  RBC 3.80* 3.97 3.99  HGB 11.7* 12.2 12.3  HCT 35.0* 36.3 36.7  MCV 92.1 91.4 92.0  MCH 30.8 30.7 30.8  MCHC 33.4 33.6 33.5  RDW 14.5 14.5 14.5  PLT 187 168 169   Thyroid No results for input(s): TSH, FREET4 in  the last 168 hours.  BNPNo results for input(s): BNP, PROBNP in the last 168 hours.  DDimer No results for input(s): DDIMER in the last 168 hours.   Radiology  ECHOCARDIOGRAM COMPLETE Result Date: 12/19/2023    ECHOCARDIOGRAM REPORT   Patient Name:   Alexis Perez Date of Exam: 12/19/2023 Medical Rec #:  [id number]     Height:       60.0 in Accession #:    [id number]    Weight:       131.0 lb Date of Birth:  03-24-1924      BSA:          1.559 m Patient Age:    89 years      BP:           161/77 mmHg Patient Gender: F             HR:           92 bpm. Exam Location:  [facility] Procedure: 2D Echo, Cardiac Doppler and Color Doppler (Both Spectral and Color            Flow Doppler were utilized during procedure). Indications:     Aortic valve disorder I35.9  History:         Patient has prior history of Echocardiogram examinations, most                  recent 10/04/2021. Risk Factors:Hypertension.  Sonographer:     Broadus Canes Referring Phys:  [id number]  Devorah Fonder Diagnosing Phys: Belva Boyden MD IMPRESSIONS  1. Left ventricular ejection fraction, by estimation, is 60 to 65%. The left ventricle has normal function. The left ventricle has no regional wall motion abnormalities. There is moderate left ventricular hypertrophy. Left ventricular diastolic parameters are consistent with Grade I diastolic dysfunction (impaired relaxation).  2. Right ventricular systolic function is normal. The right ventricular size is normal. There is normal pulmonary artery systolic pressure. The estimated right ventricular systolic pressure is 26.9 mmHg.  3. The mitral valve is normal in structure. No evidence of mitral valve regurgitation. No evidence of mitral stenosis. Moderate mitral annular calcification.  4. The aortic valve is calcified. There is moderate calcification of the aortic valve. Aortic valve regurgitation is not visualized. Moderate aortic valve stenosis. Aortic valve area, by VTI measures 1.83 cm. Aortic valve mean gradient measures 15.0 mmHg. Aortic valve Vmax measures 2.49 m/s.  5. The inferior vena cava is normal in size with greater than 50% respiratory variability, suggesting right atrial pressure of 3 mmHg. FINDINGS  Left Ventricle: Left ventricular ejection fraction, by estimation, is 60 to 65%. The left ventricle has normal function. The left ventricle has no regional wall motion abnormalities. Strain was performed and the global longitudinal strain is indeterminate. The left ventricular internal cavity size was normal in size. There is moderate left ventricular hypertrophy. Left ventricular diastolic parameters are consistent with Grade I diastolic dysfunction (impaired relaxation). Right Ventricle: The right ventricular size is normal. No increase in right ventricular wall thickness. Right ventricular systolic function is normal. There is normal pulmonary artery systolic pressure. The tricuspid regurgitant velocity is 2.34 m/s, and  with an assumed  right atrial pressure of 5 mmHg, the estimated right ventricular systolic pressure is 26.9 mmHg. Left Atrium: Left atrial size was normal in size. Right Atrium: Right atrial size was normal in size. Pericardium: There is no evidence of pericardial effusion. Mitral Valve: The mitral valve is normal in structure. There is mild calcification  of the mitral valve leaflet(s). Moderate mitral annular calcification. No evidence of mitral valve regurgitation. No evidence of mitral valve stenosis. MV peak gradient, 12.7 mmHg. The mean mitral valve gradient is 6.0 mmHg. Tricuspid Valve: The tricuspid valve is normal in structure. Tricuspid valve regurgitation is not demonstrated. No evidence of tricuspid stenosis. Aortic Valve: The aortic valve is calcified. There is moderate calcification of the aortic valve. Aortic valve regurgitation is not visualized. Moderate aortic stenosis is present. Aortic valve mean gradient measures 15.0 mmHg. Aortic valve peak gradient  measures 24.9 mmHg. Aortic valve area, by VTI measures 1.83 cm. Pulmonic Valve: The pulmonic valve was normal in structure. Pulmonic valve regurgitation is not visualized. No evidence of pulmonic stenosis. Aorta: The aortic root is normal in size and structure. Venous: The inferior vena cava is normal in size with greater than 50% respiratory variability, suggesting right atrial pressure of 3 mmHg. IAS/Shunts: No atrial level shunt detected by color flow Doppler. Additional Comments: 3D was performed not requiring image post processing on an independent workstation and was indeterminate.  LEFT VENTRICLE PLAX 2D LVIDd:         3.80 cm   Diastology LVIDs:         2.10 cm   LV e' medial:    3.59 cm/s LV PW:         0.90 cm   LV E/e' medial:  23.6 LV IVS:        1.40 cm   LV e' lateral:   3.59 cm/s LVOT diam:     2.00 cm   LV E/e' lateral: 23.6 LV SV:         76 LV SV Index:   49 LVOT Area:     3.14 cm  RIGHT VENTRICLE RV Basal diam:  3.40 cm RV Mid diam:    3.70 cm  RV S prime:     16.30 cm/s TAPSE (M-mode): 1.7 cm LEFT ATRIUM             Index        RIGHT ATRIUM          Index LA diam:        4.30 cm 2.76 cm/m   RA Area:     9.74 cm LA Vol (A2C):   40.5 ml 25.97 ml/m  RA Volume:   16.70 ml 10.71 ml/m LA Vol (A4C):   46.2 ml 29.63 ml/m LA Biplane Vol: 47.2 ml 30.27 ml/m  AORTIC VALVE AV Area (Vmax):    1.74 cm AV Area (Vmean):   1.90 cm AV Area (VTI):     1.83 cm AV Vmax:           249.33 cm/s AV Vmean:          182.333 cm/s AV VTI:            0.415 m AV Peak Grad:      24.9 mmHg AV Mean Grad:      15.0 mmHg LVOT Vmax:         138.00 cm/s LVOT Vmean:        110.000 cm/s LVOT VTI:          0.242 m LVOT/AV VTI ratio: 0.58  AORTA Ao Root diam: 3.30 cm MITRAL VALVE                TRICUSPID VALVE MV Area (PHT): 4.80 cm     TR Peak grad:   21.9 mmHg MV Area VTI:   2.42 cm  TR Vmax:        234.00 cm/s MV Peak grad:  12.7 mmHg MV Mean grad:  6.0 mmHg     SHUNTS MV Vmax:       1.78 m/s     Systemic VTI:  0.24 m MV Vmean:      110.0 cm/s   Systemic Diam: 2.00 cm MV Decel Time: 158 msec MV E velocity: 84.90 cm/s MV A velocity: 140.00 cm/s MV E/A ratio:  0.61 Belva Boyden MD Electronically signed by Belva Boyden MD Signature Date/Time: 12/19/2023/12:55:26 PM    Final    NM Renal Imaging Flow W/Pharm Result Date: 12/18/2023 CLINICAL DATA:  Right hydronephrosis, urinary tract infection, sepsis EXAM: NUCLEAR MEDICINE RENAL SCAN WITH DIURETIC ADMINISTRATION TECHNIQUE: Radionuclide angiographic and sequential renal images were obtained after intravenous injection of radiopharmaceutical. Imaging was continued during slow intravenous injection of Lasix approximately 15 minutes after the start of the examination. RADIOPHARMACEUTICALS:  4.99 mCi Technetium-86m MAG3 IV COMPARISON:  12/17/2023 FINDINGS: Flow: Asymmetric decreased radiotracer uptake within the right kidney relative to the left on flow phase of the exam. Left renogram: Normal left radiographic curve. Right  renogram: Blunted peak of the right renal graphic curve, otherwise normal appearance. Differential: Left kidney = 59.8 % Right kidney = 40.2 % T1/2 post Lasix : Left kidney = 3.8 min Right kidney = 20.7 min IMPRESSION: 1. 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 2. Normal left renal function. Electronically Signed   By: Bobbye Burrow M.D.   On: 12/18/2023 20:23    Cardiac Studies 2D echo 12/19/2023 1. Left ventricular ejection fraction, by estimation, is 60 to 65%. The  left ventricle has normal function. The left ventricle has no regional  wall motion abnormalities. There is moderate left ventricular hypertrophy.  Left ventricular diastolic  parameters are consistent with Grade I diastolic dysfunction (impaired  relaxation).   2. Right ventricular systolic function is normal. The right ventricular  size is normal. There is normal pulmonary artery systolic pressure. The  estimated right ventricular systolic pressure is 26.9 mmHg.   3. The mitral valve is normal in structure. No evidence of mitral valve  regurgitation. No evidence of mitral stenosis. Moderate mitral annular  calcification.   4. The aortic valve is calcified. There is moderate calcification of the  aortic valve. Aortic valve regurgitation is not visualized. Moderate  aortic valve stenosis. Aortic valve area, by VTI measures 1.83 cm. Aortic  valve mean gradient measures 15.0  mmHg. Aortic valve Vmax measures 2.49 m/s.   5. The inferior vena cava is normal in size with greater than 50%  respiratory variability, suggesting right atrial pressure of 3 mmHg.    Patient Profile   89 y.o. female with a past medical history of coronary disease, clear RCA, prior stenting in 2023 to the left circumflex, severe residual mid LAD disease,  dementia, hyponatremia, urinary tract infection presenting with encephalopathy, mental status changes, brief syncopal episode, who has been seen and evaluated for elevated high-sensitivity troponin.  Assessment & Plan  NSTEMI -Presented with encephalopathy, UTI, bacteremia/sepsis -Continues to deny angina or anginal equivalent -High-sensitivity troponin greater than 4000, marked abnormal EKG on arrival consistent with ischemia -Known disease with occluded RCA, prior stenting to the left circumflex, residual 80% send mid LAD disease -Treated with 48 hours of heparin  infusion -Discussed various  treatment options with family and plan made for medical management -Given lack of symptoms, age, in the setting of bacteremia/sepsis and echocardiogram with normal LV function continue with medical management with no plan for continued ischemic workup at this time -Continued on aspirin , clopidogrel , Imdur , and metoprolol  -EKG as needed for pain or changes  E. coli bacteremia/sepsis -UTI followed by urology  Hyponatremia -Serum sodium -Free water restriction -Currently not on diuretic therapy  Hypokalemia -Serum potassium 3.0 -Supplementation ordered -Daily BMP -Recommend keeping potassium greater than 4 less than 5 -Monitor/trend/replete electrolytes as needed  Aortic stenosis -Noted on echocardiogram as moderate aortic valve stenosis -VTI measuring 1.83 cm, aortic valve mean gradient measuring 15 mmHg, aortic valve V-max measuring 2.49 m/s - Continued on aspirin  and zetia   6.   Hypertension -blood pressure 162/93, prior to medications -continued on amlodipine , imdur , and metoprolol  -vital signs per unit protocol    For questions or updates, please contact [facility] Please consult [email or web address] for contact info under     Signed, SHERI HAMMOCK, NP  12/20/2023, 11:17 AM    I have independently seen and examined the patient and agree with the findings and plan, as  documented in the PA/NP's note, with the following additions/changes.  Patient feels well without chest pain, shortness of breath, palpitations, or edema.  Physical exam notable for regular rate and rhythm with 2/6 systolic murmur.  Lungs are clear.  Abdomen soft and nontender.  Trace left pretibial edema noted, which the patient reports is chronic after prior injury.  Patient is not on telemetry at this time.  From a heart standpoint, she is stable in the setting of elevated high-sensitivity troponin I with concurrent encephalopathy and sepsis related to UTI/bacteremia.  Given reassuring echo with preserved LVEF and lack of chest pain, I suspect her elevated troponin reflects supply-demand mismatch.  She is known to have CTO of the RCA, prior PCI to the LCx, and severe residual LAD disease.  Given her advanced age and lack of symptoms, patient and her family confirm that they do not wish to pursue any invasive procedures.  Ms. Mihalic is already completed 48 hours of heparin .  Continue DAPT with aspirin  and clopidogrel  for up to 12 months as long as she has not had any bleeding.  Continue secondary prevention with ezetimibe ; will resume home dose of rosuvastatin  10 mg daily.  May need escalation of antihypertensive therapy if blood pressure remains elevated; continue current doses of metoprolol , isosorbide , and amlodipine  for now.  Ongoing management of UTI/bacteremia per primary team.  Sammy Crisp, MD [facility]

## 2023-12-20 NOTE — Consult Note (Signed)
 Urology Consult  Requesting physician: Verla Glaze, MD  Reason for consultation: Right hydronephrosis with UTI/bacteremia   Assessment/Recommendations:  1.  Right UPJ obstruction with UTI Asymptomatic Differential renal function on Lasix scan ~60 L/40 R.  Markedly dilated renal pelvis and based on renal function unlikely to have significant obstruction Has responded well to antibiotic therapy and afebrile last 48 hours Due to her response to antibiotic therapy and lack of symptoms would not recommend ureteral stent placement at this time.  We discussed once a stent was placed it would be long-term and does require periodic changes.  The option of percutaneous nephrostomy tube placement with subsequent conversion to a nephroureteral catheter would be easier to manage and change however she would need to be off Plavix /ASA prior to initial placement Recommend urology follow-up ~1 month post discharge   History of Present Illness: Alexis Perez is a 89 y.o. female admitted 12/17/2023 with a 3-day history of intermittent syncopal episodes.  Her daughter was with her when seen and added to the history.   Evaluation in the ED consistent with NSTEMI.  Temp was 100.6 though she had no voiding complaints or flank/abdominal pain.  CT abdomen/pelvis with contrast remarkable for moderate-severe right hydronephrosis with some parenchymal thinning and consistent with UPJ obstruction.  Equal/bilateral nephrograms were noted.  UA with >50 WBC with normal lactate and white count.  Urine culture with no growth however blood culture positive E. coli.  Lasix renogram with differential function 60% left/40% right.  Delay of tracer clearance on the right felt consistent with obstruction.  After admission minimal temp to 99 degrees and she remains asymptomatic and states she is feeling much better.  On Plavix /ASA and heparin  drip presently  Past Medical History:  Diagnosis Date   Hypertension     Past  Surgical History:  Procedure Laterality Date   ANKLE FRACTURE SURGERY Left 1999    Home Medications:  Current Meds  Medication Sig   amLODipine  (NORVASC ) 5 MG tablet Take 5 mg by mouth daily.   aspirin  EC 81 MG tablet Take 81 mg by mouth daily. Swallow whole.   cefdinir (OMNICEF) 300 MG capsule Take 300 mg by mouth 2 (two) times daily.   Cholecalciferol  50 MCG (2000 UT) CAPS Take 1 capsule by mouth daily.   ezetimibe  (ZETIA ) 10 MG tablet Take 10 mg by mouth at bedtime.   isosorbide  mononitrate (IMDUR ) 30 MG 24 hr tablet Take 120 mg by mouth daily.   metoprolol  succinate (TOPROL -XL) 50 MG 24 hr tablet Take 100 mg by mouth at bedtime.   Multiple Vitamins-Minerals (PRESERVISION AREDS 2) CAPS Take 1 capsule by mouth daily.   nitroGLYCERIN  (NITROSTAT ) 0.4 MG SL tablet Place under the tongue.   rosuvastatin  (CRESTOR ) 10 MG tablet Take 10 mg by mouth once a week.   [DISCONTINUED] rosuvastatin  (CRESTOR ) 10 MG tablet Take 1 tablet (10 mg total) by mouth at bedtime.    Allergies:  Allergies  Allergen Reactions   Fentanyl Nausea And Vomiting   Sodium Pentobarbital [Pentobarbital] Anaphylaxis    When pt was sedated for surgery   Hydrochlorothiazide Other (See Comments)    hyponatremia   Ciprofloxacin      Blurry vision, sleepy and make pt sick   Codeine Other (See Comments)    Other reaction(s): UNKNOWN   Penicillin G Rash   Sulfa Antibiotics Rash    History reviewed. No pertinent family history.  Social History:  reports that she has never smoked. She has never used smokeless tobacco. She  reports that she does not drink alcohol  and does not use drugs.  ROS: A complete review of systems was performed.  All systems are negative except for pertinent findings as noted.  Physical Exam:  Vital signs in last 24 hours: Temp:  [97.7 F (36.5 C)-99 F (37.2 C)] 99 F (37.2 C) (06/20 0441) Pulse Rate:  [84-101] 85 (06/20 0441) Resp:  [18] 18 (06/20 0441) BP: (104-172)/(64-83) 172/69  (06/20 0441) SpO2:  [95 %-98 %] 96 % (06/20 0441) Constitutional:  Alert, No acute distress HEENT: [place] AT Respiratory: Normal respiratory effort Psychiatric: Normal mood and affect   Laboratory Data:  Recent Labs    12/17/23 1813 12/18/23 0706 12/19/23 0206  WBC 10.2 7.7 7.0  HGB 11.7* 12.2 12.3  HCT 35.0* 36.3 36.7   Recent Labs    12/17/23 1813 12/18/23 0706 12/19/23 0942  NA 128* 132* 131*  K 3.6 3.0* 3.0*  CL 92* 94* 94*  CO2 25 28 26   GLUCOSE 123* 123* 117*  BUN 22 14 15   CREATININE 0.56 0.50 0.56  CALCIUM  8.9 8.9 8.5*   Recent Labs    12/17/23 1813 12/18/23 0706  INR 1.1 1.2   No results for input(s): LABURIN in the last 72 hours. Results for orders placed or performed during the hospital encounter of 12/17/23  Blood Culture (routine x 2)     Status: None (Preliminary result)   Collection Time: 12/17/23  6:13 PM   Specimen: BLOOD  Result Value Ref Range Status   Specimen Description   Final    BLOOD RIGHT ANTECUBITAL Performed at [facility], [address] [place], [place] [address]    Special Requests   Final    BOTTLES DRAWN AEROBIC AND ANAEROBIC Blood Culture results may not be optimal due to an inadequate volume of blood received in culture bottles Performed at [facility], [address] [place], [place] [address]    Culture  Setup Time   Final    GRAM NEGATIVE RODS AEROBIC BOTTLE ONLY CRITICAL RESULT CALLED TO, READ BACK BY AND VERIFIED WITH: ALEX CHAPPEL 12/18/23 0745 MW GRAM STAIN REVIEWED-AGREE WITH RESULT DRT Performed at [facility], [address] [place], [place] [address]    Culture GRAM NEGATIVE RODS  Final   Report Status PENDING  Incomplete  Blood Culture (routine x 2)     Status: Abnormal (Preliminary result)   Collection Time: 12/17/23  6:13 PM   Specimen: BLOOD  Result Value Ref Range Status   Specimen Description   Final    BLOOD LEFT ANTECUBITAL Performed at [facility], [address] [place], [place] [address]    Special Requests   Final    BOTTLES DRAWN AEROBIC AND ANAEROBIC Blood Culture results may not be optimal due to an inadequate volume of blood received in culture bottles Performed at [facility], [address] [place], [place] [address]    Culture  Setup Time   Final    GRAM NEGATIVE RODS ANAEROBIC BOTTLE ONLY Organism ID to follow CRITICAL RESULT CALLED TO, READ BACK BY AND VERIFIED WITH: ALEX CHAPPEL 12/18/23 GRAM STAIN REVIEWED-AGREE WITH RESULT DRT Performed at [facility], [address] [place], [place] [address]    Culture ESCHERICHIA COLI (A)  Final   Report Status PENDING  Incomplete  Blood Culture ID Panel (Reflexed)     Status: Abnormal   Collection Time: 12/17/23  6:13 PM  Result Value Ref Range Status  Enterococcus faecalis NOT DETECTED NOT DETECTED Final   Enterococcus Faecium NOT DETECTED NOT DETECTED Final   Listeria monocytogenes NOT DETECTED NOT DETECTED Final   Staphylococcus species NOT DETECTED NOT DETECTED Final   Staphylococcus aureus (BCID) NOT DETECTED NOT DETECTED Final   Staphylococcus epidermidis NOT DETECTED NOT DETECTED Final   Staphylococcus lugdunensis NOT DETECTED NOT DETECTED Final   Streptococcus species NOT DETECTED NOT DETECTED Final   Streptococcus agalactiae NOT DETECTED NOT DETECTED Final   Streptococcus pneumoniae NOT DETECTED NOT DETECTED Final   Streptococcus pyogenes NOT DETECTED NOT DETECTED Final   A.calcoaceticus-baumannii NOT DETECTED NOT DETECTED Final   Bacteroides fragilis NOT DETECTED NOT DETECTED Final   Enterobacterales DETECTED (A) NOT DETECTED Final    Comment: Enterobacterales represent a large order of gram negative bacteria, not a single organism. CRITICAL RESULT CALLED TO, READ BACK BY AND VERIFIED WITH: ALEX CHAPEL 12/18/23 0745 MW    Enterobacter cloacae complex NOT DETECTED NOT DETECTED Final   Escherichia coli DETECTED (A) NOT DETECTED Final     Comment: CRITICAL RESULT CALLED TO, READ BACK BY AND VERIFIED WITH: ALEX CHAPEL 12/18/23 0745 MW    Klebsiella aerogenes NOT DETECTED NOT DETECTED Final   Klebsiella oxytoca NOT DETECTED NOT DETECTED Final   Klebsiella pneumoniae NOT DETECTED NOT DETECTED Final   Proteus species NOT DETECTED NOT DETECTED Final   Salmonella species NOT DETECTED NOT DETECTED Final   Serratia marcescens NOT DETECTED NOT DETECTED Final   Haemophilus influenzae NOT DETECTED NOT DETECTED Final   Neisseria meningitidis NOT DETECTED NOT DETECTED Final   Pseudomonas aeruginosa NOT DETECTED NOT DETECTED Final   Stenotrophomonas maltophilia NOT DETECTED NOT DETECTED Final   Candida albicans NOT DETECTED NOT DETECTED Final   Candida auris NOT DETECTED NOT DETECTED Final   Candida glabrata NOT DETECTED NOT DETECTED Final   Candida krusei NOT DETECTED NOT DETECTED Final   Candida parapsilosis NOT DETECTED NOT DETECTED Final   Candida tropicalis NOT DETECTED NOT DETECTED Final   Cryptococcus neoformans/gattii NOT DETECTED NOT DETECTED Final   CTX-M ESBL NOT DETECTED NOT DETECTED Final   Carbapenem resistance IMP NOT DETECTED NOT DETECTED Final   Carbapenem resistance KPC NOT DETECTED NOT DETECTED Final   Carbapenem resistance NDM NOT DETECTED NOT DETECTED Final   Carbapenem resist OXA 48 LIKE NOT DETECTED NOT DETECTED Final   Carbapenem resistance VIM NOT DETECTED NOT DETECTED Final    Comment: Performed at [facility], [address], [place], [place] [address]  Urine Culture     Status: None   Collection Time: 12/17/23  8:09 PM   Specimen: Urine, Random  Result Value Ref Range Status   Specimen Description   Final    URINE, RANDOM Performed at [facility], [address], [place], [place] [address]    Special Requests   Final    NONE Reflexed from [id number] Performed at [facility], [address] [place], [place] [address]    Culture   Final    NO GROWTH Performed at [facility], [address] [place], [place] [address]    Report Status 12/18/2023 FINAL  Final     Radiologic Imaging: CT/Lasix renogram were personally reviewed and interpreted   12/20/2023, 7:31 AM  Darlynn Elam,  MD

## 2023-12-20 NOTE — Progress Notes (Signed)
 Progress Note   Patient: Alexis Perez ZOX:[id number] DOB: [birth year] DOA: 12/17/2023     3 DOS: the patient was seen and examined on 12/20/2023   Brief hospital course: 89 y.o. female with medical history significant for CAD s/p stent in 2022, HTN, dementia, history of urinary tract infection, HLD, history of hyponatremia who was recently diagnosed having UTI and was given oral cephalosporin antibiotic for UTI.  She received 1 dose but she continued to have brief syncopal episode at home.  Patient's daughter Devra Fontana who was concerned and brought in to emergency room for evaluation.  As per the patient's daughter patient had 2 or 3 episodes of very brief passing out event but she would be normal after that.  There was no post syncopal confusion.  There was no fall or loss of urine or bladder.  The patient had a fever but they did not check the temperature at home.  Patient does not have nausea vomiting abdominal pain chest pain shortness of breath palpitations.  She denies hitting her head or any trauma.   ED Course: Upon arrival to the ED, patient is found to have elevated troponin and positive urinary tract infection.  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  Urologist evaluated the patient and advised for treating UTI at this point.  Cardiologist Dr. Nolan Battle was contacted by ED for elevated troponin.  Dr. Nolan Battle advised to start on heparin  drip and treat medically at this point.  Hospitalist service was consulted for evaluation for admission for possible sepsis  6/18.  Blood cultures positive for E. coli.  Patient not having any chest pain or shortness of breath.  On empiric heparin  drip for NSTEMI.  Increase Rocephin  up to 2 g daily. 6/19.  Renal imaging nuclear medicine study shows asymmetric decreased radiotracer uptake within the right kidney during the flow phase of the exam with blunted right radiographic curve and delayed response to Lasix consistent with obstructive  uropathy and UPJ stenosis seen on recent CT scan.  Case discussed with urology and urology spoke with family and decided on conservative management.  Echocardiogram shows normal EF. 6/20.  E. coli is pansensitive.  Will switch Rocephin  over to Ancef and likely home tomorrow on high-dose p.o. Keflex or Duricef.  Assessment and Plan: * Sepsis due to Escherichia coli (E. coli) (HCC) Present on admission.  Since E. coli is pansensitive we will switch Rocephin  over to Ancef.  Likely home tomorrow on high-dose Duricef or Keflex.  Patient did have fever and tachycardia which meets SIRS criteria on admission.  Source is urinary infection with obstruction  Non-ST elevation (NSTEMI) myocardial infarction [facility]) Conservative management.  Continue aspirin , Plavix , Imdur , metoprolol .  Completed heparin  drip last night.  Echocardiogram shows normal EF.  Hydronephrosis, right Urology discussed case with family and decided on conservative management since patient improved.  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  Creatinine stable.  Essential hypertension Continue metoprolol   Hypokalemia Replaced  Hyponatremia Sodium 131.  Continue to monitor  Syncope and collapse Likely secondary to sepsis        Subjective: Patient feeling well today.  Walked to the bathroom with occupational therapy.  No chest pain or shortness of breath.  Received sensitivities on the bacteria in the blood culture and it is sensitive to Rocephin  and Ancef.  Physical Exam: Vitals:   12/19/23 2032 12/20/23 0129 12/20/23 0441 12/20/23 0751  BP: (!) 155/74 (!) 159/83 (!) 172/69 (!) 162/93  Pulse: 91 84 85 88  Resp: 18 18 18 18   Temp: 98.5 F (36.9 C) 97.7 F (36.5 C) 99 F (37.2 C) 98 F (36.7 C)  TempSrc:      SpO2: 98% 98% 96% 95%  Weight:      Height:       Physical Exam HENT:     Head: Normocephalic.     Mouth/Throat:     Pharynx: No oropharyngeal exudate.   Eyes:     General: Lids are normal.      Conjunctiva/sclera: Conjunctivae normal.    Cardiovascular:     Rate and Rhythm: Normal rate and regular rhythm.     Heart sounds: S1 normal and S2 normal. Murmur heard.     Systolic murmur is present with a grade of 2/6.  Pulmonary:     Breath sounds: No decreased breath sounds, wheezing, rhonchi or rales.  Abdominal:     Palpations: Abdomen is soft.     Tenderness: There is no abdominal tenderness.   Musculoskeletal:     Right lower leg: No swelling.     Left lower leg: No swelling.   Skin:    General: Skin is warm.     Findings: No rash.   Neurological:     Mental Status: She is alert and oriented to person, place, and time.     Data Reviewed: No labs today  Family Communication: Daughter at bedside  Disposition: Status is: Inpatient Remains inpatient appropriate because: Will continue IV antibiotics today and potential discharge home tomorrow if still feeling good  Planned Discharge Destination: Home with Home Health    Time spent: 28 minutes  Author: Verla Glaze, MD 12/20/2023 12:00 PM  For on call review [email or web address].

## 2023-12-20 NOTE — Plan of Care (Signed)
?  Problem: Health Behavior/Discharge Planning: ?Goal: Ability to manage health-related needs will improve ?Outcome: Progressing ?  ?Problem: Nutrition: ?Goal: Adequate nutrition will be maintained ?Outcome: Progressing ?  ?Problem: Safety: ?Goal: Ability to remain free from injury will improve ?Outcome: Progressing ?  ?Problem: Skin Integrity: ?Goal: Risk for impaired skin integrity will decrease ?Outcome: Progressing ?  ?

## 2023-12-21 DIAGNOSIS — I214 Non-ST elevation (NSTEMI) myocardial infarction: Secondary | ICD-10-CM | POA: Diagnosis not present

## 2023-12-21 DIAGNOSIS — A4151 Sepsis due to Escherichia coli [E. coli]: Secondary | ICD-10-CM | POA: Diagnosis not present

## 2023-12-21 DIAGNOSIS — N1330 Unspecified hydronephrosis: Secondary | ICD-10-CM | POA: Diagnosis not present

## 2023-12-21 DIAGNOSIS — I10 Essential (primary) hypertension: Secondary | ICD-10-CM | POA: Diagnosis not present

## 2023-12-21 LAB — BASIC METABOLIC PANEL WITH GFR
Anion gap: 9 (ref 5–15)
BUN: 19 mg/dL (ref 8–23)
CO2: 24 mmol/L (ref 22–32)
Calcium: 8.9 mg/dL (ref 8.9–10.3)
Chloride: 100 mmol/L (ref 98–111)
Creatinine, Ser: 0.64 mg/dL (ref 0.44–1.00)
GFR, Estimated: >60 mL/min (ref >60)
Glucose, Bld: 133 mg/dL — ABNORMAL HIGH (ref 70–99)
Potassium: 4.5 mmol/L (ref 3.5–5.1)
Sodium: 133 mmol/L — ABNORMAL LOW (ref 135–145)

## 2023-12-21 MED ORDER — CLOPIDOGREL BISULFATE 75 MG PO TABS: 75.0000 mg | ORAL_TABLET | Freq: Every day | ORAL | 0 refills | Status: DC

## 2023-12-21 MED ORDER — METOPROLOL SUCCINATE ER 100 MG PO TB24: 100.0000 mg | ORAL_TABLET | Freq: Every day | ORAL | 0 refills | Status: DC

## 2023-12-21 MED ORDER — POLYETHYLENE GLYCOL 3350 17 G PO PACK
17.0000 g | PACK | Freq: Every day | ORAL | 0 refills | Status: DC | PRN
Start: 2023-12-21 — End: 2024-01-01

## 2023-12-21 MED ORDER — ROSUVASTATIN CALCIUM 10 MG PO TABS: 10.0000 mg | ORAL_TABLET | Freq: Every day | ORAL | 0 refills | Status: DC

## 2023-12-21 MED ORDER — CEPHALEXIN 500 MG PO CAPS: 1000.0000 mg | ORAL_CAPSULE | Freq: Three times a day (TID) | ORAL | 0 refills | Status: AC

## 2023-12-21 NOTE — TOC Initial Note (Signed)
 Transition of Care [facility]) - Initial/Assessment Note    Patient Details  Name: Alexis Perez MRN: [id number] Date of Birth: [birth year]  Transition of [facility]) CM/SW Contact:    Aariz Maish E Dulcie Gammon, LCSW Phone Number: 12/21/2023, 10:40 AM  Clinical Narrative:                 CSW met with patient and daughter [name]) at bedside. Patient to DC home today. HH recommended. Patient states she lives with 3 of her daughters. [place], states she is working on getting a local one. [facility]acy [facility]. Patient has a rolling walker, grab bars, and ramp at home. Patient states she lives in a 1 story accessible home. Daughters provide transport. Patient is agreeable to [facility], declines agency preferences. Referral accepted by Darleene with [facility] who is aware of DC home today.  Expected Discharge Plan: Home w Home Health Services Barriers to Discharge: Continued Medical Work up   Patient Goals and CMS Choice   CMS Medicare.gov Compare Post Acute Care list provided to:: Patient Choice offered to / list presented to : Patient      Expected Discharge Plan and Services       Living arrangements for the past 2 months: Single Family Home Expected Discharge Date: 12/21/23                         HH Arranged: PT, OT HH Agency: [facility] Date [facility] Agency Contacted: 12/21/23   Representative spoke with at [facility] Agency: Darleene  Prior Living Arrangements/Services Living arrangements for the past 2 months: Single Family Home Lives with:: Adult Children Patient language and need for interpreter reviewed:: Yes Do you feel safe going back to the place where you live?: Yes      Need for Family Participation in Patient Care: Yes (Comment) Care giver support system in place?: Yes (comment) Current home services: DME Criminal Activity/Legal Involvement Pertinent to Current Situation/Hospitalization: No - Comment as needed  Activities of Daily Living   ADL Screening  (condition at time of admission) Independently performs ADLs?: Yes (appropriate for developmental age) Is the patient deaf or have difficulty hearing?: Yes Does the patient have difficulty seeing, even when wearing glasses/contacts?: No Does the patient have difficulty concentrating, remembering, or making decisions?: No  Permission Sought/Granted Permission sought to share information with : Facility Medical sales representative, Family Supports Permission granted to share information with : Yes, Verbal Permission Granted     Permission granted to share info w AGENCY: HH  Permission granted to share info w Relationship: daughters     Emotional Assessment       Orientation: : Oriented to Self, Oriented to Place, Oriented to  Time, Oriented to Situation Alcohol  / Substance Use: Not Applicable Psych Involvement: No (comment)  Admission diagnosis:  Syncope and collapse [R55] Hyponatremia [E87.1] Demand ischemia (HCC) [I24.89] Hydronephrosis, right [N13.30] Urinary tract infection, acute [N39.0] Sepsis (HCC) [A41.9] Sepsis, due to unspecified organism, unspecified whether acute organ dysfunction present [facility]) [A41.9] Patient Active Problem List   Diagnosis Date Noted   Demand ischemia (HCC) 12/20/2023   Sepsis due to Escherichia coli (E. coli) (HCC) 12/18/2023   Hypokalemia 12/18/2023   Hydronephrosis, right 12/18/2023   Syncope and collapse 12/17/2023   Hyponatremia 12/17/2023   Urinary tract infection, acute 12/17/2023   Non-ST elevation (NSTEMI) myocardial infarction (HCC) 10/03/2021   Essential hypertension 10/03/2021   Coronary artery disease 10/03/2021   Pyuria  10/03/2021   PCP:  Warren Cramp, MD Pharm[phone] [place] [place] - [address] [place] [address] Phone: [phone] Fax: [phone]     Social Drivers of Health (SDOH) Social History: SDOH Screenings   Food Insecurity: No Food Insecurity (12/18/2023)  Housing: Low Risk   (12/18/2023)  Transportation Needs: No Transportation Needs (12/18/2023)  Utilities: Not At Risk (12/18/2023)  Depression (PHQ2-9): Low Risk  (09/11/2021)  Financial Resource Strain: Low Risk  (12/17/2023)   Received from [facility]  Physical Activity: Sufficiently Active (03/17/2018)   Received from [facility]  Social Connections: Unknown (03/17/2018)   Received from [facility]  Stress: No Stress Concern Present (03/17/2018)   Received from [facility]  Tobacco Use: Low Risk  (12/17/2023)  Health Literacy: Low Risk  (10/08/2020)   Received from [facility]   SDOH Interventions:     Readmission Risk Interventions     No data to display

## 2023-12-21 NOTE — Discharge Summary (Signed)
 Physician Discharge Summary   Patient: Alexis Perez MRN: [id number] DOB: [birth year]  Admit date:     12/17/2023  Discharge date: 12/21/23  Discharge Physician: Charlie Patterson   PCP: Warren Cramp, MD   Recommendations at discharge:   Follow-up PCP 5 days Follow-up cardiology 1-2 weeks Follow-up urology 3 weeks  Discharge Diagnoses: Principal Problem:   Sepsis due to Escherichia coli (E. coli) (HCC) Active Problems:   Non-ST elevation (NSTEMI) myocardial infarction (HCC)   Hydronephrosis, right   Essential hypertension   Coronary artery disease   Syncope and collapse   Hyponatremia   Urinary tract infection, acute   Hypokalemia    Hospital Course: 89 y.o. female with medical history significant for CAD s/p stent in 2022, HTN, dementia, history of urinary tract infection, HLD, history of hyponatremia who was recently diagnosed having UTI and was given oral cephalosporin antibiotic for UTI.  She received 1 dose but she continued to have brief syncopal episode at home.  Patient's daughter Alexis Perez who was concerned and brought in to emergency room for evaluation.  As per the patient's daughter patient had 2 or 3 episodes of very brief passing out event but she would be normal after that.  There was no post syncopal confusion.  There was no fall or loss of urine or bladder.  The patient had a fever but they did not check the temperature at home.  Patient does not have nausea vomiting abdominal pain chest pain shortness of breath palpitations.  She denies hitting her head or any trauma.   ED Course: Upon arrival to the ED, patient is found to have elevated troponin and positive urinary tract infection.  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  Urologist evaluated the patient and advised for treating UTI at this point.  Cardiologist Dr. Mady was contacted by ED for elevated troponin.  Dr. Mady advised to start on heparin  drip and treat medically at this  point.  Hospitalist service was consulted for evaluation for admission for possible sepsis  6/18.  Blood cultures positive for E. coli.  Patient not having any chest pain or shortness of breath.  On empiric heparin  drip for NSTEMI.  Increase Rocephin  up to 2 g daily. 6/19.  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  Case discussed with urology and urology spoke with family and decided on conservative management.  Echocardiogram shows normal EF. 6/20.  E. coli is pansensitive.  Will switch Rocephin  over to Ancef  and likely home tomorrow on high-dose p.o. Keflex or Duricef.  Assessment and Plan: * Sepsis due to Escherichia coli (E. coli) (HCC) Present on admission.  Since E. coli is pansensitive we will switch Rocephin  over to Ancef .  Will discharge home on 9 more days of high-dose Keflex.  Patient did have fever and tachycardia which meets SIRS criteria on admission.  Source is urinary infection with obstruction  Non-ST elevation (NSTEMI) myocardial infarction [place]) Conservative management.  Continue aspirin , Plavix , Imdur , metoprolol .  Completed heparin  drip of 48 hours.  Echocardiogram shows normal EF.  Hydronephrosis, right Urology discussed case with family and decided on conservative management since patient improved.  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  Creatinine stable.  Essential hypertension Continue metoprolol   Hypokalemia Replaced  Hyponatremia Sodium 133.  Continue  to monitor as outpatient  Syncope and collapse Likely secondary to sepsis         Consultants: Cardiology, urology Procedures performed: None Disposition: Home health Diet recommendation:  Cardiac diet DISCHARGE MEDICATION: Allergies as of 12/21/2023        Reactions   Fentanyl Nausea And Vomiting   Sodium Pentobarbital [pentobarbital] Anaphylaxis   When pt was sedated for surgery   Hydrochlorothiazide Other (See Comments)   hyponatremia   Ciprofloxacin     Blurry vision, sleepy and make pt sick   Codeine Other (See Comments)   Other reaction(s): UNKNOWN   Penicillin G Rash   Sulfa Antibiotics Rash        Medication List     STOP taking these medications    Calcium -Vitamin D -Vitamin K 500-100-40 MG-UNT-MCG Chew   cefdinir 300 MG capsule Commonly known as: OMNICEF       TAKE these medications    amLODipine  5 MG tablet Commonly known as: NORVASC  Take 5 mg by mouth daily.   aspirin  EC 81 MG tablet Take 81 mg by mouth daily. Swallow whole.   cephALEXin 500 MG capsule Commonly known as: KEFLEX Take 2 capsules (1,000 mg total) by mouth 3 (three) times daily for 9 days.   Cholecalciferol  50 MCG (2000 UT) Caps Take 1 capsule by mouth daily.   clopidogrel  75 MG tablet Commonly known as: PLAVIX  Take 1 tablet (75 mg total) by mouth daily.   ezetimibe  10 MG tablet Commonly known as: ZETIA  Take 10 mg by mouth at bedtime.   isosorbide  mononitrate 30 MG 24 hr tablet Commonly known as: IMDUR  Take 120 mg by mouth daily.   metoprolol  succinate 100 MG 24 hr tablet Commonly known as: TOPROL -XL Take 1 tablet (100 mg total) by mouth at bedtime. What changed: medication strength   nitroGLYCERIN  0.4 MG SL tablet Commonly known as: NITROSTAT  Place under the tongue.   polyethylene glycol 17 g packet Commonly known as: MIRALAX / GLYCOLAX Take 17 g by mouth daily as needed for mild constipation.   PreserVision AREDS 2 Caps Take 1 capsule by mouth daily.   rosuvastatin  10 MG tablet Commonly known as: CRESTOR  Take 1 tablet (10 mg total) by mouth daily.        Follow-up Information     Warren Cramp, MD Follow up in 5 day(s).   Specialty: Pediatrics Why: hospital follow up Contact information: [address] [place] [address] [phone]         Mady Bruckner, MD Follow up in 2 week(s).   Specialty: Cardiology Contact information: [address] [place] [address] [phone]         Twylla Glendia BROCKS, MD Follow up in 3 week(s).   Specialty: Urology Contact information: [address] [place] [address] [phone]                Discharge Exam: Fredricka Weights   12/17/23 2216  Weight: 59.4 kg   Physical Exam HENT:     Head: Normocephalic.     Mouth/Throat:     Pharynx: No oropharyngeal exudate.   Eyes:     General: Lids are normal.     Conjunctiva/sclera: Conjunctivae normal.    Cardiovascular:     Rate and Rhythm: Normal rate and regular rhythm.     Heart sounds: S1 normal and S2 normal. Murmur heard.     Systolic murmur is present with a grade of 2/6.  Pulmonary:  Breath sounds: No decreased breath sounds, wheezing, rhonchi or rales.  Abdominal:     Palpations: Abdomen is soft.     Tenderness: There is no abdominal tenderness.   Musculoskeletal:     Right lower leg: No swelling.     Left lower leg: No swelling.   Skin:    General: Skin is warm.     Findings: No rash.   Neurological:     Mental Status: She is alert and oriented to person, place, and time.      Condition at discharge: stable  The results of significant diagnostics from this hospitalization (including imaging, microbiology, ancillary and laboratory) are listed below for reference.   Imaging Studies: ECHOCARDIOGRAM COMPLETE Result Date: 12/19/2023    ECHOCARDIOGRAM REPORT   Patient Name:   Alexis Perez Date of Exam: 12/19/2023 Medical Rec #:  [id number]     Height:       60.0 in Accession #:    [id number]    Weight:       131.0 lb Date of Birth:  1924-02-19      BSA:          1.559 m Patient Age:    89 years      BP:           161/77 mmHg Patient Gender: F             HR:           92 bpm. Exam Location:  [facility] Procedure: 2D Echo,  Cardiac Doppler and Color Doppler (Both Spectral and Color            Flow Doppler were utilized during procedure). Indications:     Aortic valve disorder I35.9  History:         Patient has prior history of Echocardiogram examinations, most                  recent 10/04/2021. Risk Factors:Hypertension.  Sonographer:     Christopher Furnace Referring Phys:  [id number] [name] JINNY LUNGER Diagnosing Phys: [name] LUNGER MD IMPRESSIONS  1. Left ventricular ejection fraction, by estimation, is 60 to 65%. The left ventricle has normal function. The left ventricle has no regional wall motion abnormalities. There is moderate left ventricular hypertrophy. Left ventricular diastolic parameters are consistent with Grade I diastolic dysfunction (impaired relaxation).  2. Right ventricular systolic function is normal. The right ventricular size is normal. There is normal pulmonary artery systolic pressure. The estimated right ventricular systolic pressure is 26.9 mmHg.  3. The mitral valve is normal in structure. No evidence of mitral valve regurgitation. No evidence of mitral stenosis. Moderate mitral annular calcification.  4. The aortic valve is calcified. There is moderate calcification of the aortic valve. Aortic valve regurgitation is not visualized. Moderate aortic valve stenosis. Aortic valve area, by VTI measures 1.83 cm. Aortic valve mean gradient measures 15.0 mmHg. Aortic valve Vmax measures 2.49 m/s.  5. The inferior vena cava is normal in size with greater than 50% respiratory variability, suggesting right atrial pressure of 3 mmHg. FINDINGS  Left Ventricle: Left ventricular ejection fraction, by estimation, is 60 to 65%. The left ventricle has normal function. The left ventricle has no regional wall motion abnormalities. Strain was performed and the global longitudinal strain is indeterminate. The left ventricular internal cavity size was normal in size. There is moderate left ventricular hypertrophy. Left ventricular diastolic  parameters are consistent with Grade I diastolic dysfunction (impaired relaxation). Right Ventricle: The right ventricular size  is normal. No increase in right ventricular wall thickness. Right ventricular systolic function is normal. There is normal pulmonary artery systolic pressure. The tricuspid regurgitant velocity is 2.34 m/s, and  with an assumed right atrial pressure of 5 mmHg, the estimated right ventricular systolic pressure is 26.9 mmHg. Left Atrium: Left atrial size was normal in size. Right Atrium: Right atrial size was normal in size. Pericardium: There is no evidence of pericardial effusion. Mitral Valve: The mitral valve is normal in structure. There is mild calcification of the mitral valve leaflet(s). Moderate mitral annular calcification. No evidence of mitral valve regurgitation. No evidence of mitral valve stenosis. MV peak gradient, 12.7 mmHg. The mean mitral valve gradient is 6.0 mmHg. Tricuspid Valve: The tricuspid valve is normal in structure. Tricuspid valve regurgitation is not demonstrated. No evidence of tricuspid stenosis. Aortic Valve: The aortic valve is calcified. There is moderate calcification of the aortic valve. Aortic valve regurgitation is not visualized. Moderate aortic stenosis is present. Aortic valve mean gradient measures 15.0 mmHg. Aortic valve peak gradient  measures 24.9 mmHg. Aortic valve area, by VTI measures 1.83 cm. Pulmonic Valve: The pulmonic valve was normal in structure. Pulmonic valve regurgitation is not visualized. No evidence of pulmonic stenosis. Aorta: The aortic root is normal in size and structure. Venous: The inferior vena cava is normal in size with greater than 50% respiratory variability, suggesting right atrial pressure of 3 mmHg. IAS/Shunts: No atrial level shunt detected by color flow Doppler. Additional Comments: 3D was performed not requiring image post processing on an independent workstation and was indeterminate.  LEFT VENTRICLE PLAX 2D  LVIDd:         3.80 cm   Diastology LVIDs:         2.10 cm   LV e' medial:    3.59 cm/s LV PW:         0.90 cm   LV E/e' medial:  23.6 LV IVS:        1.40 cm   LV e' lateral:   3.59 cm/s LVOT diam:     2.00 cm   LV E/e' lateral: 23.6 LV SV:         76 LV SV Index:   49 LVOT Area:     3.14 cm  RIGHT VENTRICLE RV Basal diam:  3.40 cm RV Mid diam:    3.70 cm RV S prime:     16.30 cm/s TAPSE (M-mode): 1.7 cm LEFT ATRIUM             Index        RIGHT ATRIUM          Index LA diam:        4.30 cm 2.76 cm/m   RA Area:     9.74 cm LA Vol (A2C):   40.5 ml 25.97 ml/m  RA Volume:   16.70 ml 10.71 ml/m LA Vol (A4C):   46.2 ml 29.63 ml/m LA Biplane Vol: 47.2 ml 30.27 ml/m  AORTIC VALVE AV Area (Vmax):    1.74 cm AV Area (Vmean):   1.90 cm AV Area (VTI):     1.83 cm AV Vmax:           249.33 cm/s AV Vmean:          182.333 cm/s AV VTI:            0.415 m AV Peak Grad:      24.9 mmHg AV Mean Grad:      15.0 mmHg LVOT  Vmax:         138.00 cm/s LVOT Vmean:        110.000 cm/s LVOT VTI:          0.242 m LVOT/AV VTI ratio: 0.58  AORTA Ao Root diam: 3.30 cm MITRAL VALVE                TRICUSPID VALVE MV Area (PHT): 4.80 cm     TR Peak grad:   21.9 mmHg MV Area VTI:   2.42 cm     TR Vmax:        234.00 cm/s MV Peak grad:  12.7 mmHg MV Mean grad:  6.0 mmHg     SHUNTS MV Vmax:       1.78 m/s     Systemic VTI:  0.24 m MV Vmean:      110.0 cm/s   Systemic Diam: 2.00 cm MV Decel Time: 158 msec MV E velocity: 84.90 cm/s MV A velocity: 140.00 cm/s MV E/A ratio:  0.61 [name] Lunger MD Electronically signed by [name] Lunger MD Signature Date/Time: 12/19/2023/12:55:26 PM    Final    NM Renal Imaging Flow W/Pharm Result Date: 12/18/2023 CLINICAL DATA:  Right hydronephrosis, urinary tract infection, sepsis EXAM: NUCLEAR MEDICINE RENAL SCAN WITH DIURETIC ADMINISTRATION TECHNIQUE: Radionuclide angiographic and sequential renal images were obtained after intravenous injection of radiopharmaceutical. Imaging was continued during slow  intravenous injection of Lasix  approximately 15 minutes after the start of the examination. RADIOPHARMACEUTICALS:  4.99 mCi Technetium-60m MAG3 IV COMPARISON:  12/17/2023 FINDINGS: Flow: Asymmetric decreased radiotracer uptake within the right kidney relative to the left on flow phase of the exam. Left renogram: Normal left radiographic curve. Right renogram: Blunted peak of the right renal graphic curve, otherwise normal appearance. Differential: Left kidney = 59.8 % Right kidney = 40.2 % T1/2 post Lasix  : Left kidney = 3.8 min Right kidney = 20.7 min IMPRESSION: 1. 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 2. Normal left renal function. Electronically Signed   By: Ozell Daring M.D.   On: 12/18/2023 20:23   CT Angio Chest/Abd/Pel for Dissection W and/or Wo Contrast Result Date: 12/17/2023 EXAM: CTA CHEST, ABDOMEN AND PELVIS WITHOUT AND WITH CONTRAST 12/17/2023 07:45:54 PM TECHNIQUE: CTA of the chest was performed without and with the administration of intravenous contrast. CTA of the abdomen and pelvis was performed without and with the administration of intravenous contrast. Multiplanar reformatted images are provided for review. MIP images are provided for review. Automated exposure control, iterative reconstruction, and/or weight based adjustment of the mA/kV was utilized to reduce the radiation dose to as low as reasonably achievable. COMPARISON: None available. CLINICAL HISTORY: Aortic aneurysm suspected. Pt BIB [facility] from home with reports of syncope, recent dx of UTI yesterday, and hyponatremia. Denies pain. FINDINGS: VASCULATURE: AORTA: No evidence of thoracic aortic aneurysm or dissection. Atherosclerotic calcification of the aortic arch. No evidence of abdominal aortic aneurysm or dissection.  Atherosclerotic calcifications. PULMONARY ARTERIES: Although not tailored for evaluation of the pulmonary arteries, there is no evidence of pulmonary embolism. GREAT VESSELS OF AORTIC ARCH: No acute finding. No dissection. No arterial occlusion or significant stenosis. CELIAC TRUNK: The celiac artery is patent, with atherosclerotic calcification. SUPERIOR MESENTERIC ARTERY: The SMA is patent, with atherosclerotic calcification. INFERIOR MESENTERIC ARTERY: No acute finding. No occlusion or significant stenosis. RENAL ARTERIES:  Bilateral renal arteries are patent, with atherosclerotic calcifications at the origin. ILIAC ARTERIES: Atherosclerotic calcification of the bilateral iliac arteries, although patent. CHEST: MEDIASTINUM: No mediastinal lymphadenopathy. The heart and pericardium demonstrate no acute abnormality. Severe 3-vessel coronary atherosclerosis. Dominant 3.4 cm left thyroid nodule (image 29). Given the patient's age, no follow-up is recommended. LUNGS AND PLEURA: Mild subpleural reticulation/fibrosis in the lungs bilaterally, lower lobe predominant suggesting mild chronic interstitial lung disease or post-infectious inflammatory scarring. Mild interlobular septal thickening in the bilateral upper lobes (for example, image 56) at least raising the possibility of mild interstitial edema. Scattered small bilateral pulmonary nodules measuring up to 4 mm likely benign. Given the patient's age, no dedicated follow-up imaging is suggested. No evidence of pleural effusion or pneumothorax. THORACIC BONES AND SOFT TISSUES: No acute findings. ABDOMEN AND PELVIS: LIVER: Scattered hepatic cysts measuring up to 6.5 cm in segment 4, benign. GALLBLADDER AND BILE DUCTS: Gallbladder is unremarkable. No biliary ductal dilatation. SPLEEN: The spleen is unremarkable. PANCREAS: The pancreas is unremarkable. ADRENAL GLANDS: Bilateral adrenal glands demonstrate no acute abnormality. KIDNEYS, URETERS AND BLADDER: Simple  bilateral renal cysts are present measuring up to 2.7 cm (image 179), benign Bosniak 1. No follow-up is recommended. 2.2 cm hyperdense lesion in the medial right lower kidney (image 199), indeterminate. Given the patient's age, no follow-up is recommended. 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 Very mild soft tissue prominence at the right UVJ is also possible (image 268). Unclear that urologic evaluation is beneficial in this patient. No stones in the kidneys or ureters. No perinephric or periureteral stranding. Urinary bladder is unremarkable. GI AND BOWEL: The appendix is not visualized. There is moderate right colonic stool burden, suggesting mild constipation. Stomach and duodenal sweep demonstrate no acute abnormality. There is no bowel obstruction. No abnormal bowel wall thickening or distension. REPRODUCTIVE: Reproductive organs are unremarkable. PERITONEUM AND RETROPERITONEUM: No ascites or free air. LYMPH NODES: No lymphadenopathy. ABDOMINAL BONES AND SOFT TISSUES: Mild degenerative changes of the lumbar spine. No acute soft tissue abnormality. IMPRESSION: 1. No thoracoabdominal aortic aneurysm or dissection. 2. 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 Urology consultation can be considered but unclear that aggressive evaluation is appropriate. 3. Possible mild interstitial edema, equivocal. No pleural effusion. 4. Additional ancillary findings as above. No follow-up is recommended given the patient's age. Electronically signed by: Pinkie Pebbles MD 12/17/2023 08:56 PM EDT RP Workstation: [id number]   CT Head Wo Contrast Result Date: 12/17/2023 CLINICAL DATA:  Delirium EXAM: CT HEAD WITHOUT CONTRAST TECHNIQUE: Contiguous axial images were obtained from the base of the skull through the  vertex without intravenous contrast. RADIATION DOSE REDUCTION: This exam was performed according to the departmental dose-optimization program which includes automated exposure control, adjustment of the mA and/or kV according to patient size and/or use of iterative reconstruction technique. COMPARISON:  None Available. FINDINGS: Brain: Hypodensities throughout the periventricular white matter and basal ganglia consistent with chronic small vessel ischemic changes. No evidence of acute infarct or hemorrhage. Lateral ventricles and remaining midline structures are unremarkable. No acute extra-axial fluid collections. No mass effect. Vascular: No hyperdense vessel or unexpected calcification. Skull: Normal. Negative for fracture or focal lesion. Sinuses/Orbits: Polypoid mucosal thickening of the maxillary and frontal sinuses. Other: None. IMPRESSION: 1. No acute intracranial process. Electronically Signed   By: Ozell Daring M.D.   On: 12/17/2023 20:02  DG Chest Port 1 View Result Date: 12/17/2023 EXAM: 1 VIEW XRAY OF THE CHEST 12/17/2023 06:58:00 PM COMPARISON: 10/11/2021 CLINICAL HISTORY: Questionable sepsis - evaluate for abnormality. Pt BIB [facility] from home with reports of syncope, recent dx of UTI yesterday, and hyponatremia. Denies pain. Pt received 500cc NacL en route. FINDINGS: LUNGS AND PLEURA: Increased interstitial markings, chronic, without frank interstitial edema. No focal pulmonary opacity. No pleural effusion. No pneumothorax. HEART AND MEDIASTINUM: No acute abnormality of the cardiac and mediastinal silhouettes. Thoracic aortic atherosclerosis. BONES AND SOFT TISSUES: No acute osseous abnormality. IMPRESSION: 1. No acute findings. Electronically signed by: Pinkie Pebbles MD 12/17/2023 07:08 PM EDT RP Workstation: [id number]    Microbiology: Results for orders placed or performed during the hospital encounter of 12/17/23  Blood Culture (routine x 2)     Status: Abnormal   Collection Time:  12/17/23  6:13 PM   Specimen: BLOOD  Result Value Ref Range Status   Specimen Description   Final    BLOOD RIGHT ANTECUBITAL Performed at [facility], [address] [place], [place] [address]    Special Requests   Final    BOTTLES DRAWN AEROBIC AND ANAEROBIC Blood Culture results may not be optimal due to an inadequate volume of blood received in culture bottles Performed at [facility], [address] [place], [place] [address]    Culture  Setup Time   Final    GRAM NEGATIVE RODS AEROBIC BOTTLE ONLY CRITICAL RESULT CALLED TO, READ BACK BY AND VERIFIED WITH: ALEX CHAPPEL 12/18/23 0745 MW GRAM STAIN REVIEWED-AGREE WITH RESULT DRT    Culture (A)  Final    ESCHERICHIA COLI SUSCEPTIBILITIES PERFORMED ON PREVIOUS CULTURE WITHIN THE LAST 5 DAYS. Performed at [facility], [address], [place], [place] [address]    Report Status 12/20/2023 FINAL  Final  Blood Culture (routine x 2)     Status: Abnormal   Collection Time: 12/17/23  6:13 PM   Specimen: BLOOD  Result Value Ref Range Status   Specimen Description   Final    BLOOD LEFT ANTECUBITAL Performed at [facility], [address] [place], [place] [address]    Special Requests   Final    BOTTLES DRAWN AEROBIC AND ANAEROBIC Blood Culture results may not be optimal due to an inadequate volume of blood received in culture bottles Performed at [facility], [address] [place], [place] [address]    Culture  Setup Time   Final    GRAM NEGATIVE RODS ANAEROBIC BOTTLE ONLY Organism ID to follow CRITICAL RESULT CALLED TO, READ BACK BY AND VERIFIED WITH: ALEX CHAPPEL 12/18/23 GRAM STAIN REVIEWED-AGREE WITH RESULT DRT Performed at [facility], [address] [place], [place] [address]    Culture ESCHERICHIA COLI (A)  Final   Report Status 12/20/2023 FINAL  Final   Organism ID, Bacteria ESCHERICHIA COLI  Final   Organism ID, Bacteria ESCHERICHIA COLI  Final       Susceptibility   Escherichia coli - KIRBY BAUER*    CEFAZOLIN  SENSITIVE Sensitive    Escherichia coli - MIC*    AMPICILLIN 8 SENSITIVE Sensitive     CEFEPIME <=0.12 SENSITIVE Sensitive     CEFTAZIDIME <=1 SENSITIVE Sensitive     CEFTRIAXONE  <=0.25 SENSITIVE Sensitive     CIPROFLOXACIN  <=0.25 SENSITIVE Sensitive     GENTAMICIN <=1 SENSITIVE Sensitive     IMIPENEM <=0.25 SENSITIVE Sensitive     TRIMETH/SULFA <=20 SENSITIVE Sensitive  AMPICILLIN/SULBACTAM <=2 SENSITIVE Sensitive     PIP/TAZO <=4 SENSITIVE Sensitive ug/mL    * ESCHERICHIA COLI    ESCHERICHIA COLI  Blood Culture ID Panel (Reflexed)     Status: Abnormal   Collection Time: 12/17/23  6:13 PM  Result Value Ref Range Status   Enterococcus faecalis NOT DETECTED NOT DETECTED Final   Enterococcus Faecium NOT DETECTED NOT DETECTED Final   Listeria monocytogenes NOT DETECTED NOT DETECTED Final   Staphylococcus species NOT DETECTED NOT DETECTED Final   Staphylococcus aureus (BCID) NOT DETECTED NOT DETECTED Final   Staphylococcus epidermidis NOT DETECTED NOT DETECTED Final   Staphylococcus lugdunensis NOT DETECTED NOT DETECTED Final   Streptococcus species NOT DETECTED NOT DETECTED Final   Streptococcus agalactiae NOT DETECTED NOT DETECTED Final   Streptococcus pneumoniae NOT DETECTED NOT DETECTED Final   Streptococcus pyogenes NOT DETECTED NOT DETECTED Final   A.calcoaceticus-baumannii NOT DETECTED NOT DETECTED Final   Bacteroides fragilis NOT DETECTED NOT DETECTED Final   Enterobacterales DETECTED (A) NOT DETECTED Final    Comment: Enterobacterales represent a large order of gram negative bacteria, not a single organism. CRITICAL RESULT CALLED TO, READ BACK BY AND VERIFIED WITH: ALEX CHAPEL 12/18/23 0745 MW    Enterobacter cloacae complex NOT DETECTED NOT DETECTED Final   Escherichia coli DETECTED (A) NOT DETECTED Final    Comment: CRITICAL RESULT CALLED TO, READ BACK BY AND VERIFIED WITH: ALEX CHAPEL 12/18/23 0745 MW     Klebsiella aerogenes NOT DETECTED NOT DETECTED Final   Klebsiella oxytoca NOT DETECTED NOT DETECTED Final   Klebsiella pneumoniae NOT DETECTED NOT DETECTED Final   Proteus species NOT DETECTED NOT DETECTED Final   Salmonella species NOT DETECTED NOT DETECTED Final   Serratia marcescens NOT DETECTED NOT DETECTED Final   Haemophilus influenzae NOT DETECTED NOT DETECTED Final   Neisseria meningitidis NOT DETECTED NOT DETECTED Final   Pseudomonas aeruginosa NOT DETECTED NOT DETECTED Final   Stenotrophomonas maltophilia NOT DETECTED NOT DETECTED Final   Candida albicans NOT DETECTED NOT DETECTED Final   Candida auris NOT DETECTED NOT DETECTED Final   Candida glabrata NOT DETECTED NOT DETECTED Final   Candida krusei NOT DETECTED NOT DETECTED Final   Candida parapsilosis NOT DETECTED NOT DETECTED Final   Candida tropicalis NOT DETECTED NOT DETECTED Final   Cryptococcus neoformans/gattii NOT DETECTED NOT DETECTED Final   CTX-M ESBL NOT DETECTED NOT DETECTED Final   Carbapenem resistance IMP NOT DETECTED NOT DETECTED Final   Carbapenem resistance KPC NOT DETECTED NOT DETECTED Final   Carbapenem resistance NDM NOT DETECTED NOT DETECTED Final   Carbapenem resist OXA 48 LIKE NOT DETECTED NOT DETECTED Final   Carbapenem resistance VIM NOT DETECTED NOT DETECTED Final    Comment: Performed at [facility], [address], [place], [place] [address]  Urine Culture     Status: None   Collection Time: 12/17/23  8:09 PM   Specimen: Urine, Random  Result Value Ref Range Status   Specimen Description   Final    URINE, RANDOM Performed at [facility], [address] [place], [place] [address]    Special Requests   Final    NONE Reflexed from [id number] Performed at [facility], [address] [place], [place] [address]    Culture   Final    NO GROWTH Performed at [facility], [address] [place], [place] [address]    Report Status 12/18/2023 FINAL  Final     Labs: CBC: Recent Labs  Lab 12/17/23 1813 12/18/23 0706 12/19/23 0206  WBC 10.2 7.7 7.0  NEUTROABS 7.8*  --   --   HGB 11.7* 12.2 12.3  HCT 35.0* 36.3 36.7  MCV 92.1 91.4 92.0  PLT 187 168 169   Basic Metabolic Panel: Recent Labs  Lab 12/17/23 1813 12/18/23 0706 12/19/23 0942 12/21/23 0558  NA 128* 132* 131* 133*  K 3.6 3.0* 3.0* 4.5  CL 92* 94* 94* 100  CO2 25 28 26 24   GLUCOSE 123* 123* 117* 133*  BUN 22 14 15 19   CREATININE 0.56 0.50 0.56 0.64  CALCIUM  8.9 8.9 8.5* 8.9  MG  --   --  1.8  --    Liver Function Tests: Recent Labs  Lab 12/17/23 1813 12/18/23 0706  AST 22 46*  ALT 13 16  ALKPHOS 51 48  BILITOT 1.1 0.8  PROT 6.7 6.3*  ALBUMIN 3.4* 3.2*   CBG: No results for input(s): GLUCAP in the last 168 hours.  Discharge time spent: greater than 30 minutes.  Signed: Charlie Patterson, MD [facility] 12/21/2023

## 2023-12-21 NOTE — Plan of Care (Signed)
  Problem: Health Behavior/Discharge Planning: Goal: Ability to manage health-related needs will improve Outcome: Progressing   Problem: Clinical Measurements: Goal: Cardiovascular complication will be avoided Outcome: Progressing   Problem: Elimination: Goal: Will not experience complications related to urinary retention Outcome: Progressing   Problem: Skin Integrity: Goal: Risk for impaired skin integrity will decrease Outcome: Progressing

## 2023-12-30 NOTE — Progress Notes (Unsigned)
 Cardiology Clinic Note   Date: 01/01/2024 ID: Alexis Perez, DOB 1924/04/23, MRN [id number]  Primary Cardiologist:  [name] Lunger, MD  Chief Complaint   Alexis Perez is a 89 y.o. female who presents to the clinic today for hospital follow up.   Patient Profile   Alexis Perez is followed by Dr. Gollan for the history outlined below.      Past medical history significant for: CAD.  LHC 10/19/2021 performed at [facility]: Proximal LCx 95%.  Proximal to mid LAD 80%.  Occluded RCA with right to left collaterals.  PCI with rotational atherectomy and DES 2.5 x 28 mm to proximal LCx. Aortic valve stenosis. Echo 12/19/2023: EF 60 to 65%.  No RWMA.  Moderate LVH.  Grade I DD.  Normal RV size/function.  Normal PA pressure, RVSP 26.9 mmHg.  Moderate MAC.  Moderate calcification of aortic valve.  Moderate aortic valve stenosis, mean gradient 15 mmHg. Hypertension.  Syncope. Dementia.  In summary, patient with history of CAD s/p PCI with DES to proximal LCx April 2023 performed at [facility].  Echo in April 2023 showed EF 60 to 65%, no RWMA, moderate LVH, grade 2 DD, normal RV size/function, mild to moderate MR, mild aortic stenosis.  Patient presented to [facility] ED on 12/17/2023 for syncope.  She had been diagnosed with UTI the day prior.  Patient's daughter reported several brief episodes of passing out x 3 days.  Patient complained of feeling as though her bra was too tight alleviated by removing her bra.  Initial labs: WBC 10.2, hemoglobin 11.7, hematocrit 35, sodium 128, potassium 3.6, BUN 22, creatinine 0.56.  Troponin 174>> 571>> 2751>> 4599.  EKG with ST depression in anterolateral leads.  Chest x-ray without acute findings.  CTA chest abdomen pelvis demonstrated thoracoabdominal aortic aneurysm or dissection, moderate right hydronephrosis with urothelial thickening possibly reflecting UPJ stenosis, possible mild interstitial edema.  CT head without acute intracranial  process.  Echo demonstrated normal LV/RV function, moderate aortic valve stenosis as detailed above.  Given lack of symptoms and age in the setting of bacteremia/sepsis normal LV function will continue medical management with no plan for ischemic workup.  Patient was discharged on 12/21/2023.     History of Present Illness    Today, patient is accompanied by her daughter. She reports finally feeling well now that she completed her antibiotic course. While on the antibiotic she had diarrhea and fatigue. She normally walks 1-2 times a day with her daughter and was unable to complete her walks. She is back to tolerating walks without difficulty. Patient denies shortness of breath, dyspnea on exertion, orthopnea or PND. She has chronic, stable lower extremity edema. Left leg is always large than right secondary to past injuries and presence of orthopedic plates. No chest pain, pressure, or tightness. No palpitations. Discussed results of echo in detail. All questions answered.      ROS: All other systems reviewed and are otherwise negative except as noted in History of Present Illness.  EKGs/Labs Reviewed        12/18/2023: ALT 16; AST 46 12/21/2023: BUN 19; Creatinine, Ser 0.64; Potassium 4.5; Sodium 133   12/19/2023: Hemoglobin 12.3; WBC 7.0    Physical Exam    VS:  BP 138/68 (BP Location: Left Arm, Patient Position: Sitting)   Pulse 100   Ht 5' (1.524 m)   Wt 134 lb 6.4 oz (61 kg)   SpO2 100%   BMI 26.25 kg/m  , BMI Body mass index is  26.25 kg/m.  GEN: Well nourished, well developed, in no acute distress. Neck: No JVD or carotid bruits. Cardiac:  RRR. 2/6 systolic murmur. No rubs or gallops.   Respiratory:  Respirations regular and unlabored. Clear to auscultation without rales, wheezing or rhonchi. GI: Soft, nontender, nondistended. Extremities: Radials/DP/PT 2+ and equal bilaterally. No clubbing or cyanosis. Mild to moderate nonpitting L>R lower extremity edema.    Skin: Warm and  dry, no rash. Neuro: Strength intact.  Assessment & Plan   CAD LHC April 2023 demonstrated proximal mid LAD 80%, occluded RCA with right to left collaterals, proximal LCx 95% s/p PCI with DES to proximal LCx performed at [facility].  Patient denies chest pain, pressure or tightness. She is active walking 1-2 times a day with her daughter.  - Continue aspirin , Plavix , amlodipine , Toprol , rosuvastatin , Zetia , isosorbide , as needed SL NTG.  Aortic valve stenosis Echo June 2025 showed EF 60 to 65%, moderate LVH, Grade I DD, normal RV size/function, normal PA pressure, moderate MAC, moderate aortic valve stenosis mean gradient 15 mmHg.  Patient denies lightheadedness or dizziness. No syncope since hospital discharge. She feels syncopal episode was related to UTI. 2/6 systolic murmur.  - Repeat echo as clinically indicated.  Hypertension BP today 138/68. No headaches or dizziness reported.  - Continue amlodipine  and Toprol .  Disposition: Return in 6 months or sooner as needed.          Signed, Barnie HERO. Gussie Murton, DNP, NP-C

## 2024-01-01 ENCOUNTER — Ambulatory Visit: Attending: Student | Admitting: Student

## 2024-01-01 ENCOUNTER — Encounter: Payer: Self-pay | Admitting: Student

## 2024-01-01 VITALS — BP 138/68 | HR 100 | Ht 60.0 in | Wt 134.4 lb

## 2024-01-01 DIAGNOSIS — I2510 Atherosclerotic heart disease of native coronary artery without angina pectoris: Secondary | ICD-10-CM | POA: Diagnosis not present

## 2024-01-01 DIAGNOSIS — I350 Nonrheumatic aortic (valve) stenosis: Secondary | ICD-10-CM

## 2024-01-01 DIAGNOSIS — I10 Essential (primary) hypertension: Secondary | ICD-10-CM

## 2024-01-01 NOTE — Patient Instructions (Signed)
 Medication Instructions:  Your physician recommends that you continue on your current medications as directed. Please refer to the Current Medication list given to you today.   *If you need a refill on your cardiac medications before your next appointment, please call your pharmacy*  Lab Work: None ordered at this time  If you have labs (blood work) drawn today and your tests are completely normal, you will receive your results only by: MyChart Message (if you have MyChart) OR A paper copy in the mail If you have any lab test that is abnormal or we need to change your treatment, we will call you to review the results.  Testing/Procedures: None ordered at this time   Follow-Up: At [facility], you and your health needs are our priority.  As part of our continuing mission to provide you with exceptional heart care, our providers are all part of one team.  This team includes your primary Cardiologist (physician) and Advanced Practice Providers or APPs (Physician Assistants and Nurse Practitioners) who all work together to provide you with the care you need, when you need it.  Your next appointment:   6 month(s)  Provider:   Timothy Gollan, MD or Barnie Hila, NP    We recommend signing up for the patient portal called MyChart.  Sign up information is provided on this After Visit Summary.  MyChart is used to connect with patients for Virtual Visits (Telemedicine).  Patients are able to view lab/test results, encounter notes, upcoming appointments, etc.  Non-urgent messages can be sent to your provider as well.   To learn more about what you can do with MyChart, go to [email or web address].

## 2024-01-08 ENCOUNTER — Telehealth: Payer: Self-pay | Admitting: Student

## 2024-01-08 ENCOUNTER — Other Ambulatory Visit: Payer: Self-pay

## 2024-01-08 MED ORDER — CLOPIDOGREL BISULFATE 75 MG PO TABS: 75.0000 mg | ORAL_TABLET | Freq: Every day | ORAL | 1 refills | Status: DC

## 2024-01-08 MED ORDER — AMLODIPINE BESYLATE 5 MG PO TABS: 5.0000 mg | ORAL_TABLET | Freq: Every day | ORAL | 1 refills | Status: DC

## 2024-01-08 MED ORDER — METOPROLOL SUCCINATE ER 100 MG PO TB24: 100.0000 mg | ORAL_TABLET | Freq: Every day | ORAL | 1 refills | Status: DC

## 2024-01-08 NOTE — Telephone Encounter (Signed)
 Called patient, made aware per last OV- to continue medications below.  Sent over refills for 6 months.

## 2024-01-08 NOTE — Telephone Encounter (Signed)
 Pt c/o medication issue:  1. Name of Medication:   clopidogrel  (PLAVIX ) 75 MG tablet    metoprolol  succinate (TOPROL -XL) 100 MG 24 hr tablet   2. How are you currently taking this medication (dosage and times per day)?  Take 1 tablet (75 mg total) by mouth daily.     Take 1 tablet (100 mg total) by mouth at bedtime.      3. Are you having a reaction (difficulty breathing--STAT)? No  4. What is your medication issue? Pt stated she was in the hospital last month and would like to know if she should continue to take these medications or not and if so she'll need a refill but she'd like a callback with confirmation of what to do. Please advise

## 2024-01-20 NOTE — Progress Notes (Addendum)
 01/21/2024 3:36 PM   [name] 1924-03-13 [id number]  Referring provider: Warren Cramp, MD No address on file  Urological history: 1. Right UPJ obstruction  Chief Complaint  Patient presents with   Hospitalization Follow-up   HPI: Alexis Perez is a 89 y.o. woman who presents today for hospital follow-up with her daughter, Alexis Perez.    Previous records reviewed.   She was admitted in June for multiple syncopal episodes at home.  She was found to have an NSTEMI and sepsis secondary to UTI.  Admitting CT noted moderate right hydronephrosis with some parenchymal atrophy and a probable UPJ obstruction.   A renal Lasix  scan was found to have ~60L/40R with markedly dilated renal pelvis and based on renal function unlikely to have significant obstruction.    She feels well today.  She states she did put her old pessary area and this morning as she cannot void on command without it in place.  Patient denies any modifying or aggravating factors.  Patient denies any recent UTI's, gross hematuria, dysuria or suprapubic/flank pain.  Patient denies any fevers, chills, nausea or vomiting.   She did have some bleeding, but she feels it is from placement of the pessary.  She states she does bleed when she places her pessary.    UA nitrate positive, greater than 30 WBCs, 11-30 RBCs and many bacteria.  Lab Results  Component Value Date   CREATININE 0.64 12/21/2023    PMH: Past Medical History:  Diagnosis Date   Hypertension     Surgical History: Past Surgical History:  Procedure Laterality Date   ANKLE FRACTURE SURGERY Left 1999    Home Medications:  Allergies as of 01/21/2024       Reactions   Fentanyl Nausea And Vomiting   Sodium Pentobarbital [pentobarbital] Anaphylaxis   When pt was sedated for surgery   Hydrochlorothiazide Other (See Comments)   hyponatremia   Ciprofloxacin     Blurry vision, sleepy and make pt sick   Codeine Other (See Comments)   Other reaction(s):  UNKNOWN   Penicillin G Rash   Sulfa Antibiotics Rash        Medication List        Accurate as of January 21, 2024  3:36 PM. If you have any questions, ask your nurse or doctor.          amLODipine  5 MG tablet Commonly known as: NORVASC  Take 1 tablet (5 mg total) by mouth daily.   aspirin  EC 81 MG tablet Take 81 mg by mouth daily. Swallow whole.   Cholecalciferol  50 MCG (2000 UT) Caps Take 1 capsule by mouth daily.   clopidogrel  75 MG tablet Commonly known as: PLAVIX  Take 1 tablet (75 mg total) by mouth daily.   estradiol  0.1 MG/GM vaginal cream Commonly known as: ESTRACE  VAGINAL Apply 0.5mg  (pea-sized amount)  just inside the vaginal introitus with a finger-tip on Monday, Wednesday and Friday nights.   ezetimibe  10 MG tablet Commonly known as: ZETIA  Take 10 mg by mouth at bedtime.   isosorbide  mononitrate 30 MG 24 hr tablet Commonly known as: IMDUR  Take 120 mg by mouth daily.   metoprolol  succinate 100 MG 24 hr tablet Commonly known as: TOPROL -XL Take 1 tablet (100 mg total) by mouth at bedtime.   nitroGLYCERIN  0.4 MG SL tablet Commonly known as: NITROSTAT  Place under the tongue.   PreserVision AREDS 2 Caps Take 1 capsule by mouth daily.   rosuvastatin  10 MG tablet Commonly known as: CRESTOR  Take 1 tablet (  10 mg total) by mouth daily. What changed:  when to take this reasons to take this        Allergies:  Allergies  Allergen Reactions   Fentanyl Nausea And Vomiting   Sodium Pentobarbital [Pentobarbital] Anaphylaxis    When pt was sedated for surgery   Hydrochlorothiazide Other (See Comments)    hyponatremia   Ciprofloxacin      Blurry vision, sleepy and make pt sick   Codeine Other (See Comments)    Other reaction(s): UNKNOWN   Penicillin G Rash   Sulfa Antibiotics Rash    Family History: No family history on file.  Social History: See HPI for pertinent social history  ROS: Pertinent ROS in HPI  Physical Exam: BP (!) 147/73    Pulse (!) 101   Ht 5' (1.524 m)   Wt 134 lb (60.8 kg)   BMI 26.17 kg/m   Constitutional:  Well nourished. Alert and oriented, No acute distress. HEENT: [place] AT, moist mucus membranes.  Trachea midline Cardiovascular: No clubbing, cyanosis, or edema. Respiratory: Normal respiratory effort, no increased work of breathing. Neurologic: Grossly intact, no focal deficits, moving all 4 extremities. Psychiatric: Normal mood and affect.    Laboratory Data: See EPIC and HPI  I have reviewed the labs.   Pertinent Imaging: N/A  Assessment & Plan:    1. Right UPJ obstruction  - Discussed that the renal function has been stable for the last 2 years and that imaging the obstruction looks to be longstanding -Discussed the risks of having a UPJ obstruction such as gradual loss of kidney function, infection related complications, stone formation and flank pain - Discussed with patient and daughter, we could continue with conservative management with periodic renal function assessment and prompt evaluation if any new symptoms occur and urine culture sent if infection is suspected, have a ureteral stent placed that comes with the risk of anesthesia and stent discomfort and the need for periodic changes or placement of nephrostomy tubes with internalization at a later date, but that would require her coming off her anticoagulants and care for risk for another MI and/or stroke - Her and her daughter would like to continue close monitoring and not pursue stent placement or nephrostomy tube at this time - I did explain that the UA when it results will appear to be infected, I will go ahead and send the urine for culture in case she does develop symptoms of urinary tract infection to help us  guide antibiotic therapy - Advised her to reach out if she should develop gross hematuria, flank pain or symptoms of a UTI - We will see her in 3 months for symptom recheck, repeat BMP and UA  2. Vaginal atrophy - Will go  ahead and start vaginal estrogen cream 3 nights weekly - Explained this may help with the pessary irritation and also reduce her risk for UTIs  3. Pelvic prolapse - She has been using a pessary for a long time, but her current one she feels is too big - Refer to gynecology for pessary refitting  4.  History of sepsis secondary to UTI - Encouraged vaginal estrogen cream 3 nights weekly and d-mannose and staying hydrated   Return in about 3 months (around 04/22/2024) for BMP, symptom recheck .  These notes generated with voice recognition software. I apologize for typographical errors.  Alexis Perez  [facility] [address]  [address] [place], [place] [address] [phone]

## 2024-01-21 ENCOUNTER — Ambulatory Visit: Admitting: Urology

## 2024-01-21 ENCOUNTER — Encounter: Payer: Self-pay | Admitting: Urology

## 2024-01-21 VITALS — BP 147/73 | HR 101 | Ht 60.0 in | Wt 134.0 lb

## 2024-01-21 DIAGNOSIS — N135 Crossing vessel and stricture of ureter without hydronephrosis: Secondary | ICD-10-CM | POA: Diagnosis not present

## 2024-01-21 DIAGNOSIS — Z8619 Personal history of other infectious and parasitic diseases: Secondary | ICD-10-CM

## 2024-01-21 DIAGNOSIS — N8111 Cystocele, midline: Secondary | ICD-10-CM

## 2024-01-21 DIAGNOSIS — N952 Postmenopausal atrophic vaginitis: Secondary | ICD-10-CM | POA: Diagnosis not present

## 2024-01-21 LAB — URINALYSIS, COMPLETE
Bilirubin, UA: NEGATIVE
Glucose, UA: NEGATIVE
Ketones, UA: NEGATIVE
Nitrite, UA: POSITIVE — AB
Specific Gravity, UA: 1.025 (ref 1.005–1.030)
Urobilinogen, Ur: 0.2 mg/dL (ref 0.2–1.0)
pH, UA: 6 (ref 5.0–7.5)

## 2024-01-21 LAB — MICROSCOPIC EXAMINATION: WBC, UA: >30 /HPF — AB (ref 0–5)

## 2024-01-21 MED ORDER — ESTRADIOL 0.1 MG/GM VA CREA: TOPICAL_CREAM | VAGINAL | 12 refills | Status: AC

## 2024-01-21 NOTE — Addendum Note (Signed)
 Addended by: HELON KIRSCH A on: 01/21/2024 03:39 PM   Modules accepted: Orders

## 2024-01-25 LAB — CULTURE, URINE COMPREHENSIVE

## 2024-01-28 ENCOUNTER — Ambulatory Visit: Payer: Self-pay | Admitting: Physician Assistant

## 2024-01-28 ENCOUNTER — Encounter: Payer: Self-pay | Admitting: Obstetrics & Gynecology

## 2024-01-28 DIAGNOSIS — N952 Postmenopausal atrophic vaginitis: Secondary | ICD-10-CM

## 2024-01-29 NOTE — Telephone Encounter (Signed)
 1st attempt to reach pt via phone call,unable to LVM.

## 2024-01-30 MED ORDER — NITROFURANTOIN MONOHYD MACRO 100 MG PO CAPS: 100.0000 mg | ORAL_CAPSULE | Freq: Two times a day (BID) | ORAL | 0 refills | Status: DC

## 2024-01-30 NOTE — Telephone Encounter (Signed)
 Pt informed. Meds sent, pt voiced understanding.

## 2024-01-31 ENCOUNTER — Observation Stay
Admission: EM | Admit: 2024-01-31 | Discharge: 2024-02-02 | Disposition: A | Discharge status: Home / Self Care | Attending: Internal Medicine | Admitting: Internal Medicine

## 2024-01-31 ENCOUNTER — Emergency Department

## 2024-01-31 ENCOUNTER — Other Ambulatory Visit: Payer: Self-pay

## 2024-01-31 ENCOUNTER — Observation Stay

## 2024-01-31 DIAGNOSIS — I10 Essential (primary) hypertension: Secondary | ICD-10-CM | POA: Diagnosis not present

## 2024-01-31 DIAGNOSIS — N132 Hydronephrosis with renal and ureteral calculous obstruction: Secondary | ICD-10-CM | POA: Diagnosis not present

## 2024-01-31 DIAGNOSIS — G9341 Metabolic encephalopathy: Secondary | ICD-10-CM | POA: Diagnosis not present

## 2024-01-31 DIAGNOSIS — R55 Syncope and collapse: Secondary | ICD-10-CM | POA: Insufficient documentation

## 2024-01-31 DIAGNOSIS — Z79899 Other long term (current) drug therapy: Secondary | ICD-10-CM | POA: Diagnosis not present

## 2024-01-31 DIAGNOSIS — N1330 Unspecified hydronephrosis: Secondary | ICD-10-CM | POA: Diagnosis present

## 2024-01-31 DIAGNOSIS — Z7982 Long term (current) use of aspirin: Secondary | ICD-10-CM | POA: Insufficient documentation

## 2024-01-31 DIAGNOSIS — R531 Weakness: Secondary | ICD-10-CM | POA: Diagnosis present

## 2024-01-31 DIAGNOSIS — R54 Age-related physical debility: Secondary | ICD-10-CM | POA: Insufficient documentation

## 2024-01-31 DIAGNOSIS — I2510 Atherosclerotic heart disease of native coronary artery without angina pectoris: Secondary | ICD-10-CM | POA: Diagnosis present

## 2024-01-31 DIAGNOSIS — N3000 Acute cystitis without hematuria: Secondary | ICD-10-CM

## 2024-01-31 DIAGNOSIS — Z955 Presence of coronary angioplasty implant and graft: Secondary | ICD-10-CM | POA: Diagnosis not present

## 2024-01-31 DIAGNOSIS — E876 Hypokalemia: Secondary | ICD-10-CM | POA: Insufficient documentation

## 2024-01-31 DIAGNOSIS — N390 Urinary tract infection, site not specified: Secondary | ICD-10-CM | POA: Diagnosis not present

## 2024-01-31 DIAGNOSIS — R42 Dizziness and giddiness: Secondary | ICD-10-CM

## 2024-01-31 HISTORY — DX: Urinary tract infection, site not specified: N39.0

## 2024-01-31 LAB — CBC WITH DIFFERENTIAL/PLATELET
Abs Immature Granulocytes: 0.05 K/uL (ref 0.00–0.07)
Basophils Absolute: 0.1 K/uL (ref 0.0–0.1)
Basophils Relative: 1 %
Eosinophils Absolute: 0.1 K/uL (ref 0.0–0.5)
Eosinophils Relative: 1 %
HCT: 40.6 % (ref 36.0–46.0)
Hemoglobin: 12.8 g/dL (ref 12.0–15.0)
Immature Granulocytes: 1 %
Lymphocytes Relative: 5 %
Lymphs Abs: 0.5 K/uL — ABNORMAL LOW (ref 0.7–4.0)
MCH: 30.3 pg (ref 26.0–34.0)
MCHC: 31.5 g/dL (ref 30.0–36.0)
MCV: 96 fL (ref 80.0–100.0)
Monocytes Absolute: 0.4 K/uL (ref 0.1–1.0)
Monocytes Relative: 4 %
Neutro Abs: 9.6 K/uL — ABNORMAL HIGH (ref 1.7–7.7)
Neutrophils Relative %: 88 %
Platelets: 247 K/uL (ref 150–400)
RBC: 4.23 MIL/uL (ref 3.87–5.11)
RDW: 14.3 % (ref 11.5–15.5)
WBC: 10.8 K/uL — ABNORMAL HIGH (ref 4.0–10.5)
nRBC: 0 % (ref 0.0–0.2)

## 2024-01-31 LAB — COMPREHENSIVE METABOLIC PANEL WITH GFR
ALT: 16 U/L (ref 0–44)
AST: 23 U/L (ref 15–41)
Albumin: 3.9 g/dL (ref 3.5–5.0)
Alkaline Phosphatase: 69 U/L (ref 38–126)
Anion gap: 10 (ref 5–15)
BUN: 29 mg/dL — ABNORMAL HIGH (ref 8–23)
CO2: 27 mmol/L (ref 22–32)
Calcium: 9.5 mg/dL (ref 8.9–10.3)
Chloride: 99 mmol/L (ref 98–111)
Creatinine, Ser: 0.58 mg/dL (ref 0.44–1.00)
GFR, Estimated: >60 mL/min (ref >60)
Glucose, Bld: 155 mg/dL — ABNORMAL HIGH (ref 70–99)
Potassium: 3.9 mmol/L (ref 3.5–5.1)
Sodium: 136 mmol/L (ref 135–145)
Total Bilirubin: 0.2 mg/dL (ref 0.0–1.2)
Total Protein: 7.3 g/dL (ref 6.5–8.1)

## 2024-01-31 LAB — URINALYSIS, W/ REFLEX TO CULTURE (INFECTION SUSPECTED)
Bilirubin Urine: NEGATIVE
Glucose, UA: 50 mg/dL — AB
Hgb urine dipstick: NEGATIVE
Ketones, ur: NEGATIVE mg/dL
Nitrite: NEGATIVE
Protein, ur: NEGATIVE mg/dL
Specific Gravity, Urine: 1.012 (ref 1.005–1.030)
pH: 6 (ref 5.0–8.0)

## 2024-01-31 LAB — TROPONIN I (HIGH SENSITIVITY)
Troponin I (High Sensitivity): 8 ng/L (ref <18)
Troponin I (High Sensitivity): 8 ng/L (ref <18)
Troponin I (High Sensitivity): 10 ng/L (ref <18)
Troponin I (High Sensitivity): 27 ng/L — ABNORMAL HIGH (ref <18)

## 2024-01-31 LAB — GLUCOSE, CAPILLARY: Glucose-Capillary: 135 mg/dL — ABNORMAL HIGH (ref 70–99)

## 2024-01-31 LAB — LACTIC ACID, PLASMA: Lactic Acid, Venous: 1.4 mmol/L (ref 0.5–1.9)

## 2024-01-31 LAB — D-DIMER, QUANTITATIVE: D-Dimer, Quant: 0.53 ug{FEU}/mL — ABNORMAL HIGH (ref 0.00–0.50)

## 2024-01-31 LAB — PROTIME-INR
INR: 1 (ref 0.8–1.2)
Prothrombin Time: 13.4 s (ref 11.4–15.2)

## 2024-01-31 MED ORDER — ONDANSETRON HCL 4 MG/2ML IJ SOLN
4.0000 mg | Freq: Once | INTRAMUSCULAR | Status: AC
  Administered 2024-01-31: 4 mg via INTRAVENOUS
  Filled 2024-01-31: qty 2

## 2024-01-31 MED ORDER — ASPIRIN 81 MG PO TBEC
81.0000 mg | DELAYED_RELEASE_TABLET | Freq: Every day | ORAL | Status: DC
Start: 2024-02-01 — End: 2024-02-02
  Administered 2024-02-01 – 2024-02-02 (×2): 81 mg via ORAL
  Filled 2024-01-31 (×2): qty 1

## 2024-01-31 MED ORDER — METOPROLOL SUCCINATE ER 100 MG PO TB24
100.0000 mg | ORAL_TABLET | Freq: Every day | ORAL | Status: DC
  Administered 2024-01-31 – 2024-02-01 (×2): 100 mg via ORAL
  Filled 2024-01-31: qty 1
  Filled 2024-01-31: qty 2

## 2024-01-31 MED ORDER — SODIUM CHLORIDE 0.9 % IV BOLUS
500.0000 mL | Freq: Once | INTRAVENOUS | Status: AC
  Administered 2024-02-01: 500 mL via INTRAVENOUS

## 2024-01-31 MED ORDER — NITROGLYCERIN 0.4 MG SL SUBL: 0.4000 mg | SUBLINGUAL_TABLET | SUBLINGUAL | Status: DC | PRN

## 2024-01-31 MED ORDER — ENOXAPARIN SODIUM 40 MG/0.4ML IJ SOSY
40.0000 mg | PREFILLED_SYRINGE | INTRAMUSCULAR | Status: DC
  Administered 2024-01-31 – 2024-02-01 (×2): 40 mg via SUBCUTANEOUS
  Filled 2024-01-31 (×2): qty 0.4

## 2024-01-31 MED ORDER — CLOPIDOGREL BISULFATE 75 MG PO TABS
75.0000 mg | ORAL_TABLET | Freq: Every day | ORAL | Status: DC
  Administered 2024-02-01 – 2024-02-02 (×2): 75 mg via ORAL
  Filled 2024-01-31 (×2): qty 1

## 2024-01-31 MED ORDER — AMLODIPINE BESYLATE 5 MG PO TABS
5.0000 mg | ORAL_TABLET | Freq: Every day | ORAL | Status: DC
Start: 2024-02-01 — End: 2024-02-02
  Administered 2024-02-01 – 2024-02-02 (×2): 5 mg via ORAL
  Filled 2024-01-31 (×2): qty 1

## 2024-01-31 MED ORDER — ACETAMINOPHEN 325 MG PO TABS: 650.0000 mg | ORAL_TABLET | Freq: Four times a day (QID) | ORAL | Status: DC | PRN

## 2024-01-31 MED ORDER — ONDANSETRON HCL 4 MG PO TABS: 4.0000 mg | ORAL_TABLET | Freq: Four times a day (QID) | ORAL | Status: DC | PRN

## 2024-01-31 MED ORDER — SODIUM CHLORIDE 0.9 % IV SOLN
1.0000 g | INTRAVENOUS | Status: DC
  Administered 2024-02-01: 1 g via INTRAVENOUS
  Filled 2024-01-31 (×2): qty 10

## 2024-01-31 MED ORDER — ISOSORBIDE MONONITRATE ER 60 MG PO TB24
120.0000 mg | ORAL_TABLET | Freq: Every day | ORAL | Status: DC
  Administered 2024-02-01 – 2024-02-02 (×2): 120 mg via ORAL
  Filled 2024-01-31 (×2): qty 2

## 2024-01-31 MED ORDER — SODIUM CHLORIDE 0.9 % IV SOLN
2.0000 g | Freq: Once | INTRAVENOUS | Status: AC
  Administered 2024-01-31: 2 g via INTRAVENOUS
  Filled 2024-01-31: qty 20

## 2024-01-31 MED ORDER — EZETIMIBE 10 MG PO TABS
10.0000 mg | ORAL_TABLET | Freq: Every day | ORAL | Status: DC
  Administered 2024-01-31 – 2024-02-01 (×2): 10 mg via ORAL
  Filled 2024-01-31 (×2): qty 1

## 2024-01-31 MED ORDER — ACETAMINOPHEN 650 MG RE SUPP: 650.0000 mg | Freq: Four times a day (QID) | RECTAL | Status: DC | PRN

## 2024-01-31 MED ORDER — ROSUVASTATIN CALCIUM 10 MG PO TABS
10.0000 mg | ORAL_TABLET | Freq: Every day | ORAL | Status: DC
  Administered 2024-02-01 – 2024-02-02 (×2): 10 mg via ORAL
  Filled 2024-01-31 (×2): qty 1

## 2024-01-31 MED ORDER — ONDANSETRON HCL 4 MG/2ML IJ SOLN: 4.0000 mg | Freq: Four times a day (QID) | INTRAMUSCULAR | Status: DC | PRN

## 2024-01-31 MED ORDER — PROSIGHT PO TABS
1.0000 | ORAL_TABLET | Freq: Every day | ORAL | Status: DC
  Administered 2024-02-01 – 2024-02-02 (×2): 1 via ORAL
  Filled 2024-01-31 (×3): qty 1

## 2024-01-31 MED ORDER — SODIUM CHLORIDE 0.9 % IV SOLN: INTRAVENOUS | Status: AC

## 2024-01-31 NOTE — Assessment & Plan Note (Signed)
 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

## 2024-01-31 NOTE — IPAL (Signed)
  Interdisciplinary Goals of Care Family Meeting   Date carried out: 01/31/2024  Location of the meeting: Bedside  Member's involved: Physician, Bedside Registered Nurse, and Family Member or next of kin  Durable Power of Attorney or acting medical decision maker: daughter Alexa    Discussion: We discussed goals of care for [employer] .   I have reviewed medical records including EPIC notes, labs and imaging, assessed the patient and t discuss major active diagnoses, plan of care, natural trajectory, prognosis, GOC, EOL wishes, disposition and options including Full code/DNI/DNR and the concept of comfort care if DNR is elected. Questions and concerns were addressed. They are  in agreement to continue current plan of care . Election for DNR status.   Code status:   Code Status: Do not attempt resuscitation (DNR) PRE-ARREST INTERVENTIONS DESIRED   Disposition: Continue current acute care  Time spent for the meeting: 30    Delayne LULLA Solian, MD  01/31/2024, 11:50 PM

## 2024-01-31 NOTE — Assessment & Plan Note (Signed)
 Troponins normal 8--> 10 and EKG nonacute Patient denies chest pain Continue isosorbide , metoprolol , rosuvastatin , nitroglycerin  sublingual as needed chest pain and acetaminophen 

## 2024-01-31 NOTE — ED Provider Notes (Signed)
 [facility] Provider Note    Event Date/Time   First MD Initiated Contact with Patient 01/31/24 1633     (approximate)   History   Weakness   HPI  Alexis Perez is a 89 y.o. female  CAD s/p stent in 2022, HTN, dementia, history of urinary tract infection, HLD, history of hyponatremia who comes in with concerns for vomiting and weakness.  Patient was started on a p.o. antibiotic Macrobid  yesterday has taken 2 doses.  She started feeling weak and have vomiting today   I reviewed a note from 12/21/2023 where patient was admitted for UTI/syncope.  She did have positive blood cultures for E. coli.  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  This was decided to be managed conservatively.  Patient was seen on 7/22.  She had put her old pessary back and in her urine done the office from urology was nitrite positive with greater than 30 WBCs and many bacteria.  They sent it for culture and given she had no significant symptoms they put her on Macrobid  100 twice daily for 5 days.  Patient reports taking doses yesterday and this morning started to feel really weak and nauseous.  Reports that she may not tolerate Macrobid  very well.  She denies any abdominal pain, falls or hitting her head, chest pain, shortness of breath.  Has some baseline swelling in her legs.  Physical Exam   Triage Vital Signs: ED Triage Vitals  Encounter Vitals Group     BP 01/31/24 1636 (!) 171/74     Girls Systolic BP Percentile --      Girls Diastolic BP Percentile --      Boys Systolic BP Percentile --      Boys Diastolic BP Percentile --      Pulse Rate 01/31/24 1636 91     Resp 01/31/24 1636 16     Temp 01/31/24 1636 98.4 F (36.9 C)     Temp Source 01/31/24 1636 Oral     SpO2 01/31/24 1636 99 %     Weight 01/31/24 1633 138 lb 0.1 oz (62.6 kg)     Height 01/31/24 1633 5' (1.524 m)     Head Circumference --      Peak Flow --      Pain Score 01/31/24 1633 0     Pain Loc --      Pain  Education --      Exclude from Growth Chart --     Most recent vital signs: Vitals:   01/31/24 1636  BP: (!) 171/74  Pulse: 91  Resp: 16  Temp: 98.4 F (36.9 C)  SpO2: 99%     General: Awake, no distress.  CV:  Good peripheral perfusion.  Resp:  Normal effort.  Abd:  No distention.  Abdomen soft and nontender Other:  Alert and oriented x 3.  Some baseline swelling in the legs. No trauma noted.  Moving all extremities  ED Results / Procedures / Treatments   Labs (all labs ordered are listed, but only abnormal results are displayed) Labs Reviewed  COMPREHENSIVE METABOLIC PANEL WITH GFR - Abnormal; Notable for the following components:      Result Value   Glucose, Bld 155 (*)    BUN 29 (*)    All other components within normal limits  CBC WITH DIFFERENTIAL/PLATELET - Abnormal; Notable for the following components:   WBC 10.8 (*)    Neutro Abs 9.6 (*)    Lymphs Abs 0.5 (*)  All other components within normal limits  URINALYSIS, W/ REFLEX TO CULTURE (INFECTION SUSPECTED) - Abnormal; Notable for the following components:   Color, Urine YELLOW (*)    APPearance HAZY (*)    Glucose, UA 50 (*)    Leukocytes,Ua MODERATE (*)    Bacteria, UA RARE (*)    All other components within normal limits  CULTURE, BLOOD (ROUTINE X 2)  CULTURE, BLOOD (ROUTINE X 2)  LACTIC ACID, PLASMA  PROTIME-INR  TROPONIN I (HIGH SENSITIVITY)  TROPONIN I (HIGH SENSITIVITY)     EKG  My interpretation of EKG:  Sinus rate of 91 without any ST elevation or T wave inversions except for lead III, normal intervals  RADIOLOGY    PROCEDURES:  Critical Care performed: No  .1-3 Lead EKG Interpretation  Performed by: Ernest Ronal BRAVO, MD Authorized by: Ernest Ronal BRAVO, MD     Interpretation: normal     ECG rate:  90   ECG rate assessment: normal     Rhythm: sinus rhythm     Ectopy: none     Conduction: normal      MEDICATIONS ORDERED IN ED: Medications  cefTRIAXone  (ROCEPHIN ) 2 g in  sodium chloride  0.9 % 100 mL IVPB (has no administration in time range)  ondansetron  (ZOFRAN ) injection 4 mg (4 mg Intravenous Given 01/31/24 1711)     IMPRESSION / MDM / ASSESSMENT AND PLAN / ED COURSE  I reviewed the triage vital signs and the nursing notes.   Patient's presentation is most consistent with acute presentation with potential threat to life or bodily function.   Patient comes in with increasing weakness, nausea.  Workup was done evaluate for electrolyte abnormalities, AKI.  UA with concerns for UTI patient did have a culture a few days ago that was positive.  Lactate normal CMP shows elevated BUN.  CBC slightly elevated white count.  At this time patient does not meet sepsis criteria but given her recent history of bacteremia I did discuss with family.  They report that she is much more fatigued and more concerned about going home on a different p.o. antibiotic given she has failed outpatient treatment for UTI will admit patient for IV antibiotics.  The patient is on the cardiac monitor to evaluate for evidence of arrhythmia and/or significant heart rate changes.      FINAL CLINICAL IMPRESSION(S) / ED DIAGNOSES   Final diagnoses:  Weakness  Acute cystitis without hematuria     Rx / DC Orders   ED Discharge Orders     None        Note:  This document was prepared using Dragon voice recognition software and may include unintentional dictation errors.   Ernest Ronal BRAVO, MD 01/31/24 [phone]

## 2024-01-31 NOTE — Assessment & Plan Note (Signed)
 Discussed with family at bedside.  Patient is DNR

## 2024-01-31 NOTE — Assessment & Plan Note (Addendum)
 Probable acute metabolic encephalopathy Patient presented with weakness, somnolence in the setting of acute infection Rapid response event due to possible passing out episode Patient was arousable on calling name during rapid response and vitals were stable No focal neurologic deficit  CT head nonacute Echo in June 2025 showed EF 60 to 65% and G1 DD  Neurologic checks Close monitoring in PCU Will get CTA chest in view of slightly elevated D-dimer of 0.53 in the setting of fairly recent hospitalization--->negative for PE

## 2024-01-31 NOTE — H&P (Addendum)
 History and Physical    Patient: Alexis Perez FMW:[id number] DOB: 10-22-1923 DOA: 01/31/2024 DOS: the patient was seen and examined on 01/31/2024 PCP: Warren Cramp, MD  Patient coming from: Home  Chief Complaint:  Chief Complaint  Patient presents with   Weakness   HPI: Alexis Perez is a 89 y.o. female with medical history significant of CAD s/p stent in 2022, HTN, dementia being admitted with a complicated UTI.  Upon arrival to the floor, rapid response was called overhead due to an acute change in mentation. Patient was initially brought to the emergency room with a 3-day history of weakness, somnolence and confusion with nausea in the setting of outpatient antibiotic therapy prescribed by her urologist on 01/21/2024 for UTI. Of note, patient was hospitalized in June for sepsis secondary to E. coli UTI presenting with syncope and collapse.  She was noted to have right hydronephrosis with UPJ stenosis.  On her follow-up appointment with urology on 01/21/2024, family declined further workup after discussion of risks and benefits in view of her advanced age. On arrival in the ED blood pressure was slightly elevated but vitals were otherwise within normal limits.  Labs were notable for WBC of 10,800 Alysis showed moderate leuks with rare bacteria.  No other concerning findings on labs.  EKG showed normal sinus rhythm and chest x-ray was clear. Admission was requested for IV antibiotics as patient was not tolerating oral antibiotics. Following arrival to the floor, history is given by daughter at bedside, her mother became somnolent and appeared to lose consciousness/pass out. Upon my arrival to the bedside along with rapid response team, patient was somnolent but arousing easily to her name being called.  Vitals were within normal limits except for slightly elevated blood pressure with SBP around 180.  O2 sat 94% on room air. patient was able to follow commands and NIHSS was 0 Blood sugar was 135.  EKG  nonacute, O2 sat Additional tests ordered (repeat troponin, D dimer, CT head). patient transferred from MedSurg floor to PCU.    Past Medical History:  Diagnosis Date   Hypertension    Past Surgical History:  Procedure Laterality Date   ANKLE FRACTURE SURGERY Left 1999   Social History:  reports that she has never smoked. She has never used smokeless tobacco. She reports that she does not drink alcohol  and does not use drugs.  Allergies  Allergen Reactions   Fentanyl Nausea And Vomiting   Sodium Pentobarbital [Pentobarbital] Anaphylaxis    When pt was sedated for surgery   Hydrochlorothiazide Other (See Comments)    hyponatremia   Ciprofloxacin      Blurry vision, sleepy and make pt sick   Codeine Other (See Comments)    Other reaction(s): UNKNOWN   Penicillin G Rash   Sulfa Antibiotics Rash    No family history on file.  Prior to Admission medications   Medication Sig Start Date End Date Taking? Authorizing Provider  amLODipine  (NORVASC ) 5 MG tablet Take 1 tablet (5 mg total) by mouth daily. 01/08/24   Loistine Sober, NP  aspirin  EC 81 MG tablet Take 81 mg by mouth daily. Swallow whole.    [provider]  Cholecalciferol  50 MCG (2000 UT) CAPS Take 1 capsule by mouth daily.    [provider]  clopidogrel  (PLAVIX ) 75 MG tablet Take 1 tablet (75 mg total) by mouth daily. 01/08/24   Loistine Sober, NP  estradiol  (ESTRACE  VAGINAL) 0.1 MG/GM vaginal cream Apply 0.5mg  (pea-sized amount)  just inside the vaginal introitus  with a finger-tip on Monday, Wednesday and Friday nights. 01/21/24   Helon Kirsch A, PA-C  ezetimibe  (ZETIA ) 10 MG tablet Take 10 mg by mouth at bedtime. 07/22/21   [provider]  isosorbide  mononitrate (IMDUR ) 30 MG 24 hr tablet Take 120 mg by mouth daily. 08/20/21   [provider]  metoprolol  succinate (TOPROL -XL) 100 MG 24 hr tablet Take 1 tablet (100 mg total) by mouth at bedtime. 01/08/24   Loistine Sober, NP   Multiple Vitamins-Minerals (PRESERVISION AREDS 2) CAPS Take 1 capsule by mouth daily. 03/19/19   [provider]  nitrofurantoin , macrocrystal-monohydrate, (MACROBID ) 100 MG capsule Take 1 capsule (100 mg total) by mouth 2 (two) times daily for 5 days. 01/30/24 02/04/24  Vaillancourt, Samantha, PA-C  nitroGLYCERIN  (NITROSTAT ) 0.4 MG SL tablet Place under the tongue. 07/07/21   [provider]  rosuvastatin  (CRESTOR ) 10 MG tablet Take 1 tablet (10 mg total) by mouth daily. Patient taking differently: Take 10 mg by mouth as needed. 12/21/23   Josette Ade, MD    Physical Exam: Vitals:   01/31/24 1855 01/31/24 1930 01/31/24 2028 01/31/24 2052  BP:  (!) 147/72 (!) 161/80 (!) 180/82  Pulse:   (!) 104 (!) 104  Resp:  (!) 21 (!) 28   Temp: 97.9 F (36.6 C)  97.8 F (36.6 C)   TempSrc: Oral     SpO2:   97% 95%  Weight:   59.9 kg   Height:   5' (1.524 m)    Physical Exam Vitals and nursing note reviewed.  Constitutional:      General: She is sleeping. She is not in acute distress.    Comments: Somnolent, frail-appearing but in no distress  HENT:     Head: Normocephalic and atraumatic.  Cardiovascular:     Rate and Rhythm: Regular rhythm. Tachycardia present.     Heart sounds: Normal heart sounds.  Pulmonary:     Effort: Pulmonary effort is normal.     Breath sounds: Normal breath sounds.  Abdominal:     Palpations: Abdomen is soft.     Tenderness: There is no abdominal tenderness.  Neurological:     General: No focal deficit present.     Mental Status: She is easily aroused. She is lethargic.     Data Reviewed: Relevant notes from primary care and specialist visits, past discharge summaries as available in EHR, including Care Everywhere. 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 Rocephin  Follow cultures Not meeting sepsis criteria  Postural dizziness with presyncope Probable acute metabolic encephalopathy Patient presented with weakness, somnolence in the setting of acute infection Rapid response event due to possible passing out episode Patient was arousable on calling name during rapid response and vitals were stable No focal neurologic deficit  CT head nonacute Echo in June 2025 showed EF 60 to 65% and G1 DD  Neurologic checks Close monitoring in PCU Will get CTA chest in view of slightly elevated D-dimer of 0.53 in the setting of fairly recent hospitalization--->negative for PE  Coronary artery disease Troponins normal 8--> 10 and EKG nonacute Patient denies chest pain Continue isosorbide , metoprolol , rosuvastatin , nitroglycerin  sublingual as needed chest pain and acetaminophen   Essential hypertension  Blood pressure elevated Resume home meds of amlodipine , metoprolol , Imdur   Advanced age Discussed with family at bedside.  Patient is DNR      Advance Care Planning:   Code Status: Do not attempt resuscitation (DNR) PRE-ARREST INTERVENTIONS DESIRED   Consults: none  Family Communication: daughter at bedside  Severity of Illness: The appropriate patient status for this patient is OBSERVATION. Observation status is judged to be reasonable and necessary in order to provide the required intensity of service to ensure the patient's safety. The patient's presenting symptoms, physical exam findings, and initial radiographic and laboratory data in the context of their medical condition is felt to place them at decreased risk for further clinical deterioration. Furthermore, it is anticipated that the patient will be medically stable for discharge from the hospital within 2  midnights of admission.   CRITICAL CARE Performed by: Delayne LULLA Solian   Total critical care time: 100 minutes  Critical care time was exclusive of separately billable procedures and treating other patients.  Critical care was necessary to treat or prevent imminent or life-threatening deterioration.  Critical care was time spent personally by me on the following activities: development of treatment plan with patient and/or surrogate as well as nursing, discussions with consultants, evaluation of patient's response to treatment, examination of patient, obtaining history from patient or surrogate, ordering and performing treatments and interventions, ordering and review of laboratory studies, ordering and review of radiographic studies, pulse oximetry and re-evaluation of patient's condition.  Author: Delayne LULLA Solian, MD 01/31/2024 9:07 PM  For on call review [email or web address].

## 2024-01-31 NOTE — ED Triage Notes (Signed)
 BIBEMS from home. c/o vomiting and weakness that started this AM. Pt started taking PO ABX Macrobid  yesterday, has only taken 2x. Pt reports same symptoms when taken the oral ABX before. VSS. GCS 15. DNR @bedside .

## 2024-01-31 NOTE — Significant Event (Signed)
 Rapid Response Event Note   Reason for Call : Altered mental status  Initial Focused Assessment:  Upon arrival, patient is resting comfortably in bed, no increased work of breathing. Daughter states the patient has become less rousable since being in the ER, states she was told she shouldn't have even come up here but they sent her anyway.  Daughter states while sitting with the patient she watched her stop breathing. The daughter is supportive, cooperative and understandably anxious.   Upon assessment, Alexis Perez is alert and oriented x4, she has weak to moderate strength in all extremities and equal throughout. The patient has equal and reactive pupils. She is following all commands and answering all questions appropriately. Her only complaints are pain in the back of her head and being cold during EKG and assessment. The patient remains on room air with no difficulty breathing.   Interventions:  Neuro assessment EKG Labwork Vital signs CBG CT head  Plan of Care:  Transfer to PCU [facility] CT head EKG  Event Summary:   MD Notified: Dr. Cleatus Call Time: 2038 Arrival Time: 2038 End Time: 2055  Warren Forbes, RN

## 2024-01-31 NOTE — Hospital Course (Signed)
 CAD s/p stent in 2022, HTN, dementia being admitted with a complicated UTI.  Upon arrival to the floor, rapid response was called overhead due to an acute change in mentation. Patient was initially brought to the emergency room with a 3-day history of weakness, somnolence and confusion with nausea in the setting of outpatient antibiotic therapy prescribed by her urologist on 01/21/2024 for UTI. Of note, patient was hospitalized in June for sepsis secondary to E. coli UTI presenting with syncope and collapse.  She was noted to have right hydronephrosis with UPJ stenosis.  On her follow-up appointment with urology on 01/21/2024, family declined further workup after discussion of risks and benefits in view of her advanced age. On arrival in the ED blood pressure was slightly elevated but vitals were otherwise within normal limits.  Labs were notable for WBC of 10,800 Alysis showed moderate leuks with rare bacteria.  No other concerning findings on labs.  EKG showed normal sinus rhythm and chest x-ray was clear. Admission was requested for IV antibiotics as patient was not tolerating oral antibiotics. Following arrival to the floor, history is given by daughter at bedside, her mother became somnolent and appeared to lose consciousness/pass out. Upon my arrival to the bedside along with rapid response team, patient was somnolent but arousing easily to her name being called.  Vitals were within normal limits except for slightly elevated blood pressure with SBP around 180.  O2 sat 94% on room air. patient was able to follow commands and NIHSS was 0 Blood sugar was 135.  EKG nonacute, O2 sat Additional tests ordered (repeat troponin, D dimer, CT head). patient transferred from MedSurg floor to PCU.

## 2024-01-31 NOTE — Assessment & Plan Note (Signed)
 Blood pressure elevated Resume home meds of amlodipine , metoprolol , Imdur 

## 2024-01-31 NOTE — Progress Notes (Signed)
   01/31/24 2038  Spiritual Encounters  Type of Visit Initial  Care provided to: Pt and family (Daughter at bedside)  [place] partners present during encounter Other (comment);Nurse (Rapid Response Team)  Referral source Code page;Other (comment) (Rapid Response)  Reason for visit Code  OnCall Visit Yes  Spiritual Framework  Presenting Themes Goals in life/care;Values and beliefs;Impactful experiences and emotions  Values/beliefs Pt is Catholic  Family Stress Factors Other (Comment) (Pt is still helping others in the community. Family doesn't want it assumed that Pt is a typical 89 y.o. She has been very independent.)  Interventions  Spiritual Care Interventions Made Established relationship of care and support;Compassionate presence;Reflective listening;Normalization of emotions;Narrative/life review;Meaning making  Intervention Outcomes  Outcomes Connection to spiritual care;Awareness around self/spiritual resourses;Connection to values and goals of care;Awareness of health;Awareness of support

## 2024-02-01 ENCOUNTER — Other Ambulatory Visit

## 2024-02-01 ENCOUNTER — Encounter: Payer: Self-pay | Admitting: Internal Medicine

## 2024-02-01 ENCOUNTER — Observation Stay

## 2024-02-01 DIAGNOSIS — R42 Dizziness and giddiness: Secondary | ICD-10-CM

## 2024-02-01 DIAGNOSIS — R55 Syncope and collapse: Secondary | ICD-10-CM | POA: Diagnosis not present

## 2024-02-01 DIAGNOSIS — N390 Urinary tract infection, site not specified: Secondary | ICD-10-CM | POA: Diagnosis not present

## 2024-02-01 LAB — BASIC METABOLIC PANEL WITH GFR
Anion gap: 7 (ref 5–15)
BUN: 16 mg/dL (ref 8–23)
CO2: 27 mmol/L (ref 22–32)
Calcium: 8.5 mg/dL — ABNORMAL LOW (ref 8.9–10.3)
Chloride: 102 mmol/L (ref 98–111)
Creatinine, Ser: 0.33 mg/dL — ABNORMAL LOW (ref 0.44–1.00)
GFR, Estimated: >60 mL/min (ref >60)
Glucose, Bld: 114 mg/dL — ABNORMAL HIGH (ref 70–99)
Potassium: 3.2 mmol/L — ABNORMAL LOW (ref 3.5–5.1)
Sodium: 136 mmol/L (ref 135–145)

## 2024-02-01 LAB — CBC
HCT: 33.6 % — ABNORMAL LOW (ref 36.0–46.0)
Hemoglobin: 11 g/dL — ABNORMAL LOW (ref 12.0–15.0)
MCH: 30.6 pg (ref 26.0–34.0)
MCHC: 32.7 g/dL (ref 30.0–36.0)
MCV: 93.3 fL (ref 80.0–100.0)
Platelets: 196 K/uL (ref 150–400)
RBC: 3.6 MIL/uL — ABNORMAL LOW (ref 3.87–5.11)
RDW: 14 % (ref 11.5–15.5)
WBC: 9 K/uL (ref 4.0–10.5)
nRBC: 0 % (ref 0.0–0.2)

## 2024-02-01 LAB — MAGNESIUM: Magnesium: 1.9 mg/dL (ref 1.7–2.4)

## 2024-02-01 LAB — PHOSPHORUS: Phosphorus: 2.9 mg/dL (ref 2.5–4.6)

## 2024-02-01 MED ORDER — POTASSIUM CHLORIDE 20 MEQ PO PACK
40.0000 meq | PACK | Freq: Once | ORAL | Status: AC
  Administered 2024-02-01: 40 meq via ORAL
  Filled 2024-02-01: qty 2

## 2024-02-01 MED ORDER — IOHEXOL 350 MG/ML SOLN
75.0000 mL | Freq: Once | INTRAVENOUS | Status: AC | PRN
  Administered 2024-02-01: 75 mL via INTRAVENOUS

## 2024-02-01 NOTE — Plan of Care (Signed)

## 2024-02-01 NOTE — Care Management Obs Status (Signed)
 MEDICARE OBSERVATION STATUS NOTIFICATION   Patient Details  Name: Alexis Perez MRN: [id number] Date of Birth: 05/17/1924   Medicare Observation Status Notification Given:  No (patient didnot want a copy)    Alexis Perez 02/01/2024, 1:15 PM

## 2024-02-01 NOTE — Plan of Care (Signed)

## 2024-02-01 NOTE — Progress Notes (Addendum)
 Progress Note    Alexis Perez  FMW:[id number] DOB: [birth year]  DOA: 01/31/2024 PCP: Warren Cramp, MD      Brief Narrative:    Medical records reviewed and are as summarized below:  Alexis Perez is a 89 y.o. female  with medical history significant of CAD s/p stent in 2022, HTN, dementia, hospitalized in June for sepsis secondary to E. coli UTI with syncope and collapse.  Found to have right moderate hydronephrosis, right UPJ stenosis and obstructive uropathy 12/17/2023, recent diagnosis with E. coli UTI from culture obtained on 01/21/2024.  She only started taking Macrodantin  who was brought to the hospital Thursday evening (730 07/2023) and took the second dose the following morning.  However, she felt very sick from this medicine.  She felt this way in the past when she had taken Macrodantin  some years ago. She presented to the hospital on 01/31/2024 because of general weakness, nausea and vomiting.  She was admitted to the hospital for acute complicated UTI. Upon arriving to the medical floor, rapid response was called because of acute change in mentation.  Apparently, she became more somnolent and appeared to lose consciousness/passed out.  Rapid response team arrived, patient was somnolent but easily arousable.  Reportedly, NIHSS was 0   No acute abnormality on CT head but there is evidence of chronic ischemic white matter changes, arthrosclerosis of the skull base.  No acute abnormality on CTA chest otherwise evidence of cardiomegaly, more prominent mediastinal and right hilar lymph nodes which are nonspecific and aortic atherosclerosis.  Urinalysis was consistent with UTI.      Assessment/Plan:   Principal Problem:   Complicated urinary tract infection Active Problems:   Hydronephrosis, right   Postural dizziness with presyncope   Coronary artery disease   Essential hypertension   Advanced age    Body mass index is 25.79 kg/m.    Acute complicated UTI,  underlying right moderate hydronephrosis, right UPJ stenosis: Continue IV ceftriaxone .  Continue IV fluids.  Follow-up urine culture. Previous urine culture from 01/21/2024 showed E. coli that is pansensitive. She had started Macrodantin  prior to admission but she said she is intolerant of Macrodantin .   Acute metabolic encephalopathy: Resolved. Postural dizziness with presyncope: Resolved Minor neurocognitive impairment: She appears to be at baseline.   Hypokalemia: Replete potassium and monitor levels   CAD with coronary stent: Troponins negative.  Continue pain, isosorbide  mononitrate, metoprolol  and rosuvastatin .   Hypertension: Continue antihypertensives   Plan discussed with [name], daughter, at the bedside   Diet Order             Diet Heart Room service appropriate? Yes; Fluid consistency: Thin  Diet effective now                            Consultants: None  Procedures: None    Medications:    amLODipine   5 mg Oral Daily   aspirin  EC  81 mg Oral Daily   clopidogrel   75 mg Oral Daily   enoxaparin  (LOVENOX ) injection  40 mg Subcutaneous Q24H   ezetimibe   10 mg Oral QHS   isosorbide  mononitrate  120 mg Oral Daily   metoprolol  succinate  100 mg Oral QHS   multivitamin  1 tablet Oral Daily   rosuvastatin   10 mg Oral Daily   Continuous Infusions:  sodium chloride  100 mL/hr at 02/01/24 1243   cefTRIAXone  (ROCEPHIN )  IV       Anti-infectives (From  admission, onward)    Start     Dose/Rate Route Frequency Ordered Stop   02/01/24 1800  cefTRIAXone  (ROCEPHIN ) 1 g in sodium chloride  0.9 % 100 mL IVPB        1 g 200 mL/hr over 30 Minutes Intravenous Every 24 hours 01/31/24 2052     01/31/24 1845  cefTRIAXone  (ROCEPHIN ) 2 g in sodium chloride  0.9 % 100 mL IVPB        2 g 200 mL/hr over 30 Minutes Intravenous  Once 01/31/24 1838 01/31/24 1932              Family Communication/Anticipated D/C date and plan/Code Status   DVT  prophylaxis: enoxaparin  (LOVENOX ) injection 40 mg Start: 01/31/24 2200     Code Status: Do not attempt resuscitation (DNR) PRE-ARREST INTERVENTIONS DESIRED  Family Communication: Plan discussed with [name], daughter, at the bedside Disposition Plan: Plan to discharge home   Status is: Observation The patient will require care spanning > 2 midnights and should be moved to inpatient because: Acute UTI       Subjective:   Interval events noted.  She feels much better today.  She has no complaints.  [name], daughter, was at the bedside  Objective:    Vitals:   02/01/24 0355 02/01/24 0753 02/01/24 1013 02/01/24 1228  BP: (!) 162/74 (!) 166/72 (!) 166/72 (!) 144/67  Pulse: 79 82  74  Resp: 19 16  16   Temp: 98.4 F (36.9 C) 98.4 F (36.9 C)  98.5 F (36.9 C)  TempSrc: Oral Oral  Oral  SpO2: 96% 95%  97%  Weight:      Height:       No data found.   Intake/Output Summary (Last 24 hours) at 02/01/2024 1319 Last data filed at 02/01/2024 1200 Gross per 24 hour  Intake 996.67 ml  Output 1300 ml  Net -303.33 ml   Filed Weights   01/31/24 1633 01/31/24 2028  Weight: 62.6 kg 59.9 kg    Exam:  GEN: NAD SKIN: Warm and dry EYES: No pallor or icterus ENT: MMM CV: RRR PULM: CTA B ABD: soft, ND, NT, +BS CNS: AAO x 3, non focal EXT: No edema or tenderness        Data Reviewed:   I have personally reviewed following labs and imaging studies:  Labs: Labs show the following:   Basic Metabolic Panel: Recent Labs  Lab 01/31/24 1653 02/01/24 0536  NA 136 136  K 3.9 3.2*  CL 99 102  CO2 27 27  GLUCOSE 155* 114*  BUN 29* 16  CREATININE 0.58 0.33*  CALCIUM  9.5 8.5*  MG  --  1.9  PHOS  --  2.9   GFR Estimated Creatinine Clearance: 31.8 mL/min (A) (by C-G formula based on SCr of 0.33 mg/dL (L)). Liver Function Tests: Recent Labs  Lab 01/31/24 1653  AST 23  ALT 16  ALKPHOS 69  BILITOT 0.2  PROT 7.3  ALBUMIN 3.9   No results for input(s):  LIPASE, AMYLASE in the last 168 hours. No results for input(s): AMMONIA in the last 168 hours. Coagulation profile Recent Labs  Lab 01/31/24 1653  INR 1.0    CBC: Recent Labs  Lab 01/31/24 1653 02/01/24 0536  WBC 10.8* 9.0  NEUTROABS 9.6*  --   HGB 12.8 11.0*  HCT 40.6 33.6*  MCV 96.0 93.3  PLT 247 196   Cardiac Enzymes: No results for input(s): CKTOTAL, CKMB, CKMBINDEX, TROPONINI in the last 168 hours. BNP (last 3  results) No results for input(s): PROBNP in the last 8760 hours. CBG: Recent Labs  Lab 01/31/24 2050  GLUCAP 135*   D-Dimer: Recent Labs    01/31/24 2103  DDIMER 0.53*   Hgb A1c: No results for input(s): HGBA1C in the last 72 hours. Lipid Profile: No results for input(s): CHOL, HDL, LDLCALC, TRIG, CHOLHDL, LDLDIRECT in the last 72 hours. Thyroid function studies: No results for input(s): TSH, T4TOTAL, T3FREE, THYROIDAB in the last 72 hours.  Invalid input(s): FREET3 Anemia work up: No results for input(s): VITAMINB12, FOLATE, FERRITIN, TIBC, IRON, RETICCTPCT in the last 72 hours. Sepsis Labs: Recent Labs  Lab 01/31/24 1653 02/01/24 0536  WBC 10.8* 9.0  LATICACIDVEN 1.4  --     Microbiology Recent Results (from the past 240 hours)  Blood Culture (routine x 2)     Status: None (Preliminary result)   Collection Time: 01/31/24  4:53 PM   Specimen: BLOOD RIGHT ARM  Result Value Ref Range Status   Specimen Description   Final    BLOOD RIGHT ARM Performed at [facility], [address], [place], [place] [address]    Special Requests   Final    BOTTLES DRAWN AEROBIC AND ANAEROBIC Blood Culture results may not be optimal due to an inadequate volume of blood received in culture bottles   Culture   Final    NO GROWTH < 24 HOURS Performed at [facility], [address] [place], [place] [address]    Report Status PENDING  Incomplete  Blood Culture (routine x 2)     Status:  None (Preliminary result)   Collection Time: 01/31/24  4:53 PM   Specimen: BLOOD LEFT ARM  Result Value Ref Range Status   Specimen Description   Final    BLOOD LEFT ARM Performed at [facility], [address] [place], [place] [address]    Special Requests   Final    BOTTLES DRAWN AEROBIC AND ANAEROBIC Blood Culture adequate volume   Culture   Final    NO GROWTH < 24 HOURS Performed at [facility], [address] [place], [place] [address]    Report Status PENDING  Incomplete    Procedures and diagnostic studies:  CT Angio Chest Pulmonary Embolism (PE) W or WO Contrast Result Date: 02/01/2024 CLINICAL DATA:  Mental status change possible sepsis EXAM: CT ANGIOGRAPHY CHEST WITH CONTRAST TECHNIQUE: Multidetector CT imaging of the chest was performed using the standard protocol during bolus administration of intravenous contrast. Multiplanar CT image reconstructions and MIPs were obtained to evaluate the vascular anatomy. RADIATION DOSE REDUCTION: This exam was performed according to the departmental dose-optimization program which includes automated exposure control, adjustment of the mA and/or kV according to patient size and/or use of iterative reconstruction technique. CONTRAST:  75mL OMNIPAQUE  IOHEXOL  350 MG/ML SOLN COMPARISON:  Chest x-ray 01/31/2024, chest CT 12/17/2023 FINDINGS: Cardiovascular: Satisfactory opacification of the pulmonary arteries to the segmental level. No evidence of pulmonary embolism. Advanced aortic atherosclerosis. No aneurysm or dissection. Advanced multilevel coronary vascular calcification. Cardiomegaly. No pericardial effusion. Mediastinum/Nodes: Patent trachea. 3.2 cm left thyroid mass. In the setting of significant comorbidities or limited life expectancy, no follow-up recommended (ref: J Am Coll Radiol. 2015 Feb;12(2): 143-50). Mildly prominent mediastinal lymph nodes. Precarinal lymph node measures up to 12 mm. Small right hilar nodes up to 10  mm. Subcarinal lymph node measuring up to 10 mm. Esophagus is unremarkable. Lungs/Pleura: Mild bilateral subpleural reticulation. Scattered tubal pulmonary nodules, largest is  seen in the right upper lobe and measures up to 4 mm, no specific imaging follow-up is recommended. No acute airspace disease, pleural effusion or pneumothorax Upper Abdomen: Multiple hepatic cysts.  No acute findings. Musculoskeletal: No acute osseous abnormality Review of the MIP images confirms the above findings. IMPRESSION: 1. Negative for acute pulmonary embolus. 2. Cardiomegaly.  No pleural effusion or focal airspace disease. 3. Mildly prominent mediastinal and right hilar lymph nodes, nonspecific. 4. Aortic atherosclerosis. Aortic Atherosclerosis (ICD10-I70.0). Electronically Signed   By: Luke Bun M.D.   On: 02/01/2024 00:57   CT HEAD WO CONTRAST ( ) Result Date: 01/31/2024 EXAM: CT HEAD WITHOUT 01/31/2024 09:14:08 PM TECHNIQUE: CT of the head was performed without the administration of intravenous contrast. Automated exposure control, iterative reconstruction, and/or weight based adjustment of the mA/kV was utilized to reduce the radiation dose to as low as reasonably achievable. COMPARISON: None available. CLINICAL HISTORY: Mental status change, unknown cause. FINDINGS: BRAIN AND VENTRICLES: No acute intracranial hemorrhage. No mass effect or midline shift. No extra-axial fluid collection. Gray-white differentiation is maintained. No hydrocephalus. Chronic ischemic white matter changes. ORBITS: No acute abnormality. SINUSES AND MASTOIDS: No acute abnormality. SOFT TISSUES AND SKULL: No acute skull fracture. No acute soft tissue abnormality. Atherosclerosis at the skull base. IMPRESSION: 1. No acute intracranial abnormality. 2. Chronic ischemic white matter changes and atherosclerosis at the skull base. Electronically signed by: Franky Stanford MD 01/31/2024 09:34 PM EDT RP Workstation: [id number]   DG Chest Port 1  View Result Date: 01/31/2024 CLINICAL DATA:  Questionable sepsis - evaluate for abnormality. EXAM: PORTABLE CHEST 1 VIEW COMPARISON:  12/17/2023. FINDINGS: There are diffuse increased interstitial markings, essentially similar to the prior study. Findings are nonspecific and differential diagnosis includes pulmonary edema, chronic interstitial lung disease, etc. Bilateral lung fields are otherwise clear. No acute consolidation or lung collapse. Bilateral costophrenic angles are clear. Stable cardio-mediastinal silhouette. No acute osseous abnormalities. The soft tissues are within normal limits. IMPRESSION: *Diffuse increased interstitial markings, essentially similar to the prior study. Findings are nonspecific and differential diagnosis includes pulmonary edema, chronic interstitial lung disease, etc. Electronically Signed   By: Ree Molt M.D.   On: 01/31/2024 17:03               LOS: 0 days   Kamari Buch  [facility]   Pager on [email or web address]. If 7PM-7AM, please contact night-coverage at [email or web address]     02/01/2024, 1:19 PM

## 2024-02-01 NOTE — Progress Notes (Signed)
 Before the patient was admitted to the unit , both the patient daughter and granddaughter reported that the patient was not acting normally . The granddaughter requested that the patient be seem by Doctor at bedside. The patient 's daughter who was at bedside, stated that the patient needed to see a doctor immediately because she was not acting like herself.   As we were preparing to give a report to the doctor and requesting that doctor come to see the patient , the daughter again expressed concern that the patient was unresponsively .the  Daughter was so anxiety.   We entered the room and observed that the patient was not breathing well, gasped rapidly , MEWS was 3 , shortly after, the patient regained consciousness and became responsive again.   Called RRT , Dr Cleatus , decided to transfer the patient to another unit 2A due to her unstable condition and also the family was extremely anxious.   Ordered metoprolol  , vital was normal again .   [place], [place]

## 2024-02-02 DIAGNOSIS — N390 Urinary tract infection, site not specified: Secondary | ICD-10-CM | POA: Diagnosis not present

## 2024-02-02 LAB — BASIC METABOLIC PANEL WITH GFR
Anion gap: 8 (ref 5–15)
BUN: 15 mg/dL (ref 8–23)
CO2: 29 mmol/L (ref 22–32)
Calcium: 8.9 mg/dL (ref 8.9–10.3)
Chloride: 103 mmol/L (ref 98–111)
Creatinine, Ser: 0.46 mg/dL (ref 0.44–1.00)
GFR, Estimated: >60 mL/min (ref >60)
Glucose, Bld: 107 mg/dL — ABNORMAL HIGH (ref 70–99)
Potassium: 3.3 mmol/L — ABNORMAL LOW (ref 3.5–5.1)
Sodium: 140 mmol/L (ref 135–145)

## 2024-02-02 MED ORDER — CEPHALEXIN 500 MG PO CAPS: 500.0000 mg | ORAL_CAPSULE | Freq: Three times a day (TID) | ORAL | 0 refills | Status: AC

## 2024-02-02 MED ORDER — POTASSIUM CHLORIDE CRYS ER 20 MEQ PO TBCR
20.0000 meq | EXTENDED_RELEASE_TABLET | Freq: Once | ORAL | Status: AC
  Administered 2024-02-02: 20 meq via ORAL
  Filled 2024-02-02: qty 1

## 2024-02-02 MED ORDER — SODIUM CHLORIDE 0.9 % IV SOLN
1.0000 g | Freq: Once | INTRAVENOUS | Status: AC
  Administered 2024-02-02: 1 g via INTRAVENOUS
  Filled 2024-02-02: qty 10

## 2024-02-02 MED ORDER — POTASSIUM CHLORIDE CRYS ER 10 MEQ PO TBCR: 10.0000 meq | EXTENDED_RELEASE_TABLET | Freq: Every day | ORAL | 0 refills | Status: DC

## 2024-02-02 NOTE — Discharge Summary (Signed)
 Physician Discharge Summary   Patient: Alexis Perez MRN: [id number] DOB: 11/21/1923  Admit date:     01/31/2024  Discharge date: 02/02/24  Discharge Physician: AIDA CHO   PCP: Warren Cramp, MD   Recommendations at discharge:   Follow-up with PCP in 1 week  Discharge Diagnoses: Principal Problem:   Complicated urinary tract infection Active Problems:   Hydronephrosis, right   Postural dizziness with presyncope   Coronary artery disease   Essential hypertension   Advanced age  Resolved Problems:   * No resolved hospital problems. [facility] Course:   Alexis Perez is a 89 y.o. female  with medical history significant of CAD s/p stent in 2022, HTN, dementia, hospitalized in June for sepsis secondary to E. coli UTI with syncope and collapse.  Found to have right moderate hydronephrosis, right UPJ stenosis and obstructive uropathy 12/17/2023, recent diagnosis with E. coli UTI from culture obtained on 01/21/2024.  She only started taking Macrodantin  who was brought to the hospital Thursday evening (730 07/2023) and took the second dose the following morning.  However, she felt very sick from this medicine.  She felt this way in the past when she had taken Macrodantin  some years ago. She presented to the hospital on 01/31/2024 because of general weakness, nausea and vomiting.   She was admitted to the hospital for acute complicated UTI. Upon arriving to the medical floor, rapid response was called because of acute change in mentation.  Apparently, she became more somnolent and appeared to lose consciousness/passed out.   Rapid response team arrived, patient was somnolent but easily arousable.  Reportedly, NIHSS was 0     No acute abnormality on CT head but there is evidence of chronic ischemic white matter changes, arthrosclerosis of the skull base.  No acute abnormality on CTA chest otherwise evidence of cardiomegaly, more prominent mediastinal and right hilar lymph nodes which are  nonspecific and aortic atherosclerosis.   Urinalysis was consistent with UTI.   Assessment and Plan:   Acute complicated UTI, underlying right moderate hydronephrosis, right UPJ stenosis: Completed 3 days of IV ceftriaxone  on 02/02/2024.  She will be discharged on 7-day course of Keflex  to complete 10 days of treatment for complicated UTI.  No growth on blood cultures.  Urine culture was not sent on admission.   Previous urine culture from 01/21/2024 showed E. coli that is pansensitive. She had started Macrodantin  prior to admission but she said she is intolerant of Macrodantin .     Acute metabolic encephalopathy: Resolved. Postural dizziness with presyncope: Resolved Minor neurocognitive impairment: She appears to be at baseline.     Hypokalemia: Replete potassium.  Patient has recurrent hypokalemia so she will be discharged home on low-dose potassium chloride  10 mEq daily.  Outpatient follow-up with PCP recommended.     CAD with coronary stent: Troponins negative.  Continue aspirin , plavix , isosorbide  mononitrate, metoprolol  and rosuvastatin .     Hypertension: Continue antihypertensives    Her condition has improved and she is deemed stable for discharge to home today.  She said she lives at home with her daughters and grandchildren. Plan discussed with Alexa, daughter, at the bedside.       Consultants: None Procedures performed: None  Disposition: Home Diet recommendation:  Discharge Diet Orders (From admission, onward)     Start     Ordered   02/02/24 0000  Diet - low sodium heart healthy        02/02/24 1308  Cardiac diet DISCHARGE MEDICATION: Allergies as of 02/02/2024       Reactions   Fentanyl Nausea And Vomiting   Sodium Pentobarbital [pentobarbital] Anaphylaxis   When pt was sedated for surgery   Hydrochlorothiazide Other (See Comments)   hyponatremia   Ciprofloxacin     Blurry vision, sleepy and make pt sick   Codeine Other (See Comments)    Other reaction(s): UNKNOWN   Penicillin G Rash   Sulfa Antibiotics Rash        Medication List     STOP taking these medications    nitrofurantoin  (macrocrystal-monohydrate) 100 MG capsule Commonly known as: MACROBID        TAKE these medications    amLODipine  5 MG tablet Commonly known as: NORVASC  Take 1 tablet (5 mg total) by mouth daily.   aspirin  EC 81 MG tablet Take 81 mg by mouth daily. Swallow whole.   cephALEXin  500 MG capsule Commonly known as: KEFLEX  Take 1 capsule (500 mg total) by mouth 3 (three) times daily for 7 days. Start taking on: February 03, 2024   Cholecalciferol  50 MCG (2000 UT) Caps Take 1 capsule by mouth daily.   clopidogrel  75 MG tablet Commonly known as: PLAVIX  Take 1 tablet (75 mg total) by mouth daily.   estradiol  0.1 MG/GM vaginal cream Commonly known as: ESTRACE  VAGINAL Apply 0.5mg  (pea-sized amount)  just inside the vaginal introitus with a finger-tip on Monday, Wednesday and Friday nights.   ezetimibe  10 MG tablet Commonly known as: ZETIA  Take 10 mg by mouth at bedtime.   isosorbide  mononitrate 30 MG 24 hr tablet Commonly known as: IMDUR  Take 120 mg by mouth daily.   metoprolol  succinate 100 MG 24 hr tablet Commonly known as: TOPROL -XL Take 1 tablet (100 mg total) by mouth at bedtime.   nitroGLYCERIN  0.4 MG SL tablet Commonly known as: NITROSTAT  Place under the tongue.   potassium chloride  10 MEQ tablet Commonly known as: KLOR-CON  M Take 1 tablet (10 mEq total) by mouth daily for 7 days.   PreserVision AREDS 2 Caps Take 1 capsule by mouth daily.   rosuvastatin  10 MG tablet Commonly known as: CRESTOR  Take 1 tablet (10 mg total) by mouth daily. What changed:  when to take this reasons to take this        Discharge Exam: Filed Weights   01/31/24 1633 01/31/24 2028  Weight: 62.6 kg 59.9 kg   GEN: NAD SKIN: Warm and dry EYES: No pallor or icterus ENT: MMM CV: RRR PULM: CTA B ABD: soft, ND, NT, +BS CNS:  AAO x 3, non focal EXT: Mild chronic b/l leg edema, no tenderness   Condition at discharge: good  The results of significant diagnostics from this hospitalization (including imaging, microbiology, ancillary and laboratory) are listed below for reference.   Imaging Studies: CT Angio Chest Pulmonary Embolism (PE) W or WO Contrast Result Date: 02/01/2024 CLINICAL DATA:  Mental status change possible sepsis EXAM: CT ANGIOGRAPHY CHEST WITH CONTRAST TECHNIQUE: Multidetector CT imaging of the chest was performed using the standard protocol during bolus administration of intravenous contrast. Multiplanar CT image reconstructions and MIPs were obtained to evaluate the vascular anatomy. RADIATION DOSE REDUCTION: This exam was performed according to the departmental dose-optimization program which includes automated exposure control, adjustment of the mA and/or kV according to patient size and/or use of iterative reconstruction technique. CONTRAST:  75mL OMNIPAQUE  IOHEXOL  350 MG/ML SOLN COMPARISON:  Chest x-ray 01/31/2024, chest CT 12/17/2023 FINDINGS: Cardiovascular: Satisfactory opacification of the pulmonary arteries to the segmental  level. No evidence of pulmonary embolism. Advanced aortic atherosclerosis. No aneurysm or dissection. Advanced multilevel coronary vascular calcification. Cardiomegaly. No pericardial effusion. Mediastinum/Nodes: Patent trachea. 3.2 cm left thyroid mass. In the setting of significant comorbidities or limited life expectancy, no follow-up recommended (ref: J Am Coll Radiol. 2015 Feb;12(2): 143-50). Mildly prominent mediastinal lymph nodes. Precarinal lymph node measures up to 12 mm. Small right hilar nodes up to 10 mm. Subcarinal lymph node measuring up to 10 mm. Esophagus is unremarkable. Lungs/Pleura: Mild bilateral subpleural reticulation. Scattered tubal pulmonary nodules, largest is seen in the right upper lobe and measures up to 4 mm, no specific imaging follow-up is recommended.  No acute airspace disease, pleural effusion or pneumothorax Upper Abdomen: Multiple hepatic cysts.  No acute findings. Musculoskeletal: No acute osseous abnormality Review of the MIP images confirms the above findings. IMPRESSION: 1. Negative for acute pulmonary embolus. 2. Cardiomegaly.  No pleural effusion or focal airspace disease. 3. Mildly prominent mediastinal and right hilar lymph nodes, nonspecific. 4. Aortic atherosclerosis. Aortic Atherosclerosis (ICD10-I70.0). Electronically Signed   By: Luke Bun M.D.   On: 02/01/2024 00:57   CT HEAD WO CONTRAST ( ) Result Date: 01/31/2024 EXAM: CT HEAD WITHOUT 01/31/2024 09:14:08 PM TECHNIQUE: CT of the head was performed without the administration of intravenous contrast. Automated exposure control, iterative reconstruction, and/or weight based adjustment of the mA/kV was utilized to reduce the radiation dose to as low as reasonably achievable. COMPARISON: None available. CLINICAL HISTORY: Mental status change, unknown cause. FINDINGS: BRAIN AND VENTRICLES: No acute intracranial hemorrhage. No mass effect or midline shift. No extra-axial fluid collection. Gray-white differentiation is maintained. No hydrocephalus. Chronic ischemic white matter changes. ORBITS: No acute abnormality. SINUSES AND MASTOIDS: No acute abnormality. SOFT TISSUES AND SKULL: No acute skull fracture. No acute soft tissue abnormality. Atherosclerosis at the skull base. IMPRESSION: 1. No acute intracranial abnormality. 2. Chronic ischemic white matter changes and atherosclerosis at the skull base. Electronically signed by: Franky Stanford MD 01/31/2024 09:34 PM EDT RP Workstation: [id number]   DG Chest Port 1 View Result Date: 01/31/2024 CLINICAL DATA:  Questionable sepsis - evaluate for abnormality. EXAM: PORTABLE CHEST 1 VIEW COMPARISON:  12/17/2023. FINDINGS: There are diffuse increased interstitial markings, essentially similar to the prior study. Findings are nonspecific and  differential diagnosis includes pulmonary edema, chronic interstitial lung disease, etc. Bilateral lung fields are otherwise clear. No acute consolidation or lung collapse. Bilateral costophrenic angles are clear. Stable cardio-mediastinal silhouette. No acute osseous abnormalities. The soft tissues are within normal limits. IMPRESSION: *Diffuse increased interstitial markings, essentially similar to the prior study. Findings are nonspecific and differential diagnosis includes pulmonary edema, chronic interstitial lung disease, etc. Electronically Signed   By: Ree Molt M.D.   On: 01/31/2024 17:03    Microbiology: Results for orders placed or performed during the hospital encounter of 01/31/24  Blood Culture (routine x 2)     Status: None (Preliminary result)   Collection Time: 01/31/24  4:53 PM   Specimen: BLOOD RIGHT ARM  Result Value Ref Range Status   Specimen Description   Final    BLOOD RIGHT ARM Performed at [facility], [address], [place], [place] [address]    Special Requests   Final    BOTTLES DRAWN AEROBIC AND ANAEROBIC Blood Culture results may not be optimal due to an inadequate volume of blood received in culture bottles   Culture   Final    NO GROWTH 2 DAYS Performed at [facility], [address] [place], [place] [address]    Report Status PENDING  Incomplete  Blood Culture (routine x 2)     Status: None (Preliminary result)   Collection Time: 01/31/24  4:53 PM   Specimen: BLOOD LEFT ARM  Result Value Ref Range Status   Specimen Description   Final    BLOOD LEFT ARM Performed at [facility], [address] [place], [place] [address]    Special Requests   Final    BOTTLES DRAWN AEROBIC AND ANAEROBIC Blood Culture adequate volume   Culture   Final    NO GROWTH 2 DAYS Performed at [facility], [address] [place], [place] [address]    Report Status PENDING  Incomplete    Labs: CBC: Recent Labs  Lab 01/31/24 1653  02/01/24 0536  WBC 10.8* 9.0  NEUTROABS 9.6*  --   HGB 12.8 11.0*  HCT 40.6 33.6*  MCV 96.0 93.3  PLT 247 196   Basic Metabolic Panel: Recent Labs  Lab 01/31/24 1653 02/01/24 0536 02/02/24 0834  NA 136 136 140  K 3.9 3.2* 3.3*  CL 99 102 103  CO2 27 27 29   GLUCOSE 155* 114* 107*  BUN 29* 16 15  CREATININE 0.58 0.33* 0.46  CALCIUM  9.5 8.5* 8.9  MG  --  1.9  --   PHOS  --  2.9  --    Liver Function Tests: Recent Labs  Lab 01/31/24 1653  AST 23  ALT 16  ALKPHOS 69  BILITOT 0.2  PROT 7.3  ALBUMIN 3.9   CBG: Recent Labs  Lab 01/31/24 2050  GLUCAP 135*    Discharge time spent: greater than 30 minutes.  Signed: AIDA CHO, MD [facility] 02/02/2024

## 2024-02-02 NOTE — Plan of Care (Signed)

## 2024-02-04 ENCOUNTER — Ambulatory Visit: Admitting: Obstetrics & Gynecology

## 2024-02-04 VITALS — BP 164/82 | HR 95 | Wt 131.0 lb

## 2024-02-04 DIAGNOSIS — N812 Incomplete uterovaginal prolapse: Secondary | ICD-10-CM

## 2024-02-04 DIAGNOSIS — Z4689 Encounter for fitting and adjustment of other specified devices: Secondary | ICD-10-CM

## 2024-02-04 NOTE — Progress Notes (Signed)
    GYNECOLOGY PROGRESS NOTE  Subjective:    Patient ID: Francisca Langenderfer, female    DOB: 04-Mar-1924, 89 y.o.   MRN: [id number]  HPI  Patient is a 89 y.o. [id number] here with her oldest daughter, as a new patient, to discuss her pessary. She started using a pessary about 30 years ago. She changed to a different/bigger pessary about 6 years ago. She generally removes and cleans it every 2-4 weeks. However, she was started on plavix  recently and has noted some bleeding when she removes her pessary. She has urinary incontinence and has a urologist. She was recently prescribed vaginal estrogen 3 nights per week.  The following portions of the patient's history were reviewed and updated as appropriate: allergies, current medications, past family history, past medical history, past social history, past surgical history, and problem list.  Review of Systems Pertinent items are noted in HPI.   Objective:   Blood pressure (!) 164/82, pulse 95, weight 131 lb (59.4 kg). Body mass index is 25.58 kg/m. Well nourished, well hydrated White female, no apparent distress She is ambulating with a walker and conversing normally, quite spry. EG- open comedones, mild atrophy Speculum exam reveals redundant vaginal mucosa, no excoriations, grade 2 prolpase Bimanual exam reveals no masses or tenderness  I fitted her with a #6 ring with support (Her previous torn and old pessary was a #7 with support). She reports that the #6 does not cause discomfort and relieves her prolapse.   Assessment:   Grade 2 uterine prolapse  Plan:   Continue q2-4 week pessary cleaning and estrogen 2-3 nights per week Come back 1 year or prn sooner I reminded her and her daughter that if a pessary is forgotten then it can erode into the bowel/bladder.

## 2024-02-05 LAB — CULTURE, BLOOD (ROUTINE X 2)
Culture: NO GROWTH
Culture: NO GROWTH
Special Requests: ADEQUATE

## 2024-02-07 ENCOUNTER — Telehealth

## 2024-03-05 ENCOUNTER — Ambulatory Visit

## 2024-03-05 VITALS — BP 152/78 | HR 74 | Ht 60.0 in | Wt 134.0 lb

## 2024-03-05 DIAGNOSIS — R54 Age-related physical debility: Secondary | ICD-10-CM

## 2024-03-05 DIAGNOSIS — I214 Non-ST elevation (NSTEMI) myocardial infarction: Secondary | ICD-10-CM | POA: Diagnosis not present

## 2024-03-05 DIAGNOSIS — Z85038 Personal history of other malignant neoplasm of large intestine: Secondary | ICD-10-CM | POA: Insufficient documentation

## 2024-03-05 DIAGNOSIS — I25118 Atherosclerotic heart disease of native coronary artery with other forms of angina pectoris: Secondary | ICD-10-CM | POA: Diagnosis not present

## 2024-03-05 DIAGNOSIS — I10 Essential (primary) hypertension: Secondary | ICD-10-CM

## 2024-03-05 NOTE — Progress Notes (Signed)
 **   New Patient Visit   Physician: Abbygail [place]hoite A Heavenly Christine, MD  Patient: Alexis Perez   DOB: 12/20/1923   89 y.o. Female  MRN: [id number] Visit Date: 03/05/2024   Chief Complaint  Patient presents with   Establish Care   Neck Pain    Right side, back of neck, radiating into shoulder; reports decreased ROM; denies injury   Subjective  Alexis Perez is a 89 y.o. female who presents today as a new patient to establish care.   HPI  Discussed the use of AI scribe software for clinical note transcription with the patient, who gave verbal consent to proceed.  History of Present Illness   Alexis Perez is a 89 year old female with a history of myocardial infarction who presents with neck pain and medication concerns.      89 y.o. female with a past medical history of coronary disease, clear RCA, prior stenting in 2023 to the left circumflex, severe residual mid LAD disease , with NSTEMI in June 2025.  ECHO 12/2023 with LVEF estimation, is 60 to 65%  Remains on ASA, clopidogrel , statin once weekly and ezetemibe.  Cardiology f/u ongoing.   No CP or SOB.  Did have episode of lightheadedness/pre-syncope yesterday.    Recent urosepsis with UVJ stenosis and moderate right hydronephrosis at same admission in June.   Remains on estarce.   Presyncope - Experienced episode of lightheadedness and dizziness the previous day, described as feeling like she was going to pass out - Symptoms resolved after approximately 20 minutes - Home blood pressure readings stable, with lowest recorded at 120 mmHg  Adverse drug reactions - History of adverse reaction to Macrobid , resulting in confusion and respiratory distress  Electrolyte imbalance - History of hypokalemia - finished 7 day course of potassium more recently.   Peripheral edema, chronic - Leg swelling, more pronounced in one leg due to prior ankle fracture - Difficulty using compression stockings   History of colon polyps, last colonoscopy age  74  Lives with three daughters, walks with a walker occasionally, still driving.   Diet healthy, walks twice daily.  Weight stable.  Will receive influenza vaccine.   ASSESSMENT & PLAN  Encounter Diagnoses  Name Primary?   Non-ST elevation (NSTEMI) myocardial infarction [facility]) Yes   Coronary artery disease of native artery of native heart with stable angina pectoris (HCC)     Orders Placed This Encounter  Procedures   CBC with Differential/Platelet   Comprehensive metabolic panel with GFR   Hemoglobin A1c    Assessment and Plan    Atherosclerotic heart disease with history of non-ST elevation myocardial infarction and angina Non-ST elevation myocardial infarction, recent -  Currently on aspirin  and clopidogrel  as per cardiology recommendation. Discussed bleeding risk associated with dual antiplatelet therapy, especially given her age.  No recent chest pain reported. - Continue aspirin  and clopidogrel , statin and ezetimibeas per cardiology recommendation - Check blood work to assess overall health status  - Monitor BP at home which seems to be stable.   Chronic venous insufficiency with leg edema and skin changes Chronic leg edema with associated skin changes. Previous ankle fracture contributing to asymmetry in leg size. Discussed slow healing process due to age and venous stasis. Compression stockings have been used but are difficult to manage. - Recommend use of compression stockings or ace bandage wrapping - Elevate legs when sitting to reduce swelling - Xeroform and occlusive dressing will aid in healing of skin changes  Hypertension Blood pressure well-managed  at home with no episodes of hypotension. Currently on amlodipine  5 mg and metoprolol . 100 mg XR Recent episode of lightheadedness and dizziness, possibly related to blood pressure or other factors. Discussed importance of monitoring blood pressure at home and checking blood work to assess for any underlying issues. -  Monitor blood pressure at home  - Interventions somewhat limited due to age and patient preferences going forward  History of urosepsis  - Remain on Estrace  cream  General Health Maintenance Flu vaccine not yet received for the current year. Discussed importance of flu vaccination. - Administer flu vaccine  Follow-Up Plan to follow up on lab results and overall health status. - Schedule follow-up appointment in a few weeks       Objective  BP (!) 152/78   Pulse 74   Ht 5' (1.524 m)   Wt 134 lb (60.8 kg)   SpO2 96%   BMI 26.17 kg/m      Review of Systems  Constitutional:  Negative for chills, fever and weight loss.  Eyes:  Negative for blurred vision. h Respiratory:  Negative for cough and shortness of breath.   Cardiovascular:  Negative for chest pain and palpitations.  Skin:  Negative for rash.  Psychiatric/Behavioral:  Negative for depression. The patient is not nervous/anxious.      Physical Exam Physical Exam Vitals reviewed.  Constitutional:      Appearance: Normal appearance. Well-developed with normal weight.  HENT:     Head: Normocephalic and atraumatic.  Normal mucous membranes, no oral lesions Eyes:     Pupils: Pupils are equal, round, and reactive to light.  Neck:     Thyroid: No thyroid mass or thyromegaly.  Cardiovascular:     Rate and Rhythm: Normal rate and regular rhythm. Normal heart sounds. Normal peripheral pulses Pulmonary:     Normal breath sounds with normal effort Abdominal:   Abdomen is soft, without tenderness or noted hepatosplenomegaly Musculoskeletal:        General: No swelling or edema  Lymphadenopathy:     Cervical: No cervical adenopathy.  Skin:    General: Skin is warm and dry without noticeable rash. Neurological:     General: No focal deficit present.  Psychiatric:        Mood and Affect: Mood, behavior and cognition normal   Past Medical History:  Diagnosis Date   Allergy    Arthritis    Cancer (HCC) cells only    Cataract    Complicated urinary tract infection 01/31/2024   Heart murmur    Hypertension    Myocardial infarction [facility])    Osteoporosis    Pyuria 10/03/2021   Past Surgical History:  Procedure Laterality Date   ANKLE FRACTURE SURGERY Left 1999   COLON SURGERY  polyps   FRACTURE SURGERY     SMALL INTESTINE SURGERY  polyps   Family Status  Relation Name Status   Mother  Deceased   Father  Deceased  No partnership data on file   History reviewed. No pertinent family history. Social History   Socioeconomic History   Marital status: Widowed    Spouse name: Not on file   Number of children: Not on file   Years of education: Not on file   Highest education level: Bachelor's degree (e.g., BA, AB, BS)  Occupational History   Not on file  Tobacco Use   Smoking status: Former    Current packs/day: 0.00    Average packs/day: 0.5 packs/day for 10.0 years (5.0 ttl pk-yrs)  Types: Cigarettes    Quit date: 09/14/1955    Years since quitting: 68.5   Smokeless tobacco: Never  Vaping Use   Vaping status: Never Used  Substance and Sexual Activity   Alcohol  use: Never   Drug use: Never   Sexual activity: Not Currently  Other Topics Concern   Not on file  Social History Narrative   Not on file   Social Drivers of Health   Financial Resource Strain: Low Risk  (03/04/2024)   Overall Financial Resource Strain (CARDIA)    Difficulty of Paying Living Expenses: Not hard at all  Food Insecurity: No Food Insecurity (03/04/2024)   Hunger Vital Sign    Worried About Running Out of Food in the Last Year: Never true    Ran Out of Food in the Last Year: Never true  Transportation Needs: No Transportation Needs (03/04/2024)   PRAPARE - Administrator, Civil Service (Medical): No    Lack of Transportation (Non-Medical): No  Physical Activity: Insufficiently Active (03/04/2024)   Exercise Vital Sign    Days of Exercise per Week: 2 days    Minutes of Exercise per Session: 20 min   Stress: No Stress Concern Present (03/04/2024)   [employer] of Occupational Health - Occupational Stress Questionnaire    Feeling of Stress: Not at all  Social Connections: Moderately Isolated (03/04/2024)   Social Connection and Isolation Panel    Frequency of Communication with Friends and Family: More than three times a week    Frequency of Social Gatherings with Friends and Family: More than three times a week    Attends Religious Services: More than 4 times per year    Active Member of [employer] or Organizations: No    Attends Banker Meetings: Not on file    Marital Status: Widowed   Outpatient Medications Prior to Visit  Medication Sig   amLODipine  (NORVASC ) 5 MG tablet Take 1 tablet (5 mg total) by mouth daily.   aspirin  EC 81 MG tablet Take 81 mg by mouth daily. Swallow whole.   Cholecalciferol  50 MCG (2000 UT) CAPS Take 1 capsule by mouth daily.   clopidogrel  (PLAVIX ) 75 MG tablet Take 1 tablet (75 mg total) by mouth daily.   estradiol  (ESTRACE  VAGINAL) 0.1 MG/GM vaginal cream Apply 0.5mg  (pea-sized amount)  just inside the vaginal introitus with a finger-tip on Monday, Wednesday and Friday nights.   ezetimibe  (ZETIA ) 10 MG tablet Take 10 mg by mouth at bedtime.   isosorbide  mononitrate (IMDUR ) 30 MG 24 hr tablet Take 120 mg by mouth daily.   metoprolol  succinate (TOPROL -XL) 100 MG 24 hr tablet Take 1 tablet (100 mg total) by mouth at bedtime.   Multiple Vitamins-Minerals (PRESERVISION AREDS 2) CAPS Take 1 capsule by mouth daily.   nitroGLYCERIN  (NITROSTAT ) 0.4 MG SL tablet Place under the tongue.   rosuvastatin  (CRESTOR ) 10 MG tablet Take 1 tablet (10 mg total) by mouth daily. (Patient taking differently: Take 10 mg by mouth once a week.)   triamcinolone ointment (KENALOG) 0.1 % Apply 1 Application topically 2 (two) times daily.   [DISCONTINUED] potassium chloride  (KLOR-CON  M) 10 MEQ tablet Take 1 tablet (10 mEq total) by mouth daily for 7 days.   No  facility-administered medications prior to visit.   Allergies  Allergen Reactions   Fentanyl Nausea And Vomiting   Sodium Pentobarbital [Pentobarbital] Anaphylaxis    When pt was sedated for surgery   Hydrochlorothiazide Other (See Comments)    hyponatremia  Ciprofloxacin      Blurry vision, sleepy and make pt sick   Codeine Other (See Comments)    Other reaction(s): UNKNOWN   Macrobid  [Nitrofurantoin ] Nausea And Vomiting   Penicillin G Rash   Sulfa Antibiotics Rash    Immunization History  Administered Date(s) Administered    sv, Bivalent, Protein Subunit Rsvpref,pf (Abrysvo) 06/05/2022   Fluad Quad(high Dose 65+) 04/18/2022   INFLUENZA, HIGH DOSE SEASONAL PF 04/03/2017, 03/10/2018, 04/22/2023   Influenza Inj Mdck Quad Pf 03/18/2020   Influenza, Seasonal, Injecte, Preservative Fre 05/04/2006, 04/06/2008, 04/01/2012   Influenza,inj,Quad PF,6+ Mos 04/24/2013, 03/19/2019   Influenza-Unspecified 03/10/2018, 04/27/2021   PFIZER Comirnaty(Gray Top)Covid-19 Tri-Sucrose Vaccine 10/04/2020   PFIZER(Purple Top)SARS-COV-2 Vaccination 07/22/2019, 08/13/2019, 03/31/2020, 04/22/2023   Pfizer(Comirnaty)Fall Seasonal Vaccine 12 years and older 03/23/2022   Pneumococcal Conjugate-13 02/16/2014   Pneumococcal Polysaccharide-23 02/04/1994   Tdap 12/21/2011, 10/13/2017   Zoster, Live 02/04/2010    Health Maintenance  Topic Date Due   Zoster Vaccines- Shingrix (1 of 2) 12/09/1975   DEXA SCAN  Never done   Medicare Annual Wellness (AWV)  10/12/2023   INFLUENZA VACCINE  01/31/2024   COVID-19 Vaccine (7 - 2025-26 season) 03/02/2024   DTaP/Tdap/Td (3 - Td or Tdap) 10/14/2027   Pneumococcal Vaccine: 50+ Years  Completed   HPV VACCINES  Aged Out   Meningococcal B Vaccine  Aged Out    Patient Care Team: Everlene Parris LABOR, MD as PCP - General (Family Medicine)  Depression Screen    03/05/2024   11:35 AM 09/11/2021    4:33 PM  PHQ 2/9 Scores  PHQ - 2 Score 0 0  PHQ- 9 Score 0 2      Herschel Fleagle LABOR Everlene, MD  [facility] [phone] (phone) [phone] (fax)  [facility]*/*/*/////////////////////////////

## 2024-03-06 LAB — CBC WITH DIFFERENTIAL/PLATELET
Absolute Lymphocytes: 1692 {cells}/uL (ref 850–3900)
Absolute Monocytes: 648 {cells}/uL (ref 200–950)
Basophils Absolute: 50 {cells}/uL (ref 0–200)
Basophils Relative: 0.7 %
Eosinophils Absolute: 259 {cells}/uL (ref 15–500)
Eosinophils Relative: 3.6 %
HCT: 38.9 % (ref 35.0–45.0)
Hemoglobin: 12.4 g/dL (ref 11.7–15.5)
MCH: 30.5 pg (ref 27.0–33.0)
MCHC: 31.9 g/dL — ABNORMAL LOW (ref 32.0–36.0)
MCV: 95.8 fL (ref 80.0–100.0)
MPV: 10.9 fL (ref 7.5–12.5)
Monocytes Relative: 9 %
Neutro Abs: 4550 {cells}/uL (ref 1500–7800)
Neutrophils Relative %: 63.2 %
Platelets: 231 Thousand/uL (ref 140–400)
RBC: 4.06 Million/uL (ref 3.80–5.10)
RDW: 13 % (ref 11.0–15.0)
Total Lymphocyte: 23.5 %
WBC: 7.2 Thousand/uL (ref 3.8–10.8)

## 2024-03-06 LAB — HEMOGLOBIN A1C
Hgb A1c MFr Bld: 5.7 % — ABNORMAL HIGH (ref <5.7)
Mean Plasma Glucose: 117 mg/dL
eAG (mmol/L): 6.5 mmol/L

## 2024-03-06 LAB — COMPREHENSIVE METABOLIC PANEL WITH GFR
AG Ratio: 1.4 (calc) (ref 1.0–2.5)
ALT: 13 U/L (ref 6–29)
AST: 17 U/L (ref 10–35)
Albumin: 4 g/dL (ref 3.6–5.1)
Alkaline phosphatase (APISO): 58 U/L (ref 37–153)
BUN: 24 mg/dL (ref 7–25)
CO2: 31 mmol/L (ref 20–32)
Calcium: 9.7 mg/dL (ref 8.6–10.4)
Chloride: 100 mmol/L (ref 98–110)
Creat: 0.64 mg/dL (ref 0.60–0.95)
Globulin: 2.8 g/dL (ref 1.9–3.7)
Glucose, Bld: 108 mg/dL — ABNORMAL HIGH (ref 65–99)
Potassium: 4.2 mmol/L (ref 3.5–5.3)
Sodium: 139 mmol/L (ref 135–146)
Total Bilirubin: 0.5 mg/dL (ref 0.2–1.2)
Total Protein: 6.8 g/dL (ref 6.1–8.1)
eGFR: 80 mL/min/1.73m2 (ref >=60)

## 2024-03-09 NOTE — Progress Notes (Signed)
 Subjective:    Chief Complaint  Patient presents with  . Neck Pain    X 2 months Pt states she's had neck pain for 2 months and it's getting worse States she applies heat as well as Voltaren  gel w/ little relief States the pain is in her right shoulder blade and radiates to top of neck, describes it as sharp on exertion w/ 7/10 rating Pt denies HA, visual disturbances    History was provided by the patient and EMR. Alexis Perez is a 89 y.o., female  who presents with 25-month history of posterior neck pain, right greater than left.  Reports was in the hospital not long ago, and reclining bed, neck felt comfortable in that position.  Otherwise has been bothering her the entire 2 months.  No other attempts to treat prior to arrival.  Denies prior history of neck issues.  Recalls no specific injury or inciting event, remote or recent.  No complaints of headache or dizziness; no chest pain or shortness of breath; no nausea, vomiting, diarrhea; no fevers or chills. Symptoms include: above   Patient denies: above Treatment to date: above   Past Medical History:  Diagnosis Date  . Coronary artery disease 07/14/2021   Added automatically from request for surgery [id number]    . Essential hypertension 10/03/2021  . Hyperlipidemia 06/24/2021  . Osteoarthrosis, generalized, involving multiple sites 12/19/2010   The following portions of the patient's history were reviewed and updated as appropriate: allergies, current medications, past social history, and problem list.  Review of Systems A complete review of systems was performed.  Positive and pertinent negative responses are documented in the HPI, and all other systems are negative.   Objective:     Vitals:   03/09/24 1131  BP: (!) 160/92  Pulse: 70  Temp: 36.7 C (98.1 F)  TempSrc: Oral  SpO2: 97%  Weight: 60.8 kg (134 lb)  Height: 152.4 cm (5')  PainSc:   7  PainLoc: Neck    General Appearance: Elderly.  Kyphoscoliosis.   Pleasant and cooperative.  No acute distress.  Ambulates with walker. HEENT:  [place]/AT, PERRLA, EOMI, sclera clear, conjunctivae pink.   Neck:  Supple, no JVD or adenopathy.  Mildly tender right paracervical.  Reduced range of motion in all planes. Cardiovascular:  Regular rate and rhythm with 3/6 to 4/6 systolic murmur. Lungs:  Clear to auscultation with fair effort. Abdomen: Benign. Extremities:   No cyanosis, clubbing, or edema. Neuro:  Alert and orient x4; nonfocal with best test.    Lab/X-ray/Treatments:   Cervical-marked degenerative changes, osteopenia       Assessment:      1. Cervicalgia -     X-ray cervical spine 2 to 3 views; Future -     predniSONE (DELTASONE) 10 MG tablet; Take 1 tablet (10 mg total) by mouth 2 (two) times daily for 7 days  Dispense: 14 tablet; Refill: 0 -     tiZANidine (ZANAFLEX) 2 MG tablet; Take 1 tablet (2 mg total) by mouth 3 (three) times daily as needed for Muscle spasms for up to 20 doses  Dispense: 20 tablet; Refill: 0   Low-dose prednisone, low-dose Zanaflex, moist heat and rest.  May call for physiatry referral if needed.  Call return if any further problems.    Plan:   Requested Prescriptions   Signed Prescriptions Disp Refills  . predniSONE (DELTASONE) 10 MG tablet 14 tablet 0    Sig: Take 1 tablet (10 mg total) by mouth 2 (  two) times daily for 7 days  . tiZANidine (ZANAFLEX) 2 MG tablet 20 tablet 0    Sig: Take 1 tablet (2 mg total) by mouth 3 (three) times daily as needed for Muscle spasms for up to 20 doses

## 2024-03-11 NOTE — Result Encounter Note (Signed)
 Continue same instruction.

## 2024-03-27 ENCOUNTER — Ambulatory Visit

## 2024-04-01 ENCOUNTER — Ambulatory Visit

## 2024-04-01 VITALS — BP 158/81 | HR 95 | Ht 60.0 in | Wt 135.0 lb

## 2024-04-01 DIAGNOSIS — Z23 Encounter for immunization: Secondary | ICD-10-CM

## 2024-04-01 DIAGNOSIS — I252 Old myocardial infarction: Secondary | ICD-10-CM | POA: Diagnosis not present

## 2024-04-01 DIAGNOSIS — E079 Disorder of thyroid, unspecified: Secondary | ICD-10-CM | POA: Insufficient documentation

## 2024-04-01 DIAGNOSIS — I25118 Atherosclerotic heart disease of native coronary artery with other forms of angina pectoris: Secondary | ICD-10-CM

## 2024-04-01 DIAGNOSIS — I10 Essential (primary) hypertension: Secondary | ICD-10-CM

## 2024-04-01 NOTE — Progress Notes (Signed)
 Progress Note  Physician: Jibreel Fedewa A Navin Dogan, MD   HPI: Alexis Perez is a 89 y.o. female presenting on 04/01/2024 for Follow-up .  Discussed the use of AI scribe software for clinical note transcription with the patient, who gave verbal consent to proceed.  History of Present Illness   Alexis Perez is a 89 year old female who presents for a routine follow-up visit.  Patient seen in follow-up.  She was seen by internal medicine with complaints of neck pain.  Subsequent imaging ordered.  Symptoms stable today.  Patient with significant coronary artery disease and recent NSTEMI in June 2025.  She continues on Zetia , rosuvastatin  10, metoprolol  100 mg daily and nitroglycerin  for as needed use. Patient is also on amlodipine  5 mg and Imdur  120 mg daily.  She reports at home that her blood pressures in the 130s to 140s systolic with normal diastolic pressures.  Occasional dizziness when getting out of bed.    Baseline peripheral edema 2+ bilateral lower extremities without changes.  She has no chest pain or shortness of breath.  She walked to the office which is a little over a mile without difficulty.  She does walk with a walker  Labs reviewed which show overall stable situation.  She has overall normal renal function.  Slight hemoglobin A1c elevation which is nonconcerning given her age  Urinary tract symptoms - History of urinary tract infections - No current symptoms of urinary tract infection - Upcoming urology appointment in October - On estrogen therapy  General constitutional and nutritional status - Good appetite - No issues with weight maintenance - No shortness of breath - No dysphagia - No issues with energy levels        Recent Results (from the past 2160 hours)  Urinalysis, Complete     Status: Abnormal   Collection Time: 01/21/24  2:42 PM  Result Value Ref Range   Specific Gravity, UA 1.025 1.005 - 1.030   pH, UA 6.0 5.0 - 7.5   Color, UA Yellow  Yellow   Appearance Ur Cloudy (A) Clear   Leukocytes,UA 2+ (A) Negative   Protein,UA 2+ (A) Negative/Trace   Glucose, UA Negative Negative   Ketones, UA Negative Negative   RBC, UA 2+ (A) Negative   Bilirubin, UA Negative Negative   Urobilinogen, Ur 0.2 0.2 - 1.0 mg/dL   Nitrite, UA Positive (A) Negative   Microscopic Examination See below:     Comment: Microscopic was indicated and was performed.  Microscopic Examination     Status: Abnormal   Collection Time: 01/21/24  2:42 PM   Urine  Result Value Ref Range   WBC, UA >30 (A) 0 - 5 /hpf   RBC, Urine 11-30 (A) 0 - 2 /hpf   Epithelial Cells (non renal) 0-10 0 - 10 /hpf   Bacteria, UA Many (A) None seen/Few  CULTURE, URINE COMPREHENSIVE     Status: Abnormal   Collection Time: 01/21/24  3:39 PM   Specimen: Urine   UR  Result Value Ref Range   Urine Culture, Comprehensive Final report (A)    Organism ID, Bacteria Escherichia coli (A)     Comment: Cefazolin  with an MIC <=16 predicts susceptibility to the oral agents cefaclor, cefdinir, cefpodoxime, cefprozil, cefuroxime, cephalexin , and loracarbef when used for therapy of uncomplicated urinary tract infections due to E. coli, Klebsiella pneumoniae, and Proteus mirabilis. Greater than 100,000 colony forming units per mL    ANTIMICROBIAL  SUSCEPTIBILITY Comment     Comment:       ** S = Susceptible; I = Intermediate; R = Resistant **                    P = Positive; N = Negative             MICS are expressed in micrograms per mL    Antibiotic                 RSLT#1    RSLT#2    RSLT#3    RSLT#4 Amoxicillin/Clavulanic Acid    S Ampicillin                     S Cefazolin                       S Cefepime                       S Cefoxitin                      S Cefpodoxime                    S Ceftriaxone                     S Ciprofloxacin                   S Ertapenem                      S Gentamicin                     S Levofloxacin                   S Meropenem                       S Nitrofurantoin                  S Piperacillin/Tazobactam        S Tetracycline                   S Tobramycin                     S Trimethoprim/Sulfa             S   Lactic acid, plasma     Status: None   Collection Time: 01/31/24  4:53 PM  Result Value Ref Range   Lactic Acid, Venous 1.4 0.5 - 1.9 mmol/L    Comment: Performed at [facility], [address] [place], [place] [address]  Comprehensive metabolic panel     Status: Abnormal   Collection Time: 01/31/24  4:53 PM  Result Value Ref Range   Sodium 136 135 - 145 mmol/L   Potassium 3.9 3.5 - 5.1 mmol/L   Chloride 99 98 - 111 mmol/L   CO2 27 22 - 32 mmol/L   Glucose, Bld 155 (H) 70 - 99 mg/dL    Comment: Glucose reference range applies only to samples taken after fasting for at least 8 hours.   BUN 29 (H) 8 - 23 mg/dL   Creatinine, Ser 9.41 0.44 - 1.00 mg/dL   Calcium  9.5 8.9 - 10.3 mg/dL   Total Protein 7.3 6.5 - 8.1  g/dL   Albumin 3.9 3.5 - 5.0 g/dL   AST 23 15 - 41 U/L   ALT 16 0 - 44 U/L   Alkaline Phosphatase 69 38 - 126 U/L   Total Bilirubin 0.2 0.0 - 1.2 mg/dL   GFR, Estimated >39 >39 mL/min    Comment: (NOTE) Calculated using the CKD-EPI Creatinine Equation (2021)    Anion gap 10 5 - 15    Comment: Performed at [facility], [address] [place], [place] [address]  CBC with Differential     Status: Abnormal   Collection Time: 01/31/24  4:53 PM  Result Value Ref Range   WBC 10.8 (H) 4.0 - 10.5 K/uL   RBC 4.23 3.87 - 5.11 MIL/uL   Hemoglobin 12.8 12.0 - 15.0 g/dL   HCT 59.3 63.9 - 53.9 %   MCV 96.0 80.0 - 100.0 fL   MCH 30.3 26.0 - 34.0 pg   MCHC 31.5 30.0 - 36.0 g/dL   RDW 85.6 88.4 - 84.4 %   Platelets 247 150 - 400 K/uL   nRBC 0.0 0.0 - 0.2 %   Neutrophils Relative % 88 %   Neutro Abs 9.6 (H) 1.7 - 7.7 K/uL   Lymphocytes Relative 5 %   Lymphs Abs 0.5 (L) 0.7 - 4.0 K/uL   Monocytes Relative 4 %   Monocytes Absolute 0.4 0.1 - 1.0 K/uL   Eosinophils Relative 1 %    Eosinophils Absolute 0.1 0.0 - 0.5 K/uL   Basophils Relative 1 %   Basophils Absolute 0.1 0.0 - 0.1 K/uL   Immature Granulocytes 1 %   Abs Immature Granulocytes 0.05 0.00 - 0.07 K/uL    Comment: Performed at [facility], [address] [place], [place] [address]  Protime-INR     Status: None   Collection Time: 01/31/24  4:53 PM  Result Value Ref Range   Prothrombin Time 13.4 11.4 - 15.2 seconds   INR 1.0 0.8 - 1.2    Comment: (NOTE) INR goal varies based on device and disease states. Performed at [facility], [address] [place], [place] [address]   Blood Culture (routine x 2)     Status: None   Collection Time: 01/31/24  4:53 PM   Specimen: BLOOD RIGHT ARM  Result Value Ref Range   Specimen Description      BLOOD RIGHT ARM Performed at [facility], [address], [place], [place] [address]    Special Requests      BOTTLES DRAWN AEROBIC AND ANAEROBIC Blood Culture results may not be optimal due to an inadequate volume of blood received in culture bottles   Culture      NO GROWTH 5 DAYS Performed at [facility], [address] [place], [place] [address]    Report Status 02/05/2024 FINAL   Blood Culture (routine x 2)     Status: None   Collection Time: 01/31/24  4:53 PM   Specimen: BLOOD LEFT ARM  Result Value Ref Range   Specimen Description      BLOOD LEFT ARM Performed at [facility], [address] [place], [place] [address]    Special Requests      BOTTLES DRAWN AEROBIC AND ANAEROBIC Blood Culture adequate volume   Culture      NO GROWTH 5 DAYS Performed at [facility], [address] [place], [place] [address]    Report Status 02/05/2024 FINAL   Urinalysis, w/ Reflex to Culture (  Infection Suspected) -Urine, Clean Catch     Status: Abnormal   Collection Time: 01/31/24  4:53 PM  Result Value Ref Range   Specimen Source URINE, CLEAN CATCH    Color, Urine YELLOW (A) YELLOW   APPearance HAZY (A) CLEAR    Specific Gravity, Urine 1.012 1.005 - 1.030   pH 6.0 5.0 - 8.0   Glucose, UA 50 (A) NEGATIVE mg/dL   Hgb urine dipstick NEGATIVE NEGATIVE   Bilirubin Urine NEGATIVE NEGATIVE   Ketones, ur NEGATIVE NEGATIVE mg/dL   Protein, ur NEGATIVE NEGATIVE mg/dL   Nitrite NEGATIVE NEGATIVE   Leukocytes,Ua MODERATE (A) NEGATIVE   RBC / HPF 0-5 0 - 5 RBC/hpf   WBC, UA 21-50 0 - 5 WBC/hpf    Comment:        Reflex urine culture not performed if WBC <=10, OR if Squamous epithelial cells >5. If Squamous epithelial cells >5 suggest recollection.    Bacteria, UA RARE (A) NONE SEEN   Squamous Epithelial / HPF 11-20 0 - 5 /HPF    Comment: Performed at [facility], [address] [place], [place] [address]  Troponin I (High Sensitivity)     Status: None   Collection Time: 01/31/24  4:53 PM  Result Value Ref Range   Troponin I (High Sensitivity) 8 <18 ng/L    Comment: (NOTE) Elevated high sensitivity troponin I (hsTnI) values and significant  changes across serial measurements may suggest ACS but many other  chronic and acute conditions are known to elevate hsTnI results.  Refer to the Links section for chest pain algorithms and additional  guidance. Performed at [facility], [address] [place], [place] [address]   Troponin I (High Sensitivity)     Status: None   Collection Time: 01/31/24  6:48 PM  Result Value Ref Range   Troponin I (High Sensitivity) 8 <18 ng/L    Comment: (NOTE) Elevated high sensitivity troponin I (hsTnI) values and significant  changes across serial measurements may suggest ACS but many other  chronic and acute conditions are known to elevate hsTnI results.  Refer to the Links section for chest pain algorithms and additional  guidance. Performed at [facility], [address] [place], [place] [address]   Glucose, capillary     Status: Abnormal   Collection Time: 01/31/24  8:50 PM  Result Value Ref Range   Glucose-Capillary  135 (H) 70 - 99 mg/dL    Comment: Glucose reference range applies only to samples taken after fasting for at least 8 hours.  Troponin I (High Sensitivity)     Status: None   Collection Time: 01/31/24  9:03 PM  Result Value Ref Range   Troponin I (High Sensitivity) 10 <18 ng/L    Comment: (NOTE) Elevated high sensitivity troponin I (hsTnI) values and significant  changes across serial measurements may suggest ACS but many other  chronic and acute conditions are known to elevate hsTnI results.  Refer to the Links section for chest pain algorithms and additional  guidance. Performed at [facility], [address] [place], [place] [address]   D-dimer, quantitative     Status: Abnormal   Collection Time: 01/31/24  9:03 PM  Result Value Ref Range   D-Dimer, Quant 0.53 (H) 0.00 - 0.50 ug/mL-FEU    Comment: (NOTE) At the manufacturer cut-off value of 0.5 g/mL FEU, this assay has a negative predictive value of 95-100%.This assay is intended for use in conjunction with a clinical pretest  probability (PTP) assessment model to exclude pulmonary embolism (PE) and deep venous thrombosis (DVT) in outpatients suspected of PE or DVT. Results should be correlated with clinical presentation. Performed at [facility], [address] [place], [place] [address]   Troponin I (High Sensitivity)     Status: Abnormal   Collection Time: 01/31/24 10:41 PM  Result Value Ref Range   Troponin I (High Sensitivity) 27 (H) <18 ng/L    Comment: (NOTE) Elevated high sensitivity troponin I (hsTnI) values and significant  changes across serial measurements may suggest ACS but many other  chronic and acute conditions are known to elevate hsTnI results.  Refer to the Links section for chest pain algorithms and additional  guidance. Performed at [facility], [address] [place], [place] [address]   Basic metabolic panel     Status: Abnormal   Collection Time: 02/01/24   5:36 AM  Result Value Ref Range   Sodium 136 135 - 145 mmol/L   Potassium 3.2 (L) 3.5 - 5.1 mmol/L   Chloride 102 98 - 111 mmol/L   CO2 27 22 - 32 mmol/L   Glucose, Bld 114 (H) 70 - 99 mg/dL    Comment: Glucose reference range applies only to samples taken after fasting for at least 8 hours.   BUN 16 8 - 23 mg/dL   Creatinine, Ser 9.66 (L) 0.44 - 1.00 mg/dL   Calcium  8.5 (L) 8.9 - 10.3 mg/dL   GFR, Estimated >39 >39 mL/min    Comment: (NOTE) Calculated using the CKD-EPI Creatinine Equation (2021)    Anion gap 7 5 - 15    Comment: Performed at [facility], [address] [place], [place] [address]  CBC     Status: Abnormal   Collection Time: 02/01/24  5:36 AM  Result Value Ref Range   WBC 9.0 4.0 - 10.5 K/uL   RBC 3.60 (L) 3.87 - 5.11 MIL/uL   Hemoglobin 11.0 (L) 12.0 - 15.0 g/dL   HCT 66.3 (L) 63.9 - 53.9 %   MCV 93.3 80.0 - 100.0 fL   MCH 30.6 26.0 - 34.0 pg   MCHC 32.7 30.0 - 36.0 g/dL   RDW 85.9 88.4 - 84.4 %   Platelets 196 150 - 400 K/uL   nRBC 0.0 0.0 - 0.2 %    Comment: Performed at [facility], [address] [place], [place] [address]  Magnesium      Status: None   Collection Time: 02/01/24  5:36 AM  Result Value Ref Range   Magnesium  1.9 1.7 - 2.4 mg/dL    Comment: Performed at [facility], [address] [place], [place] [address]  Phosphorus     Status: None   Collection Time: 02/01/24  5:36 AM  Result Value Ref Range   Phosphorus 2.9 2.5 - 4.6 mg/dL    Comment: Performed at [facility], [address] [place], [place] [address]  Basic metabolic panel     Status: Abnormal   Collection Time: 02/02/24  8:34 AM  Result Value Ref Range   Sodium 140 135 - 145 mmol/L   Potassium 3.3 (L) 3.5 - 5.1 mmol/L   Chloride 103 98 - 111 mmol/L   CO2 29 22 - 32 mmol/L   Glucose, Bld 107 (H) 70 - 99 mg/dL    Comment: Glucose reference range applies only to samples taken after fasting for at least 8 hours.   BUN 15 8 - 23 mg/dL  Creatinine, Ser 0.46 0.44 - 1.00 mg/dL   Calcium  8.9 8.9 - 10.3 mg/dL   GFR, Estimated >39 >39 mL/min    Comment: (NOTE) Calculated using the CKD-EPI Creatinine Equation (2021)    Anion gap 8 5 - 15    Comment: Performed at [facility], [address] [place], [place] [address]  CBC with Differential/Platelet     Status: Abnormal   Collection Time: 03/05/24 11:42 AM  Result Value Ref Range   WBC 7.2 3.8 - 10.8 Thousand/uL   RBC 4.06 3.80 - 5.10 Million/uL   Hemoglobin 12.4 11.7 - 15.5 g/dL   HCT 61.0 64.9 - 54.9 %   MCV 95.8 80.0 - 100.0 fL   MCH 30.5 27.0 - 33.0 pg   MCHC 31.9 (L) 32.0 - 36.0 g/dL    Comment: For adults, a slight decrease in the calculated MCHC value (in the range of 30 to 32 g/dL) is most likely not clinically significant; however, it should be interpreted with caution in correlation with other red cell parameters and the patient's clinical condition.    RDW 13.0 11.0 - 15.0 %   Platelets 231 140 - 400 Thousand/uL   MPV 10.9 7.5 - 12.5 fL   Neutro Abs 4,550 1,500 - 7,800 cells/uL   Absolute Lymphocytes 1,692 850 - 3,900 cells/uL   Absolute Monocytes 648 200 - 950 cells/uL   Eosinophils Absolute 259 15 - 500 cells/uL   Basophils Absolute 50 0 - 200 cells/uL   Neutrophils Relative % 63.2 %   Total Lymphocyte 23.5 %   Monocytes Relative 9.0 %   Eosinophils Relative 3.6 %   Basophils Relative 0.7 %  Comprehensive metabolic panel with GFR     Status: Abnormal   Collection Time: 03/05/24 11:42 AM  Result Value Ref Range   Glucose, Bld 108 (H) 65 - 99 mg/dL    Comment: .            Fasting reference interval . For someone without known diabetes, a glucose value between 100 and 125 mg/dL is consistent with prediabetes and should be confirmed with a follow-up test. .    BUN 24 7 - 25 mg/dL   Creat 9.35 9.39 - 9.04 mg/dL   eGFR 80 > OR = 60 fO/fpw/8.26f7   BUN/Creatinine Ratio SEE NOTE: 6 - 22 (calc)    Comment:    Not Reported: BUN and  Creatinine are within    reference range. .    Sodium 139 135 - 146 mmol/L   Potassium 4.2 3.5 - 5.3 mmol/L   Chloride 100 98 - 110 mmol/L   CO2 31 20 - 32 mmol/L   Calcium  9.7 8.6 - 10.4 mg/dL   Total Protein 6.8 6.1 - 8.1 g/dL   Albumin 4.0 3.6 - 5.1 g/dL   Globulin 2.8 1.9 - 3.7 g/dL (calc)   AG Ratio 1.4 1.0 - 2.5 (calc)   Total Bilirubin 0.5 0.2 - 1.2 mg/dL   Alkaline phosphatase (APISO) 58 37 - 153 U/L   AST 17 10 - 35 U/L   ALT 13 6 - 29 U/L  Hemoglobin A1c     Status: Abnormal   Collection Time: 03/05/24 11:42 AM  Result Value Ref Range   Hgb A1c MFr Bld 5.7 (H) <5.7 %    Comment: For someone without known diabetes, a hemoglobin  A1c value between 5.7% and 6.4% is consistent with prediabetes and should be confirmed with a  follow-up test. . For someone with  known diabetes, a value <7% indicates that their diabetes is well controlled. A1c targets should be individualized based on duration of diabetes, age, comorbid conditions, and other considerations. . This assay result is consistent with an increased risk of diabetes. . Currently, no consensus exists regarding use of hemoglobin A1c for diagnosis of diabetes for children. .    Mean Plasma Glucose 117 mg/dL   eAG (mmol/L) 6.5 mmol/L     Medical history:  Relevant past medical, surgical, family and social history reviewed and updated as indicated. Interim medical history since our last visit reviewed.  Allergies and medications reviewed and updated.   ROS: Negative unless specifically indicated above in HPI.    Current Outpatient Medications:    amLODipine  (NORVASC ) 5 MG tablet, Take 1 tablet (5 mg total) by mouth daily., Disp: 90 tablet, Rfl: 1   aspirin  EC 81 MG tablet, Take 81 mg by mouth daily. Swallow whole., Disp: , Rfl:    Cholecalciferol  50 MCG (2000 UT) CAPS, Take 1 capsule by mouth daily., Disp: , Rfl:    clopidogrel  (PLAVIX ) 75 MG tablet, Take 1 tablet (75 mg total) by mouth daily., Disp: 90  tablet, Rfl: 1   estradiol  (ESTRACE  VAGINAL) 0.1 MG/GM vaginal cream, Apply 0.5mg  (pea-sized amount)  just inside the vaginal introitus with a finger-tip on Monday, Wednesday and Friday nights., Disp: 30 g, Rfl: 12   ezetimibe  (ZETIA ) 10 MG tablet, Take 10 mg by mouth at bedtime., Disp: , Rfl:    isosorbide  mononitrate (IMDUR ) 30 MG 24 hr tablet, Take 120 mg by mouth daily., Disp: , Rfl:    metoprolol  succinate (TOPROL -XL) 100 MG 24 hr tablet, Take 1 tablet (100 mg total) by mouth at bedtime., Disp: 90 tablet, Rfl: 1   Multiple Vitamins-Minerals (PRESERVISION AREDS 2) CAPS, Take 1 capsule by mouth daily., Disp: , Rfl:    nitroGLYCERIN  (NITROSTAT ) 0.4 MG SL tablet, Place under the tongue., Disp: , Rfl:    rosuvastatin  (CRESTOR ) 10 MG tablet, Take 1 tablet (10 mg total) by mouth daily., Disp: 30 tablet, Rfl: 0   triamcinolone ointment (KENALOG) 0.1 %, Apply 1 Application topically 2 (two) times daily., Disp: , Rfl:        Objective:     BP (!) 158/81 (BP Location: Left Arm, Patient Position: Sitting, Cuff Size: Small)   Pulse 95   Ht 5' (1.524 m)   Wt 135 lb (61.2 kg)   SpO2 97%   BMI 26.37 kg/m   Wt Readings from Last 3 Encounters:  04/01/24 135 lb (61.2 kg)  03/05/24 134 lb (60.8 kg)  02/04/24 131 lb (59.4 kg)    Physical Exam  Physical Exam Vitals reviewed.  Constitutional:      Appearance: Normal appearance. Well-developed with normal weight.  Cardiovascular:     Rate and Rhythm: Normal rate and regular rhythm. Normal heart sounds. Normal peripheral pulses Pulmonary:     Normal breath sounds with normal effort Skin:    General: Skin is warm and dry without noticeable rash. Neurological:     General: No focal deficit present.  Psychiatric:        Mood and Affect: Mood, behavior and cognition normal      Assessment & Plan:   Encounter Diagnoses  Name Primary?   History of non-ST elevation myocardial infarction (NSTEMI) Yes   Thyroid mass    Essential hypertension     Coronary artery disease of native artery of native heart with stable angina pectoris     No  orders of the defined types were placed in this encounter.    Assessment and Plan    Atherosclerotic heart disease with angina  - Follow up with cardiologist as scheduled.  Continue current medications.  Essential hypertension Home blood pressure generally <140 mmHg; office reading 158 mmHg, normal for office visit. Current management effective. - Continue amlodipine  5 mg and metoprolol  100 mg.  Dizziness, likely orthostatic Occasional dizziness when rising at night, likely orthostatic hypotension. No hypotension at home. - Advise slow rising from lying or sitting. - Use walker for stability at night. - Monitor for increased dizziness or hypotension.  Chronic lower extremity edema Persistent leg swelling, especially in leg with previous ankle fracture. - Consider compression stockings. - Elevate legs when sitting.      Patient has a thyroid nodule for significant size but given her age no further workup is recommended.  Plan to make interventions to keep her out of the hospital and maintain quality of life.  High dose influenza vaccine given today

## 2024-04-01 NOTE — Addendum Note (Signed)
 Addended by: BLINDA DOUGLAS on: 04/01/2024 02:37 PM   Modules accepted: Orders

## 2024-04-06 ENCOUNTER — Other Ambulatory Visit: Payer: Self-pay

## 2024-04-15 ENCOUNTER — Ambulatory Visit: Payer: Self-pay | Admitting: Podiatry

## 2024-04-15 VITALS — Ht 60.0 in | Wt 135.0 lb

## 2024-04-15 DIAGNOSIS — B351 Tinea unguium: Secondary | ICD-10-CM

## 2024-04-15 DIAGNOSIS — M79674 Pain in right toe(s): Secondary | ICD-10-CM | POA: Diagnosis not present

## 2024-04-15 DIAGNOSIS — M79675 Pain in left toe(s): Secondary | ICD-10-CM

## 2024-04-15 NOTE — Progress Notes (Signed)
  Subjective:  Patient ID: Alexis Perez, female    DOB: [birth year],  MRN: [id number]  Chief Complaint  Patient presents with   Nail Problem    RM 3 RFC    89 y.o. female presents with the above complaint. History confirmed with patient.  Her nails are elongated and thickened and she has difficulty cutting them they cause pain in her shoes leneva elongated.  Objective:  Physical Exam: warm, good capillary refill, no trophic changes or ulcerative lesions, normal DP and PT pulses, normal sensory exam, and onychomycosis with dystrophy of right hallux nail, remaining nails are elongated. Assessment:   1. Pain due to onychomycosis of toenails of both feet      Plan:  Patient was evaluated and treated and all questions answered.  Discussed the etiology and treatment options for the condition in detail with the patient. Recommended debridement of the nails today. Sharp and mechanical debridement performed of all painful and mycotic nails today. Nails debrided in length and thickness using a nail nipper to level of comfort.     Return in about 3 months (around 07/16/2024) for painful thick fungal nails.

## 2024-04-20 NOTE — Telephone Encounter (Signed)
 Refill request received for patient.    Medication Requested: Metoprolol  Last Office Visit: Visit date not found  Next Office Visit: Visit date not found Last Prescriber: Durel  Nurse refill requirements met? No If not met, why: Last seen 01/2023  Sent to: Provider for signing If sent to provider, which provider?: Durel

## 2024-04-21 NOTE — Progress Notes (Unsigned)
 04/22/2024 4:51 PM   Alexis Perez 05-27-1924 [id number]  Referring provider: Warren Cramp, MD No address on file  Urological history: 1. Right UPJ obstruction  Chief Complaint  Patient presents with   Over Active Bladder   HPI: Alexis Perez is a 89 y.o. woman who presents today for hospital follow-up with her daughter, Alexis Perez.    Previous records reviewed.   She was recently admitted in August for weakness after taking two Macrobid  for an E.coli UTI.  Her lactic acid and serum creatinine were normal,  her WBC count was mildly elevated, blood cultures were negative, and UA w/ 11-20 squames, rare bacteria, and 21-50 WBC's.  No urine culture was sent.    She is having 1-7 daytime voids, 3 or more episodes of nocturia with a mild urge to urinate.  She has urge incontinence.  She is leaking 3 or more times daily.  She wears pads daily.  She does not limit fluid intake and she does engage in toilet mapping.  CATH UA nitrate positive, 2+ leukocyte, greater than 30 WBCs, 0-2 RBCs, 0-10 epithelial cells and many bacteria.  PVR 210 mL   Serum creatinine (03/2024) 0.64, eGFR 80  Hemoglobin A1c (03/2024) 5.7   Pessary in place.    She states she feels fine today, but she is nervous because she states she felt fine before she ended up in the hospital with sepsis.    PMH: Past Medical History:  Diagnosis Date   Allergy    Arthritis    Cancer (HCC) cells only   Cataract    Complicated urinary tract infection 01/31/2024   Heart murmur    Hypertension    Myocardial infarction (HCC)    Osteoporosis    Pyuria 10/03/2021   Sepsis due to Escherichia coli (E. coli) (HCC) 12/18/2023    Surgical History: Past Surgical History:  Procedure Laterality Date   ANKLE FRACTURE SURGERY Left 1999   COLON SURGERY  polyps   FRACTURE SURGERY     SMALL INTESTINE SURGERY  polyps    Home Medications:  Allergies as of 04/22/2024       Reactions   Fentanyl Nausea And Vomiting    Sodium Pentobarbital [pentobarbital] Anaphylaxis   When pt was sedated for surgery   Hydrochlorothiazide Other (See Comments)   hyponatremia   Ciprofloxacin     Blurry vision, sleepy and make pt sick   Codeine Other (See Comments)   Other reaction(s): UNKNOWN   Macrobid  [nitrofurantoin ] Nausea And Vomiting   Penicillin G Rash   Sulfa Antibiotics Rash        Medication List        Accurate as of April 22, 2024  4:51 PM. If you have any questions, ask your nurse or doctor.          amLODipine  5 MG tablet Commonly known as: NORVASC  Take 1 tablet (5 mg total) by mouth daily.   aspirin  EC 81 MG tablet Take 81 mg by mouth daily. Swallow whole.   Cholecalciferol  50 MCG (2000 UT) Caps Take 1 capsule by mouth daily.   clopidogrel  75 MG tablet Commonly known as: PLAVIX  Take 1 tablet (75 mg total) by mouth daily.   estradiol  0.1 MG/GM vaginal cream Commonly known as: ESTRACE  VAGINAL Apply 0.5mg  (pea-sized amount)  just inside the vaginal introitus with a finger-tip on Monday, Wednesday and Friday nights.   ezetimibe  10 MG tablet Commonly known as: ZETIA  Take 10 mg by mouth at bedtime.   isosorbide  mononitrate 30  MG 24 hr tablet Commonly known as: IMDUR  Take 120 mg by mouth daily.   metoprolol  succinate 100 MG 24 hr tablet Commonly known as: TOPROL -XL Take 1 tablet (100 mg total) by mouth at bedtime.   nitroGLYCERIN  0.4 MG SL tablet Commonly known as: NITROSTAT  Place under the tongue.   PreserVision AREDS 2 Caps Take 1 capsule by mouth daily.   rosuvastatin  10 MG tablet Commonly known as: CRESTOR  Take 1 tablet (10 mg total) by mouth daily.   triamcinolone ointment 0.1 % Commonly known as: KENALOG Apply 1 Application topically 2 (two) times daily.        Allergies:  Allergies  Allergen Reactions   Fentanyl Nausea And Vomiting   Sodium Pentobarbital [Pentobarbital] Anaphylaxis    When pt was sedated for surgery   Hydrochlorothiazide Other (See  Comments)    hyponatremia   Ciprofloxacin      Blurry vision, sleepy and make pt sick   Codeine Other (See Comments)    Other reaction(s): UNKNOWN   Macrobid  [Nitrofurantoin ] Nausea And Vomiting   Penicillin G Rash   Sulfa Antibiotics Rash    Family History: No family history on file.  Social History: See HPI for pertinent social history  ROS: Pertinent ROS in HPI  Physical Exam: BP (!) 166/81   Pulse (!) 109   Ht 5' (1.524 m)   Wt 134 lb (60.8 kg)   BMI 26.17 kg/m   Constitutional:  Well nourished. Alert and oriented, No acute distress. HEENT: [place] AT, moist mucus membranes.  Trachea midline Cardiovascular: No clubbing, cyanosis, or edema. Respiratory: Normal respiratory effort, no increased work of breathing. Neurologic: Grossly intact, no focal deficits, moving all 4 extremities. Psychiatric: Normal mood and affect.    Laboratory Data: See EPIC and HPI  I have reviewed the labs.   Pertinent Imaging: N/A  Assessment & Plan:    1. Right UPJ obstruction  - serum creatinine in September was normal  2. Genitourinary Syndrome of Menopause (GSM)  - continue applying vaginal estrogen cream three nights weekly   3. Pelvic prolapse - managed with a pessary - needs to be cleaned q 90 days  4.  History of sepsis secondary to UTI - recent hospitalizations x 2 - UA is nitrite positive, we did discuss colonization versus recurrent UTIs and since she was not having symptoms at this time, we agreed to wait until urine culture resulted and then discuss further whether or not she was having symptoms and would need to start antibiotic at that time, in the meantime I asked her to reach out to the office if she should develop symptoms of UTI so that we could start an antibiotic   Return for Follow up pending labs.  These notes generated with voice recognition software. I apologize for typographical errors.  Alexis Perez  [facility] [address]  [address] [place], [place] [address] [phone]

## 2024-04-22 ENCOUNTER — Ambulatory Visit: Admitting: Urology

## 2024-04-22 ENCOUNTER — Encounter: Payer: Self-pay | Admitting: Urology

## 2024-04-22 VITALS — BP 166/81 | HR 109 | Ht 60.0 in | Wt 134.0 lb

## 2024-04-22 DIAGNOSIS — Z8619 Personal history of other infectious and parasitic diseases: Secondary | ICD-10-CM | POA: Diagnosis not present

## 2024-04-22 DIAGNOSIS — N8111 Cystocele, midline: Secondary | ICD-10-CM

## 2024-04-22 DIAGNOSIS — N135 Crossing vessel and stricture of ureter without hydronephrosis: Secondary | ICD-10-CM

## 2024-04-22 DIAGNOSIS — N952 Postmenopausal atrophic vaginitis: Secondary | ICD-10-CM | POA: Diagnosis not present

## 2024-04-22 LAB — MICROSCOPIC EXAMINATION: WBC, UA: >30 /HPF — AB (ref 0–5)

## 2024-04-22 LAB — URINALYSIS, COMPLETE
Bilirubin, UA: NEGATIVE
Glucose, UA: NEGATIVE
Ketones, UA: NEGATIVE
Nitrite, UA: POSITIVE — AB
Protein,UA: NEGATIVE
RBC, UA: NEGATIVE
Specific Gravity, UA: 1.01 (ref 1.005–1.030)
Urobilinogen, Ur: 0.2 mg/dL (ref 0.2–1.0)
pH, UA: 6 (ref 5.0–7.5)

## 2024-04-22 NOTE — Patient Instructions (Signed)
 Recurrent UTI Prevention Strategies   Stay well hydrated. Get a moderate amount of exercise. Eat a diet rich in fruit and vegetables. Start a bowel regimen to manage your constipation. Your goal is to have consistent, formed bowel movements that are easy for you to pass. You may use either of the over-the-counter supplements Benefiber or Miralax to help with this. I recommend that you try Benefiber first and move on to Miralax if this is not helping you enough. You may adjust the recommended dose of Miralax (one capful daily) to achieve this goal. Start taking an over-the-counter cranberry supplement for urinary tract health. Take this once or twice daily on an empty stomach, e.g. right before bed. Start taking an over-the-counter d-mannose supplement. Take this daily per packaging instructions. Start taking an over-the-counter probiotic containing the bacterial species called Lactobacillus. Take this daily. Start vaginal estrogen cream. Apply a pea-sized amount around the opening of the urethra every day for 2 weeks, then three times weekly forever.

## 2024-04-22 NOTE — Progress Notes (Signed)
 In and Out Catheterization  Patient is present today for a I & O catheterization due to needing clean catch UA. Patient was cleaned and prepped in a sterile fashion with betadine . A 16FR cath was inserted no complications were noted , of urine return was noted, urine was yellow in color. A clean urine sample was collected for UA. Bladder was drained  And catheter was removed with out difficulty.    Performed by: Mathew Pinal, RN  Follow up/ Additional notes: N/A

## 2024-04-28 ENCOUNTER — Ambulatory Visit: Payer: Self-pay | Admitting: Urology

## 2024-04-28 LAB — CULTURE, URINE COMPREHENSIVE

## 2024-05-02 ENCOUNTER — Other Ambulatory Visit: Payer: Self-pay

## 2024-05-02 ENCOUNTER — Emergency Department
Admission: EM | Admit: 2024-05-02 | Discharge: 2024-05-02 | Disposition: A | Discharge status: Home / Self Care | Attending: Emergency Medicine | Admitting: Emergency Medicine

## 2024-05-02 ENCOUNTER — Emergency Department

## 2024-05-02 DIAGNOSIS — R079 Chest pain, unspecified: Secondary | ICD-10-CM

## 2024-05-02 DIAGNOSIS — R0789 Other chest pain: Secondary | ICD-10-CM | POA: Insufficient documentation

## 2024-05-02 DIAGNOSIS — I2510 Atherosclerotic heart disease of native coronary artery without angina pectoris: Secondary | ICD-10-CM | POA: Insufficient documentation

## 2024-05-02 DIAGNOSIS — Z79899 Other long term (current) drug therapy: Secondary | ICD-10-CM | POA: Insufficient documentation

## 2024-05-02 DIAGNOSIS — I10 Essential (primary) hypertension: Secondary | ICD-10-CM | POA: Insufficient documentation

## 2024-05-02 HISTORY — DX: Atherosclerotic heart disease of native coronary artery without angina pectoris: I25.10

## 2024-05-02 LAB — CBC
HCT: 42.3 % (ref 36.0–46.0)
Hemoglobin: 13.4 g/dL (ref 12.0–15.0)
MCH: 30 pg (ref 26.0–34.0)
MCHC: 31.7 g/dL (ref 30.0–36.0)
MCV: 94.8 fL (ref 80.0–100.0)
Platelets: 222 K/uL (ref 150–400)
RBC: 4.46 MIL/uL (ref 3.87–5.11)
RDW: 14.6 % (ref 11.5–15.5)
WBC: 7.6 K/uL (ref 4.0–10.5)
nRBC: 0 % (ref 0.0–0.2)

## 2024-05-02 LAB — BASIC METABOLIC PANEL WITH GFR
Anion gap: 11 (ref 5–15)
BUN: 29 mg/dL — ABNORMAL HIGH (ref 8–23)
CO2: 29 mmol/L (ref 22–32)
Calcium: 9.5 mg/dL (ref 8.9–10.3)
Chloride: 98 mmol/L (ref 98–111)
Creatinine, Ser: 0.59 mg/dL (ref 0.44–1.00)
GFR, Estimated: >60 mL/min (ref >60)
Glucose, Bld: 115 mg/dL — ABNORMAL HIGH (ref 70–99)
Potassium: 3.4 mmol/L — ABNORMAL LOW (ref 3.5–5.1)
Sodium: 138 mmol/L (ref 135–145)

## 2024-05-02 LAB — URINALYSIS, W/ REFLEX TO CULTURE (INFECTION SUSPECTED)
Bilirubin Urine: NEGATIVE
Glucose, UA: NEGATIVE mg/dL
Hgb urine dipstick: NEGATIVE
Ketones, ur: NEGATIVE mg/dL
Nitrite: POSITIVE — AB
Protein, ur: NEGATIVE mg/dL
Specific Gravity, Urine: 1.009 (ref 1.005–1.030)
WBC, UA: >50 WBC/hpf (ref 0–5)
pH: 7 (ref 5.0–8.0)

## 2024-05-02 LAB — TROPONIN I (HIGH SENSITIVITY)
Troponin I (High Sensitivity): 16 ng/L (ref <18)
Troponin I (High Sensitivity): 18 ng/L — ABNORMAL HIGH (ref <18)

## 2024-05-02 NOTE — Discharge Instructions (Addendum)
 You were seen in the Emergency Department today for evaluation of your chest pain. Fortunately, your labs, EKG, and chest x-Alexis Perez were overall reassuring against a emergency cause for your pain. I have placed a referral to cardiology for further evaluation of your chest pain.  Return to the ER for any new or worsening symptoms including worsening chest pain, difficulty breathing, or any other new or concerning symptoms that you believe warrants immediate attention.

## 2024-05-02 NOTE — ED Triage Notes (Signed)
 Pt to ED with daughter for chest pressure this morning which lasted about 20 minutes and has hx MI.  Pt forgot to take metoprolol  last night and BP was 220s/100s this morning.  Pt is ambulatory with walker. Skin is dry, respirations unlabored.

## 2024-05-02 NOTE — ED Provider Notes (Signed)
 [facility] Provider Note    Event Date/Time   First MD Initiated Contact with Patient 05/02/24 1052     (approximate)   History   Chest Pain and Hypertension   HPI  Alexis Perez is a 89 year old female with history of hypertension, CAD, right UPJ obstruction presenting to the ER for evaluation of chest pressure.  Patient forgot to take her metoprolol  last night.  This morning she noticed that her blood pressure was in the 220s over 100s.  She reports about a 20-minute episode of chest pressure and generally feeling unwell.  Chest pain has improved.    Physical Exam   Triage Vital Signs: ED Triage Vitals  Encounter Vitals Group     BP 05/02/24 0937 (!) 181/86     Girls Systolic BP Percentile --      Girls Diastolic BP Percentile --      Boys Systolic BP Percentile --      Boys Diastolic BP Percentile --      Pulse Rate 05/02/24 0937 92     Resp 05/02/24 0937 16     Temp 05/02/24 0937 97.7 F (36.5 C)     Temp Source 05/02/24 0937 Oral     SpO2 05/02/24 0937 99 %     Weight 05/02/24 0935 133 lb 13.1 oz (60.7 kg)     Height 05/02/24 0935 5' (1.524 m)     Head Circumference --      Peak Flow --      Pain Score 05/02/24 0933 0     Pain Loc --      Pain Education --      Exclude from Growth Chart --     Most recent vital signs: Vitals:   05/02/24 1200 05/02/24 1300  BP: (!) 146/66 (!) 149/72  Pulse: 73 76  Resp: 18 18  Temp:    SpO2: 100% 100%     General: Awake, interactive  CV:  Good peripheral perfusion Resp:  Unlabored respirations, lungs clear to auscultation Abd:  Nondistended, soft, nontender Neuro:  Symmetric facial movement, fluid speech, moving extremities spontaneously and equally   ED Results / Procedures / Treatments   Labs (all labs ordered are listed, but only abnormal results are displayed) Labs Reviewed  BASIC METABOLIC PANEL WITH GFR - Abnormal; Notable for the following components:      Result Value    Potassium 3.4 (*)    Glucose, Bld 115 (*)    BUN 29 (*)    All other components within normal limits  URINALYSIS, W/ REFLEX TO CULTURE (INFECTION SUSPECTED) - Abnormal; Notable for the following components:   Color, Urine YELLOW (*)    APPearance CLOUDY (*)    Nitrite POSITIVE (*)    Leukocytes,Ua LARGE (*)    Bacteria, UA RARE (*)    All other components within normal limits  TROPONIN I (HIGH SENSITIVITY) - Abnormal; Notable for the following components:   Troponin I (High Sensitivity) 18 (*)    All other components within normal limits  URINE CULTURE  CBC  TROPONIN I (HIGH SENSITIVITY)     EKG EKG independently reviewed and interpreted by myself demonstrates:  EKG demonstrates sinus rhythm at a rate of 95, PR 230, QRS 74, QTc 447, nonspecific ST changes, no STEMI  RADIOLOGY Imaging independently reviewed and interpreted by myself demonstrates:  CXR without focal consolidation  Formal Radiology Read:  DG Chest 2 View Result Date: 05/02/2024 EXAM: 2 VIEW(S) XRAY OF THE CHEST  05/02/2024 10:09:00 AM COMPARISON: 01/31/2024 CLINICAL HISTORY: transient episode of chest pressure this AM FINDINGS: LUNGS AND PLEURA: No focal pulmonary opacity. No pulmonary edema. No pleural effusion. No pneumothorax. HEART AND MEDIASTINUM: Calcified and tortuous aorta. BONES AND SOFT TISSUES: Osteopenia. Thoracic spondylosis. IMPRESSION: 1. No acute process. Electronically signed by: [name] Coho MD 05/02/2024 10:20 AM EDT RP Workstation: [id number]    PROCEDURES:  Critical Care performed: No  Procedures   MEDICATIONS ORDERED IN ED: Medications - No data to display   IMPRESSION / MDM / ASSESSMENT AND PLAN / ED COURSE  I reviewed the triage vital signs and the nursing notes.  Differential diagnosis includes, but is not limited to, ACS, pneumonia, pneumothorax, low suspicion PE or dissection given complete resolution of chest pain,  Patient's presentation is most consistent with acute  presentation with potential threat to life or bodily function.  89 year old female presenting to the emergency department for evaluation of chest pain.  Chest pain improved on presentation here.  Blood pressure also improved without intervention.  Labs with reassuring CBC, BMP, negative initial troponin.  Repeat minimally increased, overall stable.  Urinalysis sent which was concerning for infection, but on review of patient's urology notes it does appear that there is concerns for colonization.  Discussed with patient.  No urinary symptoms.  Will send culture, but defer on antibiotics for now.  Patient with risk factors, but with her age and improved symptoms, do think outpatient follow-up is reasonable and family is agreeable with this plan. Strict return precautions provided.  Patient discharged in stable condition.     FINAL CLINICAL IMPRESSION(S) / ED DIAGNOSES   Final diagnoses:  Nonspecific chest pain     Rx / DC Orders   ED Discharge Orders          Ordered    Ambulatory referral to Cardiology       Comments: If you have not heard from the Cardiology office within the next 72 hours please call [phone].   05/02/24 1353             Note:  This document was prepared using Dragon voice recognition software and may include unintentional dictation errors.   Levander Slate, MD 05/02/24 [phone]

## 2024-05-04 LAB — URINE CULTURE: Culture: >=100000 — AB

## 2024-05-05 ENCOUNTER — Encounter: Payer: Self-pay | Admitting: Cardiovascular Disease

## 2024-05-05 ENCOUNTER — Ambulatory Visit: Attending: Cardiovascular Disease | Admitting: Cardiovascular Disease

## 2024-05-05 VITALS — BP 140/70 | HR 95 | Ht 60.0 in | Wt 135.1 lb

## 2024-05-05 DIAGNOSIS — I25118 Atherosclerotic heart disease of native coronary artery with other forms of angina pectoris: Secondary | ICD-10-CM

## 2024-05-05 DIAGNOSIS — I350 Nonrheumatic aortic (valve) stenosis: Secondary | ICD-10-CM

## 2024-05-05 DIAGNOSIS — I10 Essential (primary) hypertension: Secondary | ICD-10-CM | POA: Diagnosis not present

## 2024-05-05 MED ORDER — NITROGLYCERIN 0.4 MG SL SUBL: 0.4000 mg | SUBLINGUAL_TABLET | SUBLINGUAL | 3 refills | Status: AC | PRN

## 2024-05-05 NOTE — Patient Instructions (Signed)

## 2024-05-05 NOTE — Progress Notes (Signed)
 Cardiology Office Note  Date:  05/05/2024   ID:  Alexis Perez, DOB 20-Jan-1924, MRN [id number]  PCP:  Everlene Parris LABOR, MD   Chief Complaint  Patient presents with   Follow up [facility] ER; chest pain.     Patient c/o bilateral LE edema and chest pain.     HPI:  Alexis Perez is a 89 y.o. female with past medical history of: CAD.  LHC 10/19/2021 performed at [facility]: Proximal LCx 95%.  Proximal to mid LAD 80%.  Occluded RCA with right to left collaterals.  PCI with rotational atherectomy and DES 2.5 x 28 mm to proximal LCx. Aortic valve stenosis. Echo 12/19/2023: EF 60 to 65%.  No RWMA.  Moderate LVH.  Grade I DD.  Normal RV size/function.  Normal PA pressure, RVSP 26.9 mmHg.  Moderate MAC.  Moderate calcification of aortic valve.  Moderate aortic valve stenosis, mean gradient 15 mmHg. Hypertension.  Syncope. Dementia. Labile pressure Who presents for routine follow-up of her coronary disease, aortic valve stenosis  Last seen by one of our providers July 2025  Watching [employer], got excited, they were losing, got upset,  developed chest pain Seen in the emergency room May 02, 2024 chest pressure,  that is what they called it Forgot to take metoprolol  night before, blood pressure 220s over 100 Blood pressure improved in the ER down to 140s over 60s UA concerning for UTI, culture sent, growing E. coli Troponin 16, 18  Urine culture 2 weeks ago with E. Coli Followed by urology She feels they did not do a clean-catch, is inclined not to treat  Off crestor   Walks with a walker, no falls Lives with 2 daughters, 3 other live nearby  EKG personally reviewed by myself on todays visit EKG Interpretation Date/Time:  Tuesday May 05 2024 13:52:11 EST Ventricular Rate:  95 PR Interval:  212 QRS Duration:  78 QT Interval:  368 QTC Calculation: 462 R Axis:   -4  Text Interpretation: Sinus rhythm with sinus arrhythmia with 1st degree A-V block Poor R wave  progression When compared with ECG of 02-May-2024 09:35, No significant change was found Confirmed by Perla Lye [phone]) on 05/05/2024 1:54:40 PM   Prior cardiac history reviewed  s/p PCI with DES to proximal LCx April 2023 performed at [facility].    Echo in April 2023 showed EF 60 to 65%, no RWMA, moderate LVH, grade 2 DD, normal RV size/function, mild to moderate MR, mild aortic stenosis.   [facility] ED on 12/17/2023 for syncope, UTI  brief episodes of passing out x 3 days.    Troponin 174>> 571>> 2751>> 4599.   EKG with ST depression in anterolateral leads.   Chest x-ray without acute findings.   CTA chest abdomen pelvis demonstrated no thoracoabdominal aortic aneurysm or dissection, moderate right hydronephrosis with urothelial thickening possibly reflecting UPJ stenosis, possible mild interstitial edema.   CT head without acute intracranial process.   Echo demonstrated normal LV/RV function, moderate aortic valve stenosis as detailed above.   medical management with no plan for ischemic workup.   discharged on 12/21/2023.   PMH:   has a past medical history of Allergy, Arthritis, Cancer (HCC) (cells only), Cataract, Complicated urinary tract infection (01/31/2024), Coronary artery disease, Heart murmur, Hypertension, Myocardial infarction (HCC), Osteoporosis, Pyuria (10/03/2021), and Sepsis due to Escherichia coli (E. coli) (HCC) (12/18/2023).   PSH:    Past Surgical History:  Procedure Laterality Date   ANKLE FRACTURE SURGERY Left 1999  COLON SURGERY  polyps   FRACTURE SURGERY     SMALL INTESTINE SURGERY  polyps    Current Outpatient Medications  Medication Sig Dispense Refill   amLODipine  (NORVASC ) 5 MG tablet Take 1 tablet (5 mg total) by mouth daily. 90 tablet 1   aspirin  EC 81 MG tablet Take 81 mg by mouth daily. Swallow whole.     Cholecalciferol  50 MCG (2000 UT) CAPS Take 1 capsule by mouth daily.     clopidogrel  (PLAVIX ) 75 MG tablet Take 1 tablet (75 mg  total) by mouth daily. 90 tablet 1   estradiol  (ESTRACE  VAGINAL) 0.1 MG/GM vaginal cream Apply 0.5mg  (pea-sized amount)  just inside the vaginal introitus with a finger-tip on Monday, Wednesday and Friday nights. 30 g 12   ezetimibe  (ZETIA ) 10 MG tablet Take 10 mg by mouth at bedtime.     isosorbide  mononitrate (IMDUR ) 30 MG 24 hr tablet Take 120 mg by mouth daily.     metoprolol  succinate (TOPROL -XL) 100 MG 24 hr tablet Take 1 tablet (100 mg total) by mouth at bedtime. 90 tablet 1   Multiple Vitamins-Minerals (PRESERVISION AREDS 2) CAPS Take 1 capsule by mouth daily.     nitroGLYCERIN  (NITROSTAT ) 0.4 MG SL tablet Place under the tongue.     triamcinolone ointment (KENALOG) 0.1 % Apply 1 Application topically 2 (two) times daily.     No current facility-administered medications for this visit.    Allergies:   Fentanyl, Sodium pentobarbital [pentobarbital], Hydrochlorothiazide, Ciprofloxacin , Codeine, Macrobid  [nitrofurantoin ], Penicillin g, and Sulfa antibiotics   Social History:  The patient  reports that she quit smoking about 68 years ago. Her smoking use included cigarettes. She has a 5 pack-year smoking history. She has never used smokeless tobacco. She reports that she does not drink alcohol  and does not use drugs.   Family History:   family history is not on file.    Review of Systems: Review of Systems  Constitutional: Negative.   HENT: Negative.    Respiratory: Negative.    Cardiovascular:  Positive for leg swelling.  Gastrointestinal: Negative.   Musculoskeletal: Negative.   Neurological: Negative.   Psychiatric/Behavioral: Negative.    All other systems reviewed and are negative.   PHYSICAL EXAM: VS:  BP (!) 140/70 (BP Location: Left Arm, Patient Position: Sitting, Cuff Size: Normal)   Pulse 95   Ht 5' (1.524 m)   Wt 135 lb 2 oz (61.3 kg)   SpO2 97%   BMI 26.39 kg/m  , BMI Body mass index is 26.39 kg/m. GEN: Well nourished, well developed, in no acute  distress HEENT: normal Neck: no JVD, carotid bruits, or masses Cardiac: RRR; 2-3/6 SEM RSB,  no rubs, or gallops,no edema  Respiratory:  clear to auscultation bilaterally, normal work of breathing GI: soft, nontender, nondistended, + BS MS: no deformity or atrophy Skin: warm and dry, no rash Neuro:  Strength and sensation are intact Psych: euthymic mood, full affect  Recent Labs: 02/01/2024: Magnesium  1.9 03/05/2024: ALT 13 05/02/2024: BUN 29; Creatinine, Ser 0.59; Hemoglobin 13.4; Platelets 222; Potassium 3.4; Sodium 138    Lipid Panel Lab Results  Component Value Date   CHOL 159 10/04/2021   HDL 58 10/04/2021   LDLCALC 92 10/04/2021   TRIG 44 10/04/2021      Wt Readings from Last 3 Encounters:  05/05/24 135 lb 2 oz (61.3 kg)  05/02/24 133 lb 13.1 oz (60.7 kg)  04/22/24 134 lb (60.8 kg)    ASSESSMENT AND PLAN:  Problem  List Items Addressed This Visit       Cardiology Problems   Coronary artery disease - Primary   Relevant Orders   EKG 12-Lead (Completed)   Other Visit Diagnoses       Aortic valve stenosis, etiology of cardiac valve disease unspecified       Relevant Orders   EKG 12-Lead (Completed)     Primary hypertension       Relevant Orders   EKG 12-Lead (Completed)      CAD with stable angina Recent episode of chest discomfort, high blood pressure in the setting of getting upset over [employer] Blood pressure came down on its own in the emergency room, troponins essentially normal, no acute findings Since then has felt well, she is inclined not to pursue any testing -Event for recurrent chest pain she take nitro sublingual - If blood pressure stays high she could take extra amlodipine   Essential hypertension Blood pressure reasonably well-controlled on today's visit, no changes made Trace lower extremity edema, possibly exacerbated by amlodipine  though she does report prior trauma and hardware in her lower extremities likely contributing to swelling  which is chronic  Hyperlipidemia Notes indicating she is off Crestor   Aortic valve stenosis Mean gradient mild to moderate, prominent murmur on exam, asymptomatic  UTI Growing E. coli, she reports she is asymptomatic, likely colonized. Followed by urology, patient is inclined not to treat   Signed, Velinda Lunger, M.D., Ph.D. [facility] [place], [place] [phone]

## 2024-05-14 ENCOUNTER — Ambulatory Visit
Admission: RE | Admit: 2024-05-14 | Discharge: 2024-05-14 | Disposition: A | Discharge status: Home / Self Care | Source: Ambulatory Visit

## 2024-05-14 VITALS — BP 140/68 | HR 82 | Temp 98.2°F | Resp 18

## 2024-05-14 DIAGNOSIS — S81812A Laceration without foreign body, left lower leg, initial encounter: Secondary | ICD-10-CM | POA: Diagnosis not present

## 2024-05-14 NOTE — ED Triage Notes (Signed)
 Patient reports skin tear to left lower leg on car door that happened on Sunday. Bleeding controlled with bandage. Rates pain 2/10. Patient on Plavix  and has stopped for 4 days due skin tear.

## 2024-05-14 NOTE — ED Provider Notes (Signed)
 CAY RALPH PELT    CSN: [id number] Arrival date & time: 05/14/24  1030      History   Chief Complaint Chief Complaint  Patient presents with   Laceration    Skin tear    HPI Alexis Perez is a 89 y.o. female.    Patient presents for evaluation of a skin tear present to the left lower leg beginning 4 days ago after injury.  Endorses that lake was hit by the edge of a car door.  Wound was cleaned and covered with a bandage by her daughter, believes topical antibiotic ointment applied.  Denies drainage, pain, redness or swelling.  Patient endorses that she stopped use of her Plavix  over the past 4 days due to the injury.    Past Medical History:  Diagnosis Date   Allergy    Arthritis    Cancer (HCC) cells only   Cataract    Complicated urinary tract infection 01/31/2024   Coronary artery disease    Heart murmur    Hypertension    Myocardial infarction (HCC)    hx 2 or 3 MI, unsure if stemi or nstemi   Osteoporosis    Pyuria 10/03/2021   Sepsis due to Escherichia coli (E. coli) (HCC) 12/18/2023    Patient Active Problem List   Diagnosis Date Noted   Thyroid mass 04/01/2024   History of colon cancer 03/05/2024   Advanced age 71/07/2023   Hydronephrosis, right 12/18/2023   Aortic stenosis, mild 12/17/2023   ARMD (age related macular degeneration) 12/17/2023   Non-ST elevation (NSTEMI) myocardial infarction (HCC) 10/03/2021   Essential hypertension 10/03/2021   Coronary artery disease 10/03/2021   History of non-ST elevation myocardial infarction (NSTEMI) 10/03/2021   Hyperlipidemia 06/24/2021   Age-related osteoporosis without current pathological fracture 02/04/2013   Benign neoplasm of colon 12/19/2010   Osteoarthrosis, generalized, involving multiple sites 12/19/2010   Uterine prolapse 12/19/2010    Past Surgical History:  Procedure Laterality Date   ANKLE FRACTURE SURGERY Left 1999   COLON SURGERY  polyps   FRACTURE SURGERY     SMALL INTESTINE  SURGERY  polyps    OB History     Gravida  7   Para  7   Term  7   Preterm      AB      Living         SAB      IAB      Ectopic      Multiple      Live Births               Home Medications    Prior to Admission medications   Medication Sig Start Date End Date Taking? Authorizing Provider  amLODipine  (NORVASC ) 5 MG tablet Take 1 tablet (5 mg total) by mouth daily. 01/08/24   Loistine Sober, NP  aspirin  EC 81 MG tablet Take 81 mg by mouth daily. Swallow whole.    [provider]  Cholecalciferol  50 MCG (2000 UT) CAPS Take 1 capsule by mouth daily.    [provider]  clopidogrel  (PLAVIX ) 75 MG tablet Take 1 tablet (75 mg total) by mouth daily. 01/08/24   Loistine Sober, NP  estradiol  (ESTRACE  VAGINAL) 0.1 MG/GM vaginal cream Apply 0.5mg  (pea-sized amount)  just inside the vaginal introitus with a finger-tip on Monday, Wednesday and Friday nights. 01/21/24   Helon Kirsch A, PA-C  ezetimibe  (ZETIA ) 10 MG tablet Take 10 mg by mouth at bedtime. 07/22/21   [provider]  isosorbide  mononitrate (IMDUR ) 30 MG 24 hr tablet Take 120 mg by mouth daily. 08/20/21   [provider]  metoprolol  succinate (TOPROL -XL) 100 MG 24 hr tablet Take 1 tablet (100 mg total) by mouth at bedtime. 01/08/24   Loistine Sober, NP  Multiple Vitamins-Minerals (PRESERVISION AREDS 2) CAPS Take 1 capsule by mouth daily. 03/19/19   [provider]  nitroGLYCERIN  (NITROSTAT ) 0.4 MG SL tablet Place 1 tablet (0.4 mg total) under the tongue every 5 (five) minutes as needed for chest pain. 05/05/24   Gollan, Timothy J, MD  triamcinolone ointment (KENALOG) 0.1 % Apply 1 Application topically 2 (two) times daily. 11/18/23   [provider]    Family History No family history on file.  Social History Social History   Tobacco Use   Smoking status: Former    Current packs/day: 0.00    Average packs/day: 0.5 packs/day for 10.0 years (5.0  ttl pk-yrs)    Types: Cigarettes    Quit date: 09/14/1955    Years since quitting: 68.7   Smokeless tobacco: Never  Vaping Use   Vaping status: Never Used  Substance Use Topics   Alcohol  use: Never   Drug use: Never     Allergies   Fentanyl, Sodium pentobarbital [pentobarbital], Hydrochlorothiazide, Ciprofloxacin , Codeine, Macrobid  [nitrofurantoin ], Penicillin g, and Sulfa antibiotics   Review of Systems Review of Systems   Physical Exam Triage Vital Signs ED Triage Vitals [05/14/24 1123]  Encounter Vitals Group     BP (!) 140/68     Girls Systolic BP Percentile      Girls Diastolic BP Percentile      Boys Systolic BP Percentile      Boys Diastolic BP Percentile      Pulse Rate 82     Resp 18     Temp 98.2 F (36.8 C)     Temp Source Oral     SpO2 97 %     Weight      Height      Head Circumference      Peak Flow      Pain Score 2     Pain Loc      Pain Education      Exclude from Growth Chart    No data found.  Updated Vital Signs BP (!) 140/68 (BP Location: Left Arm)   Pulse 82   Temp 98.2 F (36.8 C) (Oral)   Resp 18   SpO2 97%   Visual Acuity Right Eye Distance:   Left Eye Distance:   Bilateral Distance:    Right Eye Near:   Left Eye Near:    Bilateral Near:     Physical Exam Constitutional:      Appearance: Normal appearance.  Eyes:     Extraocular Movements: Extraocular movements intact.  Pulmonary:     Effort: Pulmonary effort is normal.  Skin:    Comments: 2 x 2 cm skin tear present to the lateral aspect of the left lower leg, bleeding controlled  Neurological:     Mental Status: She is alert and oriented to person, place, and time. Mental status is at baseline.      UC Treatments / Results  Labs (all labs ordered are listed, but only abnormal results are displayed) Labs Reviewed - No data to display  EKG   Radiology No results found.  Procedures Procedures (including critical care time)  Medications Ordered in  UC Medications - No data to display  Initial Impression /  Assessment and Plan / UC Course  I have reviewed the triage vital signs and the nursing notes.  Pertinent labs & imaging results that were available during my care of the patient were reviewed by me and considered in my medical decision making (see chart for details).  Uninfected skin tear of the left lower extremity, initial encounter  Able to approximate the skin over the wound, cleansed with dermal wash, Steri-Strips applied, advise daily cleansing, may apply topical antibiotic ointment per preference advised to monitor for signs of infection and to return if they occur, patient instructed to restart her Plavix  today and discussed that she is not to stop medication unless directly instructed by a healthcare provider Final Clinical Impressions(s) / UC Diagnoses   Final diagnoses:  None   Discharge Instructions   None    ED Prescriptions   None    PDMP not reviewed this encounter.   Teresa Shelba SAUNDERS, NP 05/14/24 1159

## 2024-05-14 NOTE — Discharge Instructions (Addendum)
 Your evaluated for your skin tear  Start back taking your Plavix  which is your blood thinner today, do not stop taking your blood thinner unless instructed by a medical provider  Wound has been cleansed here in the clinic and Steri-Strips have been applied keep area as clean as possible  Steri-Strips will naturally fall off with time, do not pull or pick at the area, may trim off the excess as they start to fall off naturally  Cleanse over the area with unscented water once a day, easiest to do during normal hygiene, may leave area open to the air  May apply topical antibiotic ointment per preference  If you begin to notice redness, swelling or puslike drainage please return to clinic for reevaluation

## 2024-06-28 ENCOUNTER — Encounter: Payer: Self-pay | Admitting: Emergency Medicine

## 2024-06-28 ENCOUNTER — Other Ambulatory Visit: Payer: Self-pay

## 2024-06-28 ENCOUNTER — Emergency Department: Admission: EM | Admit: 2024-06-28 | Discharge: 2024-06-28 | Disposition: A | Discharge status: Home / Self Care

## 2024-06-28 ENCOUNTER — Emergency Department

## 2024-06-28 DIAGNOSIS — I252 Old myocardial infarction: Secondary | ICD-10-CM | POA: Diagnosis not present

## 2024-06-28 DIAGNOSIS — N390 Urinary tract infection, site not specified: Secondary | ICD-10-CM | POA: Insufficient documentation

## 2024-06-28 DIAGNOSIS — R531 Weakness: Secondary | ICD-10-CM | POA: Diagnosis not present

## 2024-06-28 DIAGNOSIS — B9620 Unspecified Escherichia coli [E. coli] as the cause of diseases classified elsewhere: Secondary | ICD-10-CM | POA: Insufficient documentation

## 2024-06-28 DIAGNOSIS — Z87440 Personal history of urinary (tract) infections: Secondary | ICD-10-CM | POA: Insufficient documentation

## 2024-06-28 DIAGNOSIS — I350 Nonrheumatic aortic (valve) stenosis: Secondary | ICD-10-CM | POA: Insufficient documentation

## 2024-06-28 DIAGNOSIS — Z79899 Other long term (current) drug therapy: Secondary | ICD-10-CM | POA: Diagnosis not present

## 2024-06-28 DIAGNOSIS — I2510 Atherosclerotic heart disease of native coronary artery without angina pectoris: Secondary | ICD-10-CM | POA: Diagnosis not present

## 2024-06-28 DIAGNOSIS — R55 Syncope and collapse: Secondary | ICD-10-CM | POA: Insufficient documentation

## 2024-06-28 LAB — URINALYSIS, COMPLETE (UACMP) WITH MICROSCOPIC
Bilirubin Urine: NEGATIVE
Glucose, UA: NEGATIVE mg/dL
Hgb urine dipstick: NEGATIVE
Ketones, ur: NEGATIVE mg/dL
Nitrite: POSITIVE — AB
Protein, ur: NEGATIVE mg/dL
Specific Gravity, Urine: 1.01 (ref 1.005–1.030)
WBC, UA: >50 WBC/hpf (ref 0–5)
pH: 6 (ref 5.0–8.0)

## 2024-06-28 LAB — BASIC METABOLIC PANEL WITH GFR
Anion gap: 9 (ref 5–15)
BUN: 21 mg/dL (ref 8–23)
CO2: 26 mmol/L (ref 22–32)
Calcium: 8.9 mg/dL (ref 8.9–10.3)
Chloride: 102 mmol/L (ref 98–111)
Creatinine, Ser: 0.54 mg/dL (ref 0.44–1.00)
GFR, Estimated: >60 mL/min
Glucose, Bld: 120 mg/dL — ABNORMAL HIGH (ref 70–99)
Potassium: 3.5 mmol/L (ref 3.5–5.1)
Sodium: 138 mmol/L (ref 135–145)

## 2024-06-28 LAB — HEPATIC FUNCTION PANEL
ALT: 9 U/L (ref 0–44)
AST: 20 U/L (ref 15–41)
Albumin: 3.9 g/dL (ref 3.5–5.0)
Alkaline Phosphatase: 62 U/L (ref 38–126)
Bilirubin, Direct: 0.2 mg/dL (ref 0.0–0.2)
Indirect Bilirubin: 0.3 mg/dL (ref 0.3–0.9)
Total Bilirubin: 0.5 mg/dL (ref 0.0–1.2)
Total Protein: 6.8 g/dL (ref 6.5–8.1)

## 2024-06-28 LAB — CBC WITH DIFFERENTIAL/PLATELET
Abs Immature Granulocytes: 0.04 K/uL (ref 0.00–0.07)
Basophils Absolute: 0.1 K/uL (ref 0.0–0.1)
Basophils Relative: 1 %
Eosinophils Absolute: 0.1 K/uL (ref 0.0–0.5)
Eosinophils Relative: 1 %
HCT: 38.5 % (ref 36.0–46.0)
Hemoglobin: 12.4 g/dL (ref 12.0–15.0)
Immature Granulocytes: 1 %
Lymphocytes Relative: 17 %
Lymphs Abs: 1.4 K/uL (ref 0.7–4.0)
MCH: 30.4 pg (ref 26.0–34.0)
MCHC: 32.2 g/dL (ref 30.0–36.0)
MCV: 94.4 fL (ref 80.0–100.0)
Monocytes Absolute: 0.4 K/uL (ref 0.1–1.0)
Monocytes Relative: 5 %
Neutro Abs: 6.2 K/uL (ref 1.7–7.7)
Neutrophils Relative %: 75 %
Platelets: 246 K/uL (ref 150–400)
RBC: 4.08 MIL/uL (ref 3.87–5.11)
RDW: 14 % (ref 11.5–15.5)
WBC: 8.1 K/uL (ref 4.0–10.5)
nRBC: 0 % (ref 0.0–0.2)

## 2024-06-28 LAB — RESP PANEL BY RT-PCR (RSV, FLU A&B, COVID)  RVPGX2
Influenza A by PCR: NEGATIVE
Influenza B by PCR: NEGATIVE
Resp Syncytial Virus by PCR: NEGATIVE
SARS Coronavirus 2 by RT PCR: NEGATIVE

## 2024-06-28 LAB — D-DIMER, QUANTITATIVE: D-Dimer, Quant: 0.53 ug{FEU}/mL — ABNORMAL HIGH (ref 0.00–0.50)

## 2024-06-28 LAB — PRO BRAIN NATRIURETIC PEPTIDE: Pro Brain Natriuretic Peptide: 399 pg/mL — ABNORMAL HIGH

## 2024-06-28 LAB — TROPONIN T, HIGH SENSITIVITY: Troponin T High Sensitivity: <15 ng/L (ref 0–19)

## 2024-06-28 MED ORDER — CEPHALEXIN 500 MG PO CAPS: 500.0000 mg | ORAL_CAPSULE | Freq: Four times a day (QID) | ORAL | 0 refills | Status: DC

## 2024-06-28 MED ORDER — SODIUM CHLORIDE 0.9 % IV SOLN
1.0000 g | Freq: Once | INTRAVENOUS | Status: AC
  Administered 2024-06-28: 1 g via INTRAVENOUS
  Filled 2024-06-28: qty 10

## 2024-06-28 NOTE — Discharge Instructions (Addendum)
 You was seen in the emergency department for a near syncopal episode in the setting of a urinary tract infection.  Workup today was reassuring and you are safe to return home.  Continue your regular medications.  Your next dose of antibiotics will be due this evening.  Please follow-up with your primary care physician as soon as possible.  Return with any acutely worsening symptoms or any other emergency. -- RETURN PRECAUTIONS & AFTERCARE: (ENGLISH) RETURN PRECAUTIONS: Return immediately to the emergency department or see/call your doctor if you feel worse, weak or have changes in speech or vision, are short of breath, have fever, vomiting, pain, bleeding or dark stool, trouble urinating or any new issues. Return here or see/call your doctor if not improving as expected for your suspected condition. FOLLOW-UP CARE: Call your doctor and/or any doctors we referred you to for more advice and to make an appointment. Do this today, tomorrow or after the weekend. Some doctors only take PPO insurance so if you have HMO insurance you may want to contact your HMO or your regular doctor for referral to a specialist within your plan. Either way tell the doctor's office that it was a referral from the emergency department so you get the soonest possible appointment.  YOUR TEST RESULTS: Take result reports of any blood or urine tests, imaging tests and EKG's to your doctor and any referral doctor. Have any abnormal tests repeated. Your doctor or a referral doctor can let you know when this should be done. Also make sure your doctor contacts this hospital to get any test results that are not currently available such as cultures or special tests for infection and final imaging reports, which are often not available at the time you leave the ER but which may list additional important findings that are not documented on the preliminary report. BLOOD PRESSURE: If your blood pressure was greater than 120/80 have your blood  pressure rechecked within 1 to 2 weeks. MEDICATION SIDE EFFECTS: Do not drive, walk, bike, take the bus, etc. if you have received or are being prescribed any sedating medications such as those for pain or anxiety or certain antihistamines like Benadryl. If you have been give one of these here get a taxi home or have a friend drive you home. Ask your pharmacist to counsel you on potential side effects of any new medication

## 2024-06-28 NOTE — ED Provider Notes (Signed)
 "  [facility] Provider Note    Event Date/Time   First MD Initiated Contact with Patient 06/28/24 1338     (approximate)   History   Weakness (PT to ER via EMS from home for C/O feel like Im going to die EMS states family said they noticed slurred speech; no neuro symptoms noted on EMS arrival)   HPI  Alexis Perez is a 89 y.o. female thyroid mass, hydronephrosis on the right, aortic stenosis high blood pressure, CAD with history of NSTEMI who presents to the emergency department with a feeling of imminent dread and near syncope.  Patient states that she was feeling well yesterday and today woke up feeling generally weak.  Denies any chest pain or shortness of breath.  When family checked on home they noticed that she had some slurred speech.  Patient was able to ambulate to the EMS truck with her walker.  She had no focal neurological deficits.  Currently she has no complaints but it is states that she just feels weak.  Daughter was at bedside and confirms patient is at her baseline mental status and denies any current slurred speech.  Daughter states that patient often gets this way when she has a UTI.  Patient has a DNR/DNI and would not like to be hospitalized if possible.       Physical Exam   Triage Vital Signs: ED Triage Vitals  Encounter Vitals Group     BP      Girls Systolic BP Percentile      Girls Diastolic BP Percentile      Boys Systolic BP Percentile      Boys Diastolic BP Percentile      Pulse      Resp      Temp      Temp src      SpO2      Weight      Height      Head Circumference      Peak Flow      Pain Score      Pain Loc      Pain Education      Exclude from Growth Chart     Most recent vital signs: Vitals:   06/28/24 1340 06/28/24 1400  BP: (!) 168/90 (!) 155/83  Pulse: (!) 106 (!) 105  Resp: 20 (!) 22  Temp: 98.1 F (36.7 C)   SpO2: 98% 100%    Nursing Triage Note reviewed. Vital signs reviewed and patients  oxygen saturation is normoxic  General: Patient is well nourished, well developed, awake and alert, resting comfortably in no acute distress Head: Normocephalic and atraumatic Eyes: Normal inspection, extraocular muscles intact, no conjunctival pallor Ear, nose, throat: Normal external exam Neck: Normal range of motion Respiratory: Patient is in no respiratory distress, lungs CTAB Cardiovascular: Patient is not tachycardic, RRR without murmur appreciated GI: Abd SNT with no guarding or rebound  Back: Normal inspection of the back with good strength and range of motion throughout all ext Extremities: pulses intact with good cap refills, no LE pitting edema or calf tenderness Neuro: The patient is alert and oriented to person, place, and time, appropriately conversive, with 5/5 bilat UE/LE strength, no gross motor or sensory defects noted. Coordination appears to be adequate. Skin: Warm, dry, and intact Psych: normal mood and affect, no SI or HI  ED Results / Procedures / Treatments   Labs (all labs ordered are listed, but only abnormal results are displayed)  Labs Reviewed  BASIC METABOLIC PANEL WITH GFR - Abnormal; Notable for the following components:      Result Value   Glucose, Bld 120 (*)    All other components within normal limits  PRO BRAIN NATRIURETIC PEPTIDE - Abnormal; Notable for the following components:   Pro Brain Natriuretic Peptide 399.0 (*)    All other components within normal limits  URINALYSIS, COMPLETE (UACMP) WITH MICROSCOPIC - Abnormal; Notable for the following components:   Color, Urine YELLOW (*)    APPearance CLOUDY (*)    Nitrite POSITIVE (*)    Leukocytes,Ua LARGE (*)    Bacteria, UA RARE (*)    All other components within normal limits  D-DIMER, QUANTITATIVE - Abnormal; Notable for the following components:   D-Dimer, Quant 0.53 (*)    All other components within normal limits  RESP PANEL BY RT-PCR (RSV, FLU A&B, COVID)  [id number]  URINE CULTURE  CBC  WITH DIFFERENTIAL/PLATELET  HEPATIC FUNCTION PANEL  TROPONIN T, HIGH SENSITIVITY     EKG EKG and rhythm strip are interpreted by myself:   EKG: [Normal sinus rhythm] at heart rate of 100, normal QRS duration, QTc 457, nonspecific ST segments and T waves no ectopy EKG not consistent with Acute STEMI Rhythm strip: NSR in lead II   RADIOLOGY Xray chest: No acute abnormality on my independent review interpretation radiologist agrees   PROCEDURES:  Critical Care performed: No  Procedures   MEDICATIONS ORDERED IN ED: Medications  cefTRIAXone  (ROCEPHIN ) 1 g in sodium chloride  0.9 % 100 mL IVPB (1 g Intravenous New Bag/Given 06/28/24 1515)     IMPRESSION / MDM / ASSESSMENT AND PLAN / ED COURSE                                Differential diagnosis includes, but is not limited to, arrhythmia, anemia, electrolyte derangement, UTI, PE, CHF exacerbation, pneumonia, URI  ED course: Patient presents with a feeling of intermittent dread and no other symptoms per her report.  She has no focal neurological deficits on my examination.  Although this does not rule out TIA I suspect this is less likely given what is reported.  EKG demonstrated no arrhythmia and troponin was not elevated.  BNP was not elevated as well.  D-dimer does age-adjusted and patient is satting well on room air.  She had no profound electrolyte derangements no acute anemia.  Urinalysis was consistent with UTI and she received a dose of ceftriaxone  at our facility and I will discharge the patient with 5 days of Keflex  as her urine cultures in the past have been pansensitive.  Patient has consumed p.o. and feels much improved and feels comfortable returning home.  All questions answered and patient and daughter voiced understanding and requested discharge home   Clinical Course as of 06/28/24 1542  Sun Jun 28, 2024  1423 Daughter arrives at bedside and states that patient gets this way when she has UTIs.  Patient amenable  to straight cath [HD]  1443 Urinalysis, Complete w Microscopic -Urine, Clean Catch(!) Consistent with UTI will add on a urine culture [HD]  1444 D-Dimer, Quant(!): 0.53 This does age adjust [HD]  1444 Urinalysis is consistent with UTI.  On review of the urine culture, it was pansensitive and grew out E. coli [HD]  1445 CBC with Differential No leukocytosis [HD]  1445 Basic metabolic panel(!) No profound electrolyte derangements [HD]  1452 Patient will try eating  something.  She feels much improved.  Family feels comfortable with patient returning home once chest x-ray is read [HD]    Clinical Course User Index [HD] Nicholaus Rolland BRAVO, MD   At time of discharge there is no evidence of acute life, limb, vision, or fertility threat. Patient has stable vital signs, pain is well controlled, patient is ambulatory and p.o. tolerant.  Discharge instructions were completed using the EPIC system. I would refer you to those at this time. All warnings prescriptions follow-up etc. were discussed in detail with the patient. Patient indicates understanding and is agreeable with this plan. All questions answered.  Patient is made aware that they may return to the emergency department for any worsening or new condition or for any other emergency.   -- Risk: 5 This patient has a high risk of morbidity due to further diagnostic testing or treatment. Rationale: This patients evaluation and management involve a high risk of morbidity due to the potential severity of presenting symptoms, need for diagnostic testing, and/or initiation of treatment that may require close monitoring. The differential includes conditions with potential for significant deterioration or requiring escalation of care. Treatment decisions in the ED, including medication administration, procedural interventions, or disposition planning, reflect this level of risk. COPA: 5 The patient has the following acute or chronic illness/injury that  poses a possible threat to life or bodily function: [X] : The patient has a potentially serious acute condition or an acute exacerbation of a chronic illness requiring urgent evaluation and management in the Emergency Department. The clinical presentation necessitates immediate consideration of life-threatening or function-threatening diagnoses, even if they are ultimately ruled out.   FINAL CLINICAL IMPRESSION(S) / ED DIAGNOSES   Final diagnoses:  Near syncope  Urinary tract infection without hematuria, site unspecified     Rx / DC Orders   ED Discharge Orders          Ordered    cephALEXin  (KEFLEX ) 500 MG capsule  4 times daily        06/28/24 1520             Note:  This document was prepared using Dragon voice recognition software and may include unintentional dictation errors.   Nicholaus Rolland BRAVO, MD 06/28/24 [phone]  "

## 2024-07-01 LAB — URINE CULTURE: Culture: >=100000 — AB

## 2024-07-06 ENCOUNTER — Ambulatory Visit (INDEPENDENT_AMBULATORY_CARE_PROVIDER_SITE_OTHER)

## 2024-07-06 VITALS — BP 148/78 | HR 69 | Ht 60.0 in | Wt 135.2 lb

## 2024-07-06 DIAGNOSIS — I10 Essential (primary) hypertension: Secondary | ICD-10-CM | POA: Diagnosis not present

## 2024-07-06 DIAGNOSIS — N390 Urinary tract infection, site not specified: Secondary | ICD-10-CM | POA: Insufficient documentation

## 2024-07-06 MED ORDER — FOSFOMYCIN TROMETHAMINE 3 G PO PACK: 3.0000 g | PACK | Freq: Every day | ORAL | 0 refills | Status: AC

## 2024-07-06 NOTE — Progress Notes (Signed)
 "           Progress Note  Physician: Parris DELENA Juneau, MD   HPI: Alexis Perez is a 89 y.o. female presenting on 07/06/2024 for Follow-up .  Discussed the use of AI scribe software for clinical note transcription with the patient, who gave verbal consent to proceed.  History of Present Illness   Alexis Perez is a 89 year old female with recurrent urinary tract infections who presents for follow-up after a recent emergency department visit.  Urinary tract infection - Recurrent urinary tract infections, often not recognized until symptoms become severe - Recent emergency department visit for UTI presenting with extreme weakness - Diagnosed with UTI and treated with cephalexin , 4 pills daily for 5 days - No current urinary retention; able to urinate without difficulty  - Urology seen - she has hydronephrosis and some degree of  ureter stenosis   Antibiotic side effects - Developed diarrhea following cephalexin  treatment - Diarrhea was severe but occurred only once daily - No diarrhea present today - Taking probiotics to manage diarrhea - Allergy to macrodantin  with severe reactions including difficulty breathing - No other antibiotic allergies  General health status - No dizziness, lightheadedness, shortness of breath, or chest pain - Blood pressure is stable - Good appetite and energy level - Engages in regular physical activity, walking almost a mile twice a week with her daughter         Medical history:  Relevant past medical, surgical, family and social history reviewed and updated as indicated. Interim medical history since our last visit reviewed.  Allergies and medications reviewed and updated.   ROS: Negative unless specifically indicated above in HPI.   Current Medications[1]       Objective:     BP (!) 148/78 (BP Location: Left Arm, Patient Position: Sitting, Cuff Size: Normal)   Pulse 69   Ht 5' (1.524 m)   Wt 135 lb 3.2 oz (61.3 kg)   SpO2 96%    BMI 26.40 kg/m   Wt Readings from Last 3 Encounters:  07/06/24 135 lb 3.2 oz (61.3 kg)  06/28/24 136 lb 11 oz (62 kg)  05/05/24 135 lb 2 oz (61.3 kg)    Physical Exam  Physical Exam Vitals reviewed.  Constitutional:      Appearance: Normal appearance. Well-developed with normal weight.  Cardiovascular:     Rate and Rhythm: Normal rate and regular rhythm. Normal heart sounds. Normal peripheral pulses Pulmonary:     Normal breath sounds with normal effort Skin:    General: Skin is warm and dry without noticeable rash. Neurological:     General: No focal deficit present.  Psychiatric:        Mood and Affect: Mood, behavior and cognition normal      Assessment & Plan:  No diagnosis found.  No orders of the defined types were placed in this encounter.    Assessment and Plan    Recurrent Urinary Tract Infections (UTIs) Recurrent UTIs with risk of severe complications due to age and urinary retention. Fosfomycin  chosen for prophylaxis due to tolerability and dosing schedule. - Prescribe fosfomycin  once every 10 days. - Monitor for adverse reactions to fosfomycin . - Nurse to call in two weeks to assess progress.  Hypertension Managed by cardiologist with no current issues. Continue with amlodipine , metoprolol    CAD - Stable on current regime.  She continues on plavix , imdur .  No LE edema.  Cardiology seen Nov 2025  General Health Maintenance Engages in regular  physical activity. Cautious about falls due to age and family history.  Follow-up Emphasized importance of monitoring UTI prophylactic treatment and regular follow-up. - Schedule follow-up appointments every three months. - Nurse to call in two weeks to assess response to fosfomycin . - Ensure medication schedule is tracked on calendar.                 [1]  Current Outpatient Medications:    amLODipine  (NORVASC ) 5 MG tablet, Take 1 tablet (5 mg total) by mouth daily., Disp: 90 tablet, Rfl: 1   aspirin   EC 81 MG tablet, Take 81 mg by mouth daily. Swallow whole., Disp: , Rfl:    Cholecalciferol  50 MCG (2000 UT) CAPS, Take 1 capsule by mouth daily., Disp: , Rfl:    clopidogrel  (PLAVIX ) 75 MG tablet, Take 1 tablet (75 mg total) by mouth daily., Disp: 90 tablet, Rfl: 1   ezetimibe  (ZETIA ) 10 MG tablet, Take 10 mg by mouth at bedtime., Disp: , Rfl:    fosfomycin  (MONUROL ) 3 g PACK, Take 3 g by mouth daily. 1 packet every 10 days, Disp: 3 g, Rfl: 0   isosorbide  mononitrate (IMDUR ) 30 MG 24 hr tablet, Take 120 mg by mouth daily., Disp: , Rfl:    metoprolol  succinate (TOPROL -XL) 100 MG 24 hr tablet, Take 1 tablet (100 mg total) by mouth at bedtime., Disp: 90 tablet, Rfl: 1   Multiple Vitamins-Minerals (PRESERVISION AREDS 2) CAPS, Take 1 capsule by mouth daily., Disp: , Rfl:    nitroGLYCERIN  (NITROSTAT ) 0.4 MG SL tablet, Place 1 tablet (0.4 mg total) under the tongue every 5 (five) minutes as needed for chest pain., Disp: 25 tablet, Rfl: 3   estradiol  (ESTRACE  VAGINAL) 0.1 MG/GM vaginal cream, Apply 0.5mg  (pea-sized amount)  just inside the vaginal introitus with a finger-tip on Monday, Wednesday and Friday nights., Disp: 30 g, Rfl: 12  "

## 2024-07-11 ENCOUNTER — Other Ambulatory Visit: Payer: Self-pay | Admitting: Student

## 2024-07-27 ENCOUNTER — Ambulatory Visit: Admitting: Podiatry

## 2024-08-04 ENCOUNTER — Ambulatory Visit: Admitting: Podiatry

## 2024-08-04 DIAGNOSIS — M79675 Pain in left toe(s): Secondary | ICD-10-CM

## 2024-08-04 DIAGNOSIS — M79674 Pain in right toe(s): Secondary | ICD-10-CM | POA: Diagnosis not present

## 2024-08-04 DIAGNOSIS — B351 Tinea unguium: Secondary | ICD-10-CM

## 2024-08-04 NOTE — Progress Notes (Signed)
"  °  Subjective:  Patient ID: Alexis Perez, female    DOB: 07-Feb-1924,  MRN: [id number]  Chief Complaint  Patient presents with   Nail Problem    89 y.o. female presents with the above complaint. History confirmed with patient.  Her nails are elongated and thickened and she has difficulty cutting them they cause pain in her shoes leneva elongated.  Objective:  Physical Exam: warm, good capillary refill, no trophic changes or ulcerative lesions, normal DP and PT pulses, normal sensory exam, and onychomycosis with dystrophy of right hallux nail, remaining nails are elongated. Assessment:   No diagnosis found.    Plan:  Patient was evaluated and treated and all questions answered.  Discussed the etiology and treatment options for the condition in detail with the patient. Recommended debridement of the nails today. Sharp and mechanical debridement performed of all painful and mycotic nails today. Nails debrided in length and thickness using a nail nipper to level of comfort.     No follow-ups on file.   "

## 2024-11-02 ENCOUNTER — Ambulatory Visit: Admitting: Podiatry
